# Patient Record
Sex: Male | Born: 1950
Health system: Southern US, Community
[De-identification: ages and names within clinical notes are randomized; demographics above are authoritative.]

## PROBLEM LIST (undated history)

## (undated) DIAGNOSIS — I714 Abdominal aortic aneurysm, without rupture, unspecified: Secondary | ICD-10-CM

## (undated) DIAGNOSIS — J449 Chronic obstructive pulmonary disease, unspecified: Secondary | ICD-10-CM

## (undated) DIAGNOSIS — I10 Essential (primary) hypertension: Secondary | ICD-10-CM

## (undated) DIAGNOSIS — K901 Tropical sprue: Secondary | ICD-10-CM

## (undated) DIAGNOSIS — K219 Gastro-esophageal reflux disease without esophagitis: Secondary | ICD-10-CM

## (undated) DIAGNOSIS — R0902 Hypoxemia: Secondary | ICD-10-CM

## (undated) DIAGNOSIS — K635 Polyp of colon: Secondary | ICD-10-CM

## (undated) DIAGNOSIS — K5090 Crohn's disease, unspecified, without complications: Secondary | ICD-10-CM

## (undated) DIAGNOSIS — J439 Emphysema, unspecified: Secondary | ICD-10-CM

## (undated) DIAGNOSIS — T7840XA Allergy, unspecified, initial encounter: Secondary | ICD-10-CM

## (undated) DIAGNOSIS — H3320 Serous retinal detachment, unspecified eye: Secondary | ICD-10-CM

## (undated) HISTORY — PX: TONSILLECTOMY: SUR1361

## (undated) HISTORY — DX: Polyp of colon: K63.5

## (undated) HISTORY — DX: Chronic obstructive pulmonary disease, unspecified: J44.9

## (undated) HISTORY — PX: INGUINAL HERNIA REPAIR: SUR1180

## (undated) HISTORY — DX: Crohn's disease, unspecified, without complications: K50.90

## (undated) HISTORY — DX: Gastro-esophageal reflux disease without esophagitis: K21.9

## (undated) HISTORY — DX: Essential (primary) hypertension: I10

## (undated) HISTORY — PX: COLONOSCOPY: SHX174

## (undated) HISTORY — DX: Emphysema, unspecified: J43.9

## (undated) HISTORY — DX: Tropical sprue: K90.1

## (undated) HISTORY — PX: EYE SURGERY: SHX253

## (undated) HISTORY — PX: SMALL INTESTINE SURGERY: SHX150

## (undated) HISTORY — DX: Serous retinal detachment, unspecified eye: H33.20

## (undated) HISTORY — DX: Allergy, unspecified, initial encounter: T78.40XA

## (undated) HISTORY — PX: COLON SURGERY: SHX602

## (undated) HISTORY — PX: APPENDECTOMY: SHX54

## (undated) HISTORY — DX: Hypoxemia: R09.02

---

## 1994-05-27 HISTORY — PX: BOWEL RESECTION: SHX1257

## 2006-07-27 HISTORY — PX: RETINAL DETACHMENT SURGERY: SHX105

## 2006-08-11 ENCOUNTER — Ambulatory Visit (HOSPITAL_COMMUNITY): Admission: RE | Admit: 2006-08-11 | Discharge: 2006-08-11 | Payer: Self-pay | Admitting: Internal Medicine

## 2006-09-26 HISTORY — PX: RETINAL DETACHMENT SURGERY: SHX105

## 2007-01-25 HISTORY — PX: RETINAL DETACHMENT SURGERY: SHX105

## 2007-07-11 ENCOUNTER — Emergency Department (HOSPITAL_COMMUNITY): Admission: EM | Admit: 2007-07-11 | Discharge: 2007-07-11 | Payer: Self-pay | Admitting: Emergency Medicine

## 2010-12-09 ENCOUNTER — Other Ambulatory Visit (HOSPITAL_COMMUNITY): Payer: Self-pay | Admitting: Internal Medicine

## 2010-12-09 ENCOUNTER — Encounter (HOSPITAL_COMMUNITY): Payer: Self-pay

## 2010-12-09 ENCOUNTER — Ambulatory Visit (HOSPITAL_COMMUNITY)
Admission: RE | Admit: 2010-12-09 | Discharge: 2010-12-09 | Disposition: A | Discharge status: Home / Self Care | Payer: 59 | Source: Ambulatory Visit | Attending: Internal Medicine | Admitting: Internal Medicine

## 2010-12-09 DIAGNOSIS — M79672 Pain in left foot: Secondary | ICD-10-CM

## 2010-12-09 DIAGNOSIS — R937 Abnormal findings on diagnostic imaging of other parts of musculoskeletal system: Secondary | ICD-10-CM | POA: Insufficient documentation

## 2010-12-09 DIAGNOSIS — T1490XA Injury, unspecified, initial encounter: Secondary | ICD-10-CM

## 2010-12-09 DIAGNOSIS — M25579 Pain in unspecified ankle and joints of unspecified foot: Secondary | ICD-10-CM | POA: Insufficient documentation

## 2011-10-27 DIAGNOSIS — H2702 Aphakia, left eye: Secondary | ICD-10-CM | POA: Insufficient documentation

## 2011-10-27 DIAGNOSIS — H33022 Retinal detachment with multiple breaks, left eye: Secondary | ICD-10-CM | POA: Insufficient documentation

## 2011-10-27 DIAGNOSIS — IMO0002 Reserved for concepts with insufficient information to code with codable children: Secondary | ICD-10-CM | POA: Insufficient documentation

## 2012-11-01 ENCOUNTER — Telehealth: Payer: Self-pay

## 2012-11-01 NOTE — Telephone Encounter (Signed)
Called pt. He said he is overdue for colonoscopy. Hx of polyps but not sure what kind or when last one was done. Scheduled for OV with Lorenza Burton, NP on 11/19/2012 at 11:00 AM.

## 2012-11-01 NOTE — Telephone Encounter (Signed)
Received referral from [facility] that said pt was referred to Dr. Jena Gauss for check up ( did not say for what) and possible colonoscopy. Medical records did not have anything on the patient.

## 2012-11-19 ENCOUNTER — Ambulatory Visit (INDEPENDENT_AMBULATORY_CARE_PROVIDER_SITE_OTHER): Payer: 59 | Admitting: Urgent Care

## 2012-11-19 ENCOUNTER — Encounter: Payer: Self-pay | Admitting: Urgent Care

## 2012-11-19 VITALS — BP 134/82 | HR 71 | Temp 98.0°F | Ht 66.0 in | Wt 142.2 lb

## 2012-11-19 DIAGNOSIS — Z860100 Personal history of colon polyps, unspecified: Secondary | ICD-10-CM | POA: Insufficient documentation

## 2012-11-19 MED ORDER — PEG 3350-KCL-NA BICARB-NACL 420 G PO SOLR: 4000.0000 mL | ORAL | Status: DC

## 2012-11-19 NOTE — Assessment & Plan Note (Addendum)
Martin Santiago is a pleasant 62 y.o. male with history of colonic polyps many years ago. He believes last colonoscopy was over 10 years ago. Interestingly, he gives history of possible Crohn's disease with surgical remission back in 1995. Colonoscopy with Dr. Jena Gauss in the near future.  I have discussed risks & benefits which include, but are not limited to, bleeding, infection, perforation & drug reaction.  The patient agrees with this plan & written consent will be obtained.    We will request your last colonoscopy report from [facility] gastroenterology 1-800-QUIT-NOW for help quitting smoking

## 2012-11-19 NOTE — Progress Notes (Signed)
Referring Provider:  Dr Catalina Pizza Primary Care Physician:  Dr Catalina Pizza Primary Gastroenterologist:  Dr. Jena Gauss  Chief Complaint  Patient presents with  . Colonoscopy    Hx colon polyps    HPI:  Martin Santiago is a 62 y.o. male here as a referral from Dr. Catalina Pizza for colonoscopy.  He gives history of multiple polyps removed several years ago on last colonoscopy, however he cannot remember timing of last colonoscopy. He tells me he is significantly overdue. He had a diagnostic laparoscopy & bowel resection back in 1995 and was told he had Crohn's disease.  He reportedly was on Pentasa for a limited period of time and received monthly B12 injections. Over the past 10-15 years, he has had no further problems with GI concerns and he has not been on any GI medications. He has rare nocturnal heartburn a couple times per month.  No certain foods.  He does admit to eating a snack late just before bed.  He keeps TUMS at his bedside and they do work well for him.    Denies nausea, vomiting, dysphagia, odynophagia or anorexia.  Denies constipation, diarrhea, rectal bleeding, melena or weight loss.   Past Medical History  Diagnosis Date  . Hypertension   . Tropical sprue   . Detached retina   . Colon polyps   . Crohn's disease     surgical remission?    Past Surgical History  Procedure Laterality Date  . Bowel resection  05/1994    [place]:  ? Crohn's  . Colonoscopy      Dr Clide Dales, [facility]:  Hx polyps. Cannot remmeber date  . Retinal detachment surgery  07/2006  . Retinal detachment surgery  09/2006  . Retinal detachment surgery  01/2007  . Inguinal hernia repair Right     Current Outpatient Prescriptions  Medication Sig Dispense Refill  . amLODipine (NORVASC) 10 MG tablet Take 10 mg by mouth daily.       Marland Kitchen losartan (COZAAR) 100 MG tablet Take 100 mg by mouth daily.       . polyethylene glycol-electrolytes (TRILYTE) 420 G solution Take 4,000 mLs by mouth as directed.  4000 mL  0   No  current facility-administered medications for this visit.    Allergies as of 11/19/2012  . (No Known Allergies)    Family History:There is no known family history in 1st degree relatives of colorectal carcinoma , liver disease, or inflammatory bowel disease.  Problem Relation Age of Onset  . Colon cancer Maternal Aunt   . Breast cancer Mother     History   Social History  . Marital Status: Married    Spouse Name: N/A    Number of Children: 0  . Years of Education: N/A   Occupational History  . retired, Counsellor, Neurosurgeon    Social History Main Topics  . Smoking status: Current Every Day Smoker -- 1.50 packs/day for 45 years    Types: Cigarettes  . Smokeless tobacco: Not on file  . Alcohol Use: Yes     Comment: Rare, 3-4 per yr  . Drug Use: No     Comment: remote marijuana  . Sexually Active: Not on file   Other Topics Concern  . Not on file   Social History Narrative   Lives w/ wife & step-son (43)     Review of Systems: Gen: Denies any fever, chills, sweats, anorexia, fatigue, weakness, malaise, weight loss, and sleep disorder CV: Denies chest pain, angina,  palpitations, syncope, orthopnea, PND, peripheral edema, and claudication. Resp: Denies dyspnea at rest, dyspnea with exercise, cough, sputum, wheezing, coughing up blood, and pleurisy. GI: Denies vomiting blood, jaundice, and fecal incontinence.   Denies dysphagia or odynophagia. GU : Denies urinary burning, blood in urine, urinary frequency, urinary hesitancy, nocturnal urination, and urinary incontinence. MS: Denies joint pain, limitation of movement, and swelling, stiffness, low back pain, extremity pain. Denies muscle weakness, cramps, atrophy.  Derm: Denies rash, itching, dry skin, hives, moles, warts, or unhealing ulcers.  Psych: Denies depression, anxiety, memory loss, suicidal ideation, hallucinations, paranoia, and confusion. Heme: Denies bruising, bleeding, and enlarged lymph nodes. Neuro:   Denies any headaches, dizziness, paresthesias. Endo:  Denies any problems with DM, thyroid, adrenal function.  Physical Exam: BP 134/82  Pulse 71  Temp(Src) 98 F (36.7 C) (Oral)  Ht 5\' 6"  (1.676 m)  Wt 142 lb 3.2 oz (64.501 kg)  BMI 22.96 kg/m2 No LMP for male patient. General:   Alert,  Well-developed, well-nourished, pleasant and cooperative in NAD Head:  Normocephalic and atraumatic. Eyes:  Sclera clear, no icterus.   Conjunctiva pink. Ears:  Normal auditory acuity. Nose:  No deformity, discharge, or lesions. Mouth:  No deformity or lesions,oropharynx pink & moist. Neck:  Supple; no masses or thyromegaly. Lungs:  Clear throughout to auscultation.   No wheezes, crackles, or rhonchi. No acute distress. Heart:  Regular rate and rhythm; no murmurs, clicks, rubs,  or gallops. Abdomen:  Normal bowel sounds.  No bruits.  Soft, non-tender and non-distended without masses, hepatosplenomegaly or hernias noted.  No guarding or rebound tenderness.   Rectal:  Deferred. Msk:  Symmetrical without gross deformities. Normal posture. Pulses:  Normal pulses noted. Extremities:  + clubbing.  No edema. Neurologic:  Alert and oriented x4;  grossly normal neurologically. Skin:  Intact without significant lesions or rashes. Lymph Nodes:  No significant cervical adenopathy. Psych:  Alert and cooperative. Normal mood and affect.

## 2012-11-19 NOTE — Patient Instructions (Addendum)
Colonoscopy with Dr. Jena Gauss We will request your last colonoscopy report from [facility] gastroenterology 1-800-QUIT-NOW for help quitting smoking  It was a pleasure meeting you, call if you have any problems or questions

## 2012-11-20 ENCOUNTER — Encounter (HOSPITAL_COMMUNITY): Payer: Self-pay | Admitting: Pharmacy Technician

## 2012-11-20 NOTE — Progress Notes (Signed)
Faxed to PCP

## 2012-11-30 NOTE — Progress Notes (Signed)
Note from [facility]-No colon or path report in archives as it was too long ago.

## 2012-12-03 ENCOUNTER — Encounter (HOSPITAL_COMMUNITY): Payer: Self-pay | Admitting: *Deleted

## 2012-12-03 ENCOUNTER — Ambulatory Visit (HOSPITAL_COMMUNITY)
Admission: RE | Admit: 2012-12-03 | Discharge: 2012-12-03 | Disposition: A | Discharge status: Home / Self Care | Payer: 59 | Source: Ambulatory Visit | Attending: Internal Medicine | Admitting: Internal Medicine

## 2012-12-03 ENCOUNTER — Encounter (HOSPITAL_COMMUNITY)
Admission: RE | Disposition: A | Discharge status: Home / Self Care | Payer: Self-pay | Source: Ambulatory Visit | Attending: Internal Medicine

## 2012-12-03 DIAGNOSIS — Z860100 Personal history of colon polyps, unspecified: Secondary | ICD-10-CM | POA: Insufficient documentation

## 2012-12-03 DIAGNOSIS — D126 Benign neoplasm of colon, unspecified: Secondary | ICD-10-CM | POA: Insufficient documentation

## 2012-12-03 DIAGNOSIS — I10 Essential (primary) hypertension: Secondary | ICD-10-CM | POA: Insufficient documentation

## 2012-12-03 DIAGNOSIS — Z9049 Acquired absence of other specified parts of digestive tract: Secondary | ICD-10-CM | POA: Insufficient documentation

## 2012-12-03 DIAGNOSIS — K5730 Diverticulosis of large intestine without perforation or abscess without bleeding: Secondary | ICD-10-CM | POA: Insufficient documentation

## 2012-12-03 DIAGNOSIS — Z1211 Encounter for screening for malignant neoplasm of colon: Secondary | ICD-10-CM

## 2012-12-03 HISTORY — PX: COLONOSCOPY: SHX5424

## 2012-12-03 SURGERY — COLONOSCOPY
Anesthesia: Moderate Sedation

## 2012-12-03 MED ORDER — ONDANSETRON HCL 4 MG/2ML IJ SOLN
INTRAMUSCULAR | Status: AC
  Filled 2012-12-03: qty 2

## 2012-12-03 MED ORDER — MIDAZOLAM HCL 5 MG/5ML IJ SOLN
INTRAMUSCULAR | Status: DC | PRN
  Administered 2012-12-03 (×2): 1 mg via INTRAVENOUS
  Administered 2012-12-03: 2 mg via INTRAVENOUS

## 2012-12-03 MED ORDER — SODIUM CHLORIDE 0.45 % IV SOLN
INTRAVENOUS | Status: DC
  Administered 2012-12-03: 07:00:00 via INTRAVENOUS

## 2012-12-03 MED ORDER — MIDAZOLAM HCL 5 MG/5ML IJ SOLN
INTRAMUSCULAR | Status: AC
  Filled 2012-12-03: qty 10

## 2012-12-03 MED ORDER — MEPERIDINE HCL 100 MG/ML IJ SOLN
INTRAMUSCULAR | Status: AC
  Filled 2012-12-03: qty 2

## 2012-12-03 MED ORDER — ONDANSETRON HCL 4 MG/2ML IJ SOLN
INTRAMUSCULAR | Status: DC | PRN
  Administered 2012-12-03: 4 mg via INTRAVENOUS

## 2012-12-03 MED ORDER — MEPERIDINE HCL 100 MG/ML IJ SOLN
INTRAMUSCULAR | Status: DC | PRN
  Administered 2012-12-03 (×2): 25 mg via INTRAVENOUS
  Administered 2012-12-03: 50 mg via INTRAVENOUS

## 2012-12-03 MED ORDER — SIMETHICONE 40 MG/0.6ML PO SUSP
ORAL | Status: DC | PRN
  Administered 2012-12-03: 07:00:00

## 2012-12-03 NOTE — Op Note (Signed)
[facility] [address] [place], [address]   COLONOSCOPY PROCEDURE REPORT  PATIENT: Martin Santiago, Martin Santiago  MR#:         [id number] BIRTHDATE: July 03, 1951 , 61  yrs. old GENDER: Male ENDOSCOPIST: R.  Roetta Sessions, MD FACP FACG REFERRED BY:  Catalina Pizza, M.D. PROCEDURE DATE:  12/03/2012 PROCEDURE:     Ileocolonoscopy with snare polypectomy  INDICATIONS: Distant history of colonic polyps-overdue for surveillance examination; reported history of surgical resection for Crohn's.  INFORMED CONSENT:  The risks, benefits, alternatives and imponderables including but not limited to bleeding, perforation as well as the possibility of a missed lesion have been reviewed.  The potential for biopsy, lesion removal, etc. have also been discussed.  Questions have been answered.  All parties agreeable. Please see the history and physical in the medical record for more information.  MEDICATIONS: Versed 4 mg IV and Demerol 100 mg IV in divided doses. Zofran 4 mg IV  DESCRIPTION OF PROCEDURE:  After a digital rectal exam was performed, the EC-3890Li ([id number])  colonoscope was advanced from the anus through the rectum and colon to the area of the cecum, ileocecal valve and appendiceal orifice.  The cecum was deeply intubated.  These structures were well-seen and photographed for the record.  From the level of the cecum and ileocecal valve, the scope was slowly and cautiously withdrawn.  The mucosal surfaces were carefully surveyed utilizing scope tip deflection to facilitate fold flattening as needed.  The scope was pulled down into the rectum where a thorough examination including retroflexion was performed.    FINDINGS:  Adequate preparation. Normal rectum (minimal internal hemorrhoids).  scattered sigmoid diverticula; (1) 5 mm polyp in the mid ascending and (1) 6 mm sessile polyp in the mid descending segment; otherwise, the remainder of the colonic mucosa to the cecum appeared  normal. Appendiceal orifice was identified. Patient had intact ileocecal valve.  The distal 10 cm of terminal ileal mucosa appeared normal.   THERAPEUTIC / DIAGNOSTIC MANEUVERS PERFORMED:  The 2 polyps seenwere hot snare removed  COMPLICATIONS: none  CECAL WITHDRAWAL TIME:  19 minutes  IMPRESSION:  Colonic diverticulosis. Colonic polyps-removed described above. No evidence of prior ileocolonic surgery and no evidence of ileocolonic Crohn's disease on today's examination.  RECOMMENDATIONS: Followup on pathology.   _______________________________ eSigned:  R. Roetta Sessions, MD FACP [place] 12/03/2012 9:03 AM   CC:    PATIENT NAME:  Martin Santiago, Martin Santiago MR#: [id number]

## 2012-12-03 NOTE — Interval H&P Note (Signed)
History and Physical Interval Note:  12/03/2012 7:46 AM  Martin Santiago  has presented today for surgery, with the diagnosis of HISTORY OF COLON POLYPS  The various methods of treatment have been discussed with the patient and family. After consideration of risks, benefits and other options for treatment, the patient has consented to  Procedure(s) with comments: COLONOSCOPY (N/A) - 7:30 as a surgical intervention .  The patient's history has been reviewed, patient examined, no change in status, stable for surgery.  I have reviewed the patient's chart and labs.  Questions were answered to the patient's satisfaction.     Eula Listen  Colonoscopy per plan.. The risks, benefits, limitations, alternatives and imponderables have been reviewed with the patient. Questions have been answered. All parties are agreeable.

## 2012-12-03 NOTE — H&P (View-Only) (Signed)
Referring Provider:  Dr Zach Hall Primary Care Physician:  Dr Zach Hall Primary Gastroenterologist:  Dr. Rourk  Chief Complaint  Patient presents with  . Colonoscopy    Hx colon polyps    HPI:  Martin Santiago is a 62 y.o. male here as a referral from Dr. Zach Hall for colonoscopy.  He gives history of multiple polyps removed several years ago on last colonoscopy, however he cannot remember timing of last colonoscopy. He tells me he is significantly overdue. He had a diagnostic laparoscopy & bowel resection back in 1995 and was told he had Crohn's disease.  He reportedly was on Pentasa for a limited period of time and received monthly B12 injections. Over the past 10-15 years, he has had no further problems with GI concerns and he has not been on any GI medications. He has rare nocturnal heartburn a couple times per month.  No certain foods.  He does admit to eating a snack late just before bed.  He keeps TUMS at his bedside and they do work well for him.    Denies nausea, vomiting, dysphagia, odynophagia or anorexia.  Denies constipation, diarrhea, rectal bleeding, melena or weight loss.   Past Medical History  Diagnosis Date  . Hypertension   . Tropical sprue   . Detached retina   . Colon polyps   . Crohn's disease     surgical remission?    Past Surgical History  Procedure Laterality Date  . Bowel resection  05/1994    [place]:  ? Crohn's  . Colonoscopy      Dr Reuben, [facility]:  Hx polyps. Cannot remmeber date  . Retinal detachment surgery  07/2006  . Retinal detachment surgery  09/2006  . Retinal detachment surgery  01/2007  . Inguinal hernia repair Right     Current Outpatient Prescriptions  Medication Sig Dispense Refill  . amLODipine (NORVASC) 10 MG tablet Take 10 mg by mouth daily.       . losartan (COZAAR) 100 MG tablet Take 100 mg by mouth daily.       . polyethylene glycol-electrolytes (TRILYTE) 420 G solution Take 4,000 mLs by mouth as directed.  4000 mL  0   No  current facility-administered medications for this visit.    Allergies as of 11/19/2012  . (No Known Allergies)    Family History:There is no known family history in 1st degree relatives of colorectal carcinoma , liver disease, or inflammatory bowel disease.  Problem Relation Age of Onset  . Colon cancer Maternal Aunt   . Breast cancer Mother     History   Social History  . Marital Status: Married    Spouse Name: N/A    Number of Children: 0  . Years of Education: N/A   Occupational History  . retired, printer, church organist    Social History Main Topics  . Smoking status: Current Every Day Smoker -- 1.50 packs/day for 45 years    Types: Cigarettes  . Smokeless tobacco: Not on file  . Alcohol Use: Yes     Comment: Rare, 3-4 per yr  . Drug Use: No     Comment: remote marijuana  . Sexually Active: Not on file   Other Topics Concern  . Not on file   Social History Narrative   Lives w/ wife & step-son (43)     Review of Systems: Gen: Denies any fever, chills, sweats, anorexia, fatigue, weakness, malaise, weight loss, and sleep disorder CV: Denies chest pain, angina,   palpitations, syncope, orthopnea, PND, peripheral edema, and claudication. Resp: Denies dyspnea at rest, dyspnea with exercise, cough, sputum, wheezing, coughing up blood, and pleurisy. GI: Denies vomiting blood, jaundice, and fecal incontinence.   Denies dysphagia or odynophagia. GU : Denies urinary burning, blood in urine, urinary frequency, urinary hesitancy, nocturnal urination, and urinary incontinence. MS: Denies joint pain, limitation of movement, and swelling, stiffness, low back pain, extremity pain. Denies muscle weakness, cramps, atrophy.  Derm: Denies rash, itching, dry skin, hives, moles, warts, or unhealing ulcers.  Psych: Denies depression, anxiety, memory loss, suicidal ideation, hallucinations, paranoia, and confusion. Heme: Denies bruising, bleeding, and enlarged lymph nodes. Neuro:   Denies any headaches, dizziness, paresthesias. Endo:  Denies any problems with DM, thyroid, adrenal function.  Physical Exam: BP 134/82  Pulse 71  Temp(Src) 98 F (36.7 C) (Oral)  Ht 5' 6" (1.676 m)  Wt 142 lb 3.2 oz (64.501 kg)  BMI 22.96 kg/m2 No LMP for male patient. General:   Alert,  Well-developed, well-nourished, pleasant and cooperative in NAD Head:  Normocephalic and atraumatic. Eyes:  Sclera clear, no icterus.   Conjunctiva pink. Ears:  Normal auditory acuity. Nose:  No deformity, discharge, or lesions. Mouth:  No deformity or lesions,oropharynx pink & moist. Neck:  Supple; no masses or thyromegaly. Lungs:  Clear throughout to auscultation.   No wheezes, crackles, or rhonchi. No acute distress. Heart:  Regular rate and rhythm; no murmurs, clicks, rubs,  or gallops. Abdomen:  Normal bowel sounds.  No bruits.  Soft, non-tender and non-distended without masses, hepatosplenomegaly or hernias noted.  No guarding or rebound tenderness.   Rectal:  Deferred. Msk:  Symmetrical without gross deformities. Normal posture. Pulses:  Normal pulses noted. Extremities:  + clubbing.  No edema. Neurologic:  Alert and oriented x4;  grossly normal neurologically. Skin:  Intact without significant lesions or rashes. Lymph Nodes:  No significant cervical adenopathy. Psych:  Alert and cooperative. Normal mood and affect.  

## 2012-12-05 ENCOUNTER — Encounter: Payer: Self-pay | Admitting: *Deleted

## 2012-12-05 ENCOUNTER — Encounter (HOSPITAL_COMMUNITY): Payer: Self-pay | Admitting: Internal Medicine

## 2012-12-05 ENCOUNTER — Encounter: Payer: Self-pay | Admitting: Internal Medicine

## 2014-09-29 ENCOUNTER — Ambulatory Visit (HOSPITAL_COMMUNITY)
Admission: RE | Admit: 2014-09-29 | Discharge: 2014-09-29 | Disposition: A | Discharge status: Home / Self Care | Payer: No Typology Code available for payment source | Source: Ambulatory Visit | Attending: Internal Medicine | Admitting: Internal Medicine

## 2014-09-29 ENCOUNTER — Other Ambulatory Visit (HOSPITAL_COMMUNITY): Payer: Self-pay | Admitting: Internal Medicine

## 2014-09-29 DIAGNOSIS — R609 Edema, unspecified: Secondary | ICD-10-CM

## 2014-09-29 DIAGNOSIS — M25532 Pain in left wrist: Secondary | ICD-10-CM | POA: Insufficient documentation

## 2014-09-29 DIAGNOSIS — M7989 Other specified soft tissue disorders: Secondary | ICD-10-CM | POA: Insufficient documentation

## 2014-09-29 DIAGNOSIS — R52 Pain, unspecified: Secondary | ICD-10-CM

## 2014-09-29 DIAGNOSIS — M13842 Other specified arthritis, left hand: Secondary | ICD-10-CM | POA: Insufficient documentation

## 2015-01-01 DIAGNOSIS — H40002 Preglaucoma, unspecified, left eye: Secondary | ICD-10-CM | POA: Diagnosis not present

## 2015-01-01 DIAGNOSIS — H2511 Age-related nuclear cataract, right eye: Secondary | ICD-10-CM | POA: Diagnosis not present

## 2015-01-01 DIAGNOSIS — H33022 Retinal detachment with multiple breaks, left eye: Secondary | ICD-10-CM | POA: Diagnosis not present

## 2015-01-01 DIAGNOSIS — H2702 Aphakia, left eye: Secondary | ICD-10-CM | POA: Diagnosis not present

## 2015-07-20 DIAGNOSIS — I10 Essential (primary) hypertension: Secondary | ICD-10-CM | POA: Diagnosis not present

## 2015-07-22 DIAGNOSIS — Z23 Encounter for immunization: Secondary | ICD-10-CM | POA: Diagnosis not present

## 2015-07-22 DIAGNOSIS — Z720 Tobacco use: Secondary | ICD-10-CM | POA: Diagnosis not present

## 2015-07-22 DIAGNOSIS — I10 Essential (primary) hypertension: Secondary | ICD-10-CM | POA: Diagnosis not present

## 2015-09-03 DIAGNOSIS — D225 Melanocytic nevi of trunk: Secondary | ICD-10-CM | POA: Diagnosis not present

## 2015-09-03 DIAGNOSIS — L821 Other seborrheic keratosis: Secondary | ICD-10-CM | POA: Diagnosis not present

## 2015-09-03 DIAGNOSIS — I781 Nevus, non-neoplastic: Secondary | ICD-10-CM | POA: Diagnosis not present

## 2015-09-03 DIAGNOSIS — B079 Viral wart, unspecified: Secondary | ICD-10-CM | POA: Diagnosis not present

## 2015-12-07 ENCOUNTER — Other Ambulatory Visit (HOSPITAL_COMMUNITY): Payer: Self-pay | Admitting: Internal Medicine

## 2015-12-07 ENCOUNTER — Ambulatory Visit (HOSPITAL_COMMUNITY)
Admission: RE | Admit: 2015-12-07 | Discharge: 2015-12-07 | Disposition: A | Discharge status: Home / Self Care | Payer: Medicare Other | Source: Ambulatory Visit | Attending: Internal Medicine | Admitting: Internal Medicine

## 2015-12-07 DIAGNOSIS — M419 Scoliosis, unspecified: Secondary | ICD-10-CM | POA: Insufficient documentation

## 2015-12-07 DIAGNOSIS — R05 Cough: Secondary | ICD-10-CM

## 2015-12-07 DIAGNOSIS — R0602 Shortness of breath: Secondary | ICD-10-CM | POA: Diagnosis present

## 2015-12-07 DIAGNOSIS — R059 Cough, unspecified: Secondary | ICD-10-CM

## 2015-12-07 DIAGNOSIS — M545 Low back pain, unspecified: Secondary | ICD-10-CM

## 2015-12-07 DIAGNOSIS — M5136 Other intervertebral disc degeneration, lumbar region: Secondary | ICD-10-CM | POA: Insufficient documentation

## 2015-12-07 DIAGNOSIS — R0902 Hypoxemia: Secondary | ICD-10-CM | POA: Diagnosis not present

## 2015-12-07 DIAGNOSIS — M79605 Pain in left leg: Secondary | ICD-10-CM

## 2015-12-07 DIAGNOSIS — Z720 Tobacco use: Secondary | ICD-10-CM | POA: Diagnosis not present

## 2015-12-07 DIAGNOSIS — J449 Chronic obstructive pulmonary disease, unspecified: Secondary | ICD-10-CM | POA: Insufficient documentation

## 2015-12-07 DIAGNOSIS — R509 Fever, unspecified: Secondary | ICD-10-CM | POA: Diagnosis not present

## 2015-12-07 DIAGNOSIS — J441 Chronic obstructive pulmonary disease with (acute) exacerbation: Secondary | ICD-10-CM | POA: Diagnosis not present

## 2015-12-07 DIAGNOSIS — I959 Hypotension, unspecified: Secondary | ICD-10-CM | POA: Diagnosis not present

## 2015-12-15 ENCOUNTER — Other Ambulatory Visit (HOSPITAL_COMMUNITY): Payer: Self-pay | Admitting: Internal Medicine

## 2015-12-15 DIAGNOSIS — R9389 Abnormal findings on diagnostic imaging of other specified body structures: Secondary | ICD-10-CM

## 2015-12-28 DIAGNOSIS — Z720 Tobacco use: Secondary | ICD-10-CM | POA: Diagnosis not present

## 2015-12-28 DIAGNOSIS — J441 Chronic obstructive pulmonary disease with (acute) exacerbation: Secondary | ICD-10-CM | POA: Diagnosis not present

## 2015-12-28 DIAGNOSIS — Z716 Tobacco abuse counseling: Secondary | ICD-10-CM | POA: Diagnosis not present

## 2015-12-28 DIAGNOSIS — R0902 Hypoxemia: Secondary | ICD-10-CM | POA: Diagnosis not present

## 2016-01-07 DIAGNOSIS — H4052X3 Glaucoma secondary to other eye disorders, left eye, severe stage: Secondary | ICD-10-CM | POA: Diagnosis not present

## 2016-01-07 DIAGNOSIS — H33022 Retinal detachment with multiple breaks, left eye: Secondary | ICD-10-CM | POA: Diagnosis not present

## 2016-01-07 DIAGNOSIS — H2702 Aphakia, left eye: Secondary | ICD-10-CM | POA: Diagnosis not present

## 2016-01-07 DIAGNOSIS — H2511 Age-related nuclear cataract, right eye: Secondary | ICD-10-CM | POA: Diagnosis not present

## 2016-01-13 ENCOUNTER — Ambulatory Visit (HOSPITAL_COMMUNITY)
Admission: RE | Admit: 2016-01-13 | Discharge: 2016-01-13 | Disposition: A | Discharge status: Home / Self Care | Payer: Medicare Other | Source: Ambulatory Visit | Attending: Internal Medicine | Admitting: Internal Medicine

## 2016-01-13 DIAGNOSIS — J439 Emphysema, unspecified: Secondary | ICD-10-CM | POA: Diagnosis not present

## 2016-01-13 DIAGNOSIS — R938 Abnormal findings on diagnostic imaging of other specified body structures: Secondary | ICD-10-CM | POA: Insufficient documentation

## 2016-01-13 DIAGNOSIS — R918 Other nonspecific abnormal finding of lung field: Secondary | ICD-10-CM | POA: Insufficient documentation

## 2016-01-13 DIAGNOSIS — D3501 Benign neoplasm of right adrenal gland: Secondary | ICD-10-CM | POA: Diagnosis not present

## 2016-01-13 DIAGNOSIS — I2510 Atherosclerotic heart disease of native coronary artery without angina pectoris: Secondary | ICD-10-CM | POA: Insufficient documentation

## 2016-01-13 DIAGNOSIS — J432 Centrilobular emphysema: Secondary | ICD-10-CM | POA: Insufficient documentation

## 2016-01-13 DIAGNOSIS — R9389 Abnormal findings on diagnostic imaging of other specified body structures: Secondary | ICD-10-CM

## 2016-02-25 ENCOUNTER — Ambulatory Visit (INDEPENDENT_AMBULATORY_CARE_PROVIDER_SITE_OTHER): Payer: Medicare Other | Admitting: Cardiology

## 2016-02-25 ENCOUNTER — Encounter: Payer: Self-pay | Admitting: Cardiology

## 2016-02-25 VITALS — BP 150/100 | HR 41 | Ht 68.0 in | Wt 133.0 lb

## 2016-02-25 DIAGNOSIS — Z136 Encounter for screening for cardiovascular disorders: Secondary | ICD-10-CM

## 2016-02-25 DIAGNOSIS — Z720 Tobacco use: Secondary | ICD-10-CM

## 2016-02-25 DIAGNOSIS — I10 Essential (primary) hypertension: Secondary | ICD-10-CM

## 2016-02-25 DIAGNOSIS — I2510 Atherosclerotic heart disease of native coronary artery without angina pectoris: Secondary | ICD-10-CM | POA: Diagnosis not present

## 2016-02-25 NOTE — Patient Instructions (Signed)
Your physician wants you to follow-up in: 1 year You will receive a reminder letter in the mail two months in advance. If you don't receive a letter, please call our office to schedule the follow-up appointment.    Your physician recommends that you continue on your current medications as directed. Please refer to the Current Medication list given to you today.   If you need a refill on your cardiac medications before your next appointment, please call your pharmacy.   Your physician has requested that you have a lexiscan myoview. For further information please visit [email or web address]. Please follow instruction sheet, as given.     Thank you for choosing [facility] !

## 2016-02-25 NOTE — Progress Notes (Signed)
Cardiology Office Note  Date: 02/25/2016   ID: Martin Santiago, DOB 1951-06-14, MRN [id number]  PCP: Wende Neighbors, MD  Consulting Cardiologist: Rozann Lesches, MD   Chief Complaint  Patient presents with  . Coronary atherosclerosis by chest CT    History of Present Illness: Martin Santiago is a 65 y.o. male referred for cardiology consultation by Dr. Nevada Crane. He had a recent chest CT to follow up on COPD exacerbation and suspected pneumonia, and the study reported incidentally noted multivessel distribution coronary atherosclerosis. He states that the respiratory symptoms have improved, and sounds like he was fairly sick for a few weeks with worsening shortness of breath, cough, and hypotension. He was treated as an outpatient, has been off his antihypertensive medications.  He states that he is not particularly active, does not exercise. 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 He reports chronic dyspnea on exertion, generally NYHA class 2-3 depending on level of activity. He does not have any exertional chest discomfort.  He has a long-standing history of tobacco abuse, we discussed this today. He states that he quit for 3 days with hypnosis several years ago, but started smoking again and and has not wanted to quit since then.  ECG today shows sinus rhythm with PVC and PAC, leftward axis.  Past Medical History  Diagnosis Date  . Essential hypertension   . Tropical sprue   . Detached retina   . Colon polyps   . Crohn's disease ([place])   . COPD (chronic obstructive pulmonary disease) [facility])     Past Surgical History  Procedure Laterality Date  . Bowel resection  05/1994    [place]:  ? Crohn's  . Colonoscopy      Dr Derrill Center, [facility]:  Hx polyps. Cannot remmeber date  . Retinal detachment surgery  07/2006  . Retinal detachment surgery  09/2006  . Retinal detachment surgery  01/2007  . Inguinal hernia repair Right     . Colonoscopy N/A 12/03/2012    Procedure: COLONOSCOPY;  Surgeon: Daneil Dolin, MD;  Location: [facility];  Service: Endoscopy;  Laterality: N/A;  7:30    Current Outpatient Prescriptions  Medication Sig Dispense Refill  . aspirin EC 81 MG tablet Take 81 mg by mouth daily.    Marland Kitchen amLODipine (NORVASC) 10 MG tablet Take 10 mg by mouth daily. Reported on 02/25/2016    . losartan (COZAAR) 100 MG tablet Take 100 mg by mouth daily. Reported on 02/25/2016     No current facility-administered medications for this visit.   Allergies:  Codeine   Social History: The patient  reports that he has been smoking Cigarettes.  He has a 67.5 pack-year smoking history. He does not have any smokeless tobacco history on file. He reports that he drinks alcohol. He reports that he does not use illicit drugs.   Family History: The patient's family history includes Breast cancer in his mother; Colon cancer in his maternal aunt.   ROS:  Please see the history of present illness. Otherwise, complete review of systems is positive for chronic dyspnea exertion and seasonal allergies.  All other systems are reviewed and negative.   Physical Exam: VS:  BP 150/100 mmHg  Pulse 41  Ht 5\' 8"  (1.727 m)  Wt 133 lb (60.328 kg)  BMI 20.23 kg/m2  SpO2 91%, BMI Body mass index is 20.23 kg/(m^2).  Wt Readings from Last 3 Encounters:  02/25/16  133 lb (60.328 kg)  12/03/12 142 lb (64.411 kg)  11/19/12 142 lb 3.2 oz (64.501 kg)    General: Thin male, appears comfortable at rest. HEENT: Conjunctiva and lids normal, oropharynx clear. Neck: Supple, no elevated JVP or carotid bruits, no thyromegaly. Lungs: Decreased breath sounds throughout without wheezing, nonlabored breathing at rest. Cardiac: Regular rate and rhythm, no S3 or significant systolic murmur, no pericardial rub. Abdomen: Soft, nontender, bowel sounds present, no guarding or rebound. Extremities: No pitting edema, distal pulses 2+. Skin: Warm and  dry. Musculoskeletal: No kyphosis. Neuropsychiatric: Alert and oriented x3, affect grossly appropriate.  ECG: No old tracing available for comparison.  Other Studies Reviewed Today:  Chest CT 01/13/2016: IMPRESSION: 1. Moderate to severe centrilobular emphysema. No evidence of interstitial lung disease. 2. Basilar subpleural nodules are likely subpleural lymph nodes. Non-contrast chest CT at 3-6 months is recommended. If the nodules are stable at time of repeat CT, then future CT at 18-24 months (from today's scan) is considered optional for low-risk patients, but is recommended for high-risk patients. This recommendation follows the consensus statement: Guidelines for Management of Incidental Pulmonary Nodules Detected on CT Images:From the Fleischner Society 2017; published online before print (10.1148/radiol.SG:5268862). 3. Right adrenal adenoma. 4. Three-vessel coronary artery calcification.  Assessment and Plan:  1. Coronary atherosclerosis based on recent chest CT imaging. Patient has a long-standing history of tobacco abuse, also hypertension. We discussed the implications and I have recommended a Lexiscan Cardiolite to assess ischemic burden and LVEF. I recommended that he continue taking aspirin daily (has not been doing this regularly), and also resume his antihypertensives one at a time since blood pressure has trended back up. He needs to keep follow-up with Dr. Nevada Crane.  2. Long-standing tobacco abuse, patient is very frank and discussing no motivation to quit. I have explained this is a risk factor not only for progressive lung disease but also heart disease.  3. Essential hypertension. Recently off Cozaar and Norvasc due to hypotension with pneumonia. Blood pressure is elevated today. I recommended that he start back on at least one of these medications and then follow-up with Dr. Nevada Crane.  Current medicines were reviewed with the patient today.   Orders Placed This  Encounter  Procedures  . NM Myocar Multi W/Spect W/Wall Motion / EF  . EKG 12-Lead    Disposition: FU with me in 1 year.   Signed, Satira Sark, MD, [facility] 02/25/2016 10:59 AM    [facility] [address], [place], [place] [address] Phone: [phone]; Fax: [phone]

## 2016-03-07 ENCOUNTER — Inpatient Hospital Stay (HOSPITAL_COMMUNITY): Admission: RE | Admit: 2016-03-07 | Payer: Medicare Other | Source: Ambulatory Visit

## 2016-03-07 ENCOUNTER — Encounter (HOSPITAL_COMMUNITY)
Admission: RE | Admit: 2016-03-07 | Discharge: 2016-03-07 | Disposition: A | Discharge status: Home / Self Care | Payer: Medicare Other | Source: Ambulatory Visit | Attending: Cardiology | Admitting: Cardiology

## 2016-03-07 ENCOUNTER — Encounter (HOSPITAL_COMMUNITY): Payer: Self-pay

## 2016-03-07 DIAGNOSIS — R931 Abnormal findings on diagnostic imaging of heart and coronary circulation: Secondary | ICD-10-CM | POA: Insufficient documentation

## 2016-03-07 DIAGNOSIS — I2510 Atherosclerotic heart disease of native coronary artery without angina pectoris: Secondary | ICD-10-CM | POA: Diagnosis not present

## 2016-03-07 LAB — NM MYOCAR MULTI W/SPECT W/WALL MOTION / EF
CHL CUP NUCLEAR SRS: 1
CHL CUP RESTING HR STRESS: 65 {beats}/min
LV dias vol: 79 mL (ref 62–150)
LV sys vol: 35 mL
NUC STRESS TID: 0.99
Peak HR: 102 {beats}/min
RATE: 0.3
SDS: 2
SSS: 3

## 2016-03-07 MED ORDER — SODIUM CHLORIDE 0.9% FLUSH
INTRAVENOUS | Status: AC
  Administered 2016-03-07: 10 mL via INTRAVENOUS
  Filled 2016-03-07: qty 10

## 2016-03-07 MED ORDER — REGADENOSON 0.4 MG/5ML IV SOLN
INTRAVENOUS | Status: AC
  Administered 2016-03-07: 0.4 mg via INTRAVENOUS
  Filled 2016-03-07: qty 5

## 2016-03-07 MED ORDER — TECHNETIUM TC 99M TETROFOSMIN IV KIT
30.0000 | PACK | Freq: Once | INTRAVENOUS | Status: AC | PRN
  Administered 2016-03-07: 30 via INTRAVENOUS

## 2016-03-07 MED ORDER — TECHNETIUM TC 99M TETROFOSMIN IV KIT
10.0000 | PACK | Freq: Once | INTRAVENOUS | Status: AC | PRN
  Administered 2016-03-07: 10 via INTRAVENOUS

## 2016-05-26 DIAGNOSIS — E782 Mixed hyperlipidemia: Secondary | ICD-10-CM | POA: Diagnosis not present

## 2016-05-31 DIAGNOSIS — I10 Essential (primary) hypertension: Secondary | ICD-10-CM | POA: Diagnosis not present

## 2016-05-31 DIAGNOSIS — J449 Chronic obstructive pulmonary disease, unspecified: Secondary | ICD-10-CM | POA: Diagnosis not present

## 2016-05-31 DIAGNOSIS — R0981 Nasal congestion: Secondary | ICD-10-CM | POA: Diagnosis not present

## 2016-05-31 DIAGNOSIS — R05 Cough: Secondary | ICD-10-CM | POA: Diagnosis not present

## 2016-05-31 DIAGNOSIS — Z720 Tobacco use: Secondary | ICD-10-CM | POA: Diagnosis not present

## 2016-05-31 DIAGNOSIS — I25119 Atherosclerotic heart disease of native coronary artery with unspecified angina pectoris: Secondary | ICD-10-CM | POA: Diagnosis not present

## 2017-01-12 DIAGNOSIS — H2702 Aphakia, left eye: Secondary | ICD-10-CM | POA: Diagnosis not present

## 2017-01-12 DIAGNOSIS — H33022 Retinal detachment with multiple breaks, left eye: Secondary | ICD-10-CM | POA: Diagnosis not present

## 2017-01-12 DIAGNOSIS — H4052X3 Glaucoma secondary to other eye disorders, left eye, severe stage: Secondary | ICD-10-CM | POA: Diagnosis not present

## 2017-01-12 DIAGNOSIS — H25811 Combined forms of age-related cataract, right eye: Secondary | ICD-10-CM | POA: Diagnosis not present

## 2017-02-15 DIAGNOSIS — Z720 Tobacco use: Secondary | ICD-10-CM | POA: Diagnosis not present

## 2017-02-15 DIAGNOSIS — I25119 Atherosclerotic heart disease of native coronary artery with unspecified angina pectoris: Secondary | ICD-10-CM | POA: Diagnosis not present

## 2017-02-15 DIAGNOSIS — J449 Chronic obstructive pulmonary disease, unspecified: Secondary | ICD-10-CM | POA: Diagnosis not present

## 2017-02-15 DIAGNOSIS — I10 Essential (primary) hypertension: Secondary | ICD-10-CM | POA: Diagnosis not present

## 2017-02-15 DIAGNOSIS — Z6820 Body mass index (BMI) 20.0-20.9, adult: Secondary | ICD-10-CM | POA: Diagnosis not present

## 2017-02-15 DIAGNOSIS — Z0000 Encounter for general adult medical examination without abnormal findings: Secondary | ICD-10-CM | POA: Diagnosis not present

## 2017-04-21 ENCOUNTER — Other Ambulatory Visit: Payer: Self-pay

## 2017-06-13 ENCOUNTER — Inpatient Hospital Stay (HOSPITAL_COMMUNITY)
Admission: EM | Admit: 2017-06-13 | Discharge: 2017-06-15 | DRG: 190 | Disposition: A | Discharge status: Home / Self Care | Payer: Medicare Other | Attending: Internal Medicine | Admitting: Internal Medicine

## 2017-06-13 ENCOUNTER — Emergency Department (HOSPITAL_COMMUNITY): Payer: Medicare Other

## 2017-06-13 ENCOUNTER — Encounter (HOSPITAL_COMMUNITY): Payer: Self-pay | Admitting: Emergency Medicine

## 2017-06-13 DIAGNOSIS — J441 Chronic obstructive pulmonary disease with (acute) exacerbation: Secondary | ICD-10-CM | POA: Diagnosis not present

## 2017-06-13 DIAGNOSIS — J9601 Acute respiratory failure with hypoxia: Secondary | ICD-10-CM | POA: Diagnosis not present

## 2017-06-13 DIAGNOSIS — Z9049 Acquired absence of other specified parts of digestive tract: Secondary | ICD-10-CM | POA: Diagnosis not present

## 2017-06-13 DIAGNOSIS — J449 Chronic obstructive pulmonary disease, unspecified: Secondary | ICD-10-CM | POA: Diagnosis present

## 2017-06-13 DIAGNOSIS — F17210 Nicotine dependence, cigarettes, uncomplicated: Secondary | ICD-10-CM | POA: Diagnosis present

## 2017-06-13 DIAGNOSIS — Z23 Encounter for immunization: Secondary | ICD-10-CM | POA: Diagnosis not present

## 2017-06-13 DIAGNOSIS — I10 Essential (primary) hypertension: Secondary | ICD-10-CM | POA: Diagnosis present

## 2017-06-13 DIAGNOSIS — Z7982 Long term (current) use of aspirin: Secondary | ICD-10-CM

## 2017-06-13 DIAGNOSIS — R0602 Shortness of breath: Secondary | ICD-10-CM | POA: Diagnosis not present

## 2017-06-13 DIAGNOSIS — K5090 Crohn's disease, unspecified, without complications: Secondary | ICD-10-CM | POA: Diagnosis present

## 2017-06-13 DIAGNOSIS — Z720 Tobacco use: Secondary | ICD-10-CM | POA: Diagnosis present

## 2017-06-13 DIAGNOSIS — Z79899 Other long term (current) drug therapy: Secondary | ICD-10-CM

## 2017-06-13 DIAGNOSIS — J42 Unspecified chronic bronchitis: Secondary | ICD-10-CM | POA: Diagnosis not present

## 2017-06-13 LAB — CBC WITH DIFFERENTIAL/PLATELET
Basophils Absolute: 0 10*3/uL (ref 0.0–0.1)
Basophils Relative: 0 %
EOS ABS: 0.2 10*3/uL (ref 0.0–0.7)
Eosinophils Relative: 2 %
HCT: 48 % (ref 39.0–52.0)
HEMOGLOBIN: 16.2 g/dL (ref 13.0–17.0)
LYMPHS ABS: 1.1 10*3/uL (ref 0.7–4.0)
Lymphocytes Relative: 11 %
MCH: 31.5 pg (ref 26.0–34.0)
MCHC: 33.8 g/dL (ref 30.0–36.0)
MCV: 93.2 fL (ref 78.0–100.0)
Monocytes Absolute: 1 10*3/uL (ref 0.1–1.0)
Monocytes Relative: 10 %
Neutro Abs: 7.7 10*3/uL (ref 1.7–7.7)
Neutrophils Relative %: 77 %
Platelets: 205 10*3/uL (ref 150–400)
RBC: 5.15 MIL/uL (ref 4.22–5.81)
RDW: 14.2 % (ref 11.5–15.5)
WBC: 10 10*3/uL (ref 4.0–10.5)

## 2017-06-13 LAB — COMPREHENSIVE METABOLIC PANEL
ALT: 10 U/L — AB (ref 17–63)
AST: 16 U/L (ref 15–41)
Albumin: 4 g/dL (ref 3.5–5.0)
Alkaline Phosphatase: 67 U/L (ref 38–126)
Anion gap: 10 (ref 5–15)
BUN: 13 mg/dL (ref 6–20)
CALCIUM: 10.1 mg/dL (ref 8.9–10.3)
CO2: 26 mmol/L (ref 22–32)
CREATININE: 0.85 mg/dL (ref 0.61–1.24)
Chloride: 103 mmol/L (ref 101–111)
GFR calc non Af Amer: >60 mL/min (ref >60)
GLUCOSE: 105 mg/dL — AB (ref 65–99)
Potassium: 4.1 mmol/L (ref 3.5–5.1)
SODIUM: 139 mmol/L (ref 135–145)
Total Bilirubin: 0.6 mg/dL (ref 0.3–1.2)
Total Protein: 7.2 g/dL (ref 6.5–8.1)

## 2017-06-13 LAB — TROPONIN I

## 2017-06-13 LAB — BRAIN NATRIURETIC PEPTIDE: B Natriuretic Peptide: 57 pg/mL (ref 0.0–100.0)

## 2017-06-13 MED ORDER — ASPIRIN 325 MG PO TABS: 650.0000 mg | ORAL_TABLET | Freq: Every day | ORAL | Status: DC | PRN

## 2017-06-13 MED ORDER — IPRATROPIUM-ALBUTEROL 0.5-2.5 (3) MG/3ML IN SOLN: 3.0000 mL | RESPIRATORY_TRACT | Status: DC | PRN

## 2017-06-13 MED ORDER — NICOTINE 21 MG/24HR TD PT24
21.0000 mg | MEDICATED_PATCH | Freq: Once | TRANSDERMAL | Status: AC
  Administered 2017-06-13: 21 mg via TRANSDERMAL
  Filled 2017-06-13: qty 1

## 2017-06-13 MED ORDER — CALCIUM CARBONATE ANTACID 500 MG PO CHEW
1.0000 | CHEWABLE_TABLET | Freq: Every day | ORAL | Status: DC
  Administered 2017-06-13 – 2017-06-14 (×2): 200 mg via ORAL
  Filled 2017-06-13 (×2): qty 1

## 2017-06-13 MED ORDER — IPRATROPIUM-ALBUTEROL 0.5-2.5 (3) MG/3ML IN SOLN
3.0000 mL | Freq: Three times a day (TID) | RESPIRATORY_TRACT | Status: DC
  Administered 2017-06-14 – 2017-06-15 (×5): 3 mL via RESPIRATORY_TRACT
  Filled 2017-06-13 (×4): qty 3

## 2017-06-13 MED ORDER — METHYLPREDNISOLONE SODIUM SUCC 125 MG IJ SOLR
125.0000 mg | Freq: Once | INTRAMUSCULAR | Status: AC
  Administered 2017-06-13: 125 mg via INTRAVENOUS
  Filled 2017-06-13: qty 2

## 2017-06-13 MED ORDER — ENOXAPARIN SODIUM 40 MG/0.4ML ~~LOC~~ SOLN: 40.0000 mg | SUBCUTANEOUS | Status: DC

## 2017-06-13 MED ORDER — IPRATROPIUM-ALBUTEROL 0.5-2.5 (3) MG/3ML IN SOLN
3.0000 mL | Freq: Four times a day (QID) | RESPIRATORY_TRACT | Status: DC
  Administered 2017-06-13: 3 mL via RESPIRATORY_TRACT
  Filled 2017-06-13: qty 3

## 2017-06-13 MED ORDER — ONDANSETRON HCL 4 MG PO TABS: 4.0000 mg | ORAL_TABLET | Freq: Four times a day (QID) | ORAL | Status: DC | PRN

## 2017-06-13 MED ORDER — GUAIFENESIN ER 600 MG PO TB12
600.0000 mg | ORAL_TABLET | Freq: Two times a day (BID) | ORAL | Status: DC
  Administered 2017-06-13 – 2017-06-14 (×2): 600 mg via ORAL
  Filled 2017-06-13 (×2): qty 1

## 2017-06-13 MED ORDER — LOSARTAN POTASSIUM 50 MG PO TABS
100.0000 mg | ORAL_TABLET | Freq: Every day | ORAL | Status: DC
  Administered 2017-06-14 – 2017-06-15 (×2): 100 mg via ORAL
  Filled 2017-06-13 (×3): qty 2

## 2017-06-13 MED ORDER — ALBUTEROL (5 MG/ML) CONTINUOUS INHALATION SOLN
10.0000 mg/h | INHALATION_SOLUTION | RESPIRATORY_TRACT | Status: DC
  Administered 2017-06-13: 10 mg/h via RESPIRATORY_TRACT
  Filled 2017-06-13: qty 20

## 2017-06-13 MED ORDER — METHYLPREDNISOLONE SODIUM SUCC 125 MG IJ SOLR
60.0000 mg | Freq: Four times a day (QID) | INTRAMUSCULAR | Status: DC
  Administered 2017-06-13 – 2017-06-15 (×8): 60 mg via INTRAVENOUS
  Filled 2017-06-13 (×8): qty 2

## 2017-06-13 MED ORDER — AMLODIPINE BESYLATE 5 MG PO TABS
10.0000 mg | ORAL_TABLET | Freq: Every day | ORAL | Status: DC
  Administered 2017-06-14 – 2017-06-15 (×2): 10 mg via ORAL
  Filled 2017-06-13 (×2): qty 2

## 2017-06-13 MED ORDER — ENOXAPARIN SODIUM 40 MG/0.4ML ~~LOC~~ SOLN
40.0000 mg | SUBCUTANEOUS | Status: DC
  Administered 2017-06-13 – 2017-06-14 (×2): 40 mg via SUBCUTANEOUS
  Filled 2017-06-13 (×2): qty 0.4

## 2017-06-13 MED ORDER — ALBUTEROL SULFATE (2.5 MG/3ML) 0.083% IN NEBU
5.0000 mg | INHALATION_SOLUTION | Freq: Once | RESPIRATORY_TRACT | Status: AC
  Administered 2017-06-13: 5 mg via RESPIRATORY_TRACT
  Filled 2017-06-13: qty 6

## 2017-06-13 MED ORDER — ONDANSETRON HCL 4 MG/2ML IJ SOLN: 4.0000 mg | Freq: Four times a day (QID) | INTRAMUSCULAR | Status: DC | PRN

## 2017-06-13 MED ORDER — IPRATROPIUM-ALBUTEROL 0.5-2.5 (3) MG/3ML IN SOLN: 3.0000 mL | Freq: Three times a day (TID) | RESPIRATORY_TRACT | Status: DC

## 2017-06-13 NOTE — ED Triage Notes (Signed)
COPD pt, worsen SOB last night, cough - nonproductive, congested,  Pt was 85% on RA in triage, placed on 2L via , now 93%

## 2017-06-13 NOTE — ED Notes (Signed)
Waiting for pt. To finish breathing treatment then reassess

## 2017-06-13 NOTE — ED Notes (Signed)
'  s Pt took [place] off to eat and sats dropped to 85-88%. Have put pt.'s O2 back on and sats are now rising to

## 2017-06-13 NOTE — ED Provider Notes (Signed)
[facility] DEPT Provider Note   CSN: [id number] Arrival date & time: 06/13/17  1021     History   Chief Complaint Chief Complaint  Patient presents with  . Shortness of Breath    HPI Martin Santiago is a 66 y.o. male.  HPI Patient presents with increasing shortness of breath over the last few days. Has had a nonproductive cough. Denies any fever or chills. States he's having difficulty lying flat. He denies having any chest pain or back pain. No new lower extremity swelling. Has history of COPD but has not used any of his inhalers at home. Past Medical History:  Diagnosis Date  . Colon polyps   . COPD (chronic obstructive pulmonary disease) ([place])   . Crohn's disease ([place])   . Detached retina   . Essential hypertension   . Tropical sprue     Patient Active Problem List   Diagnosis Date Noted  . COPD (chronic obstructive pulmonary disease) ([place]) 06/13/2017  . Hx of colonic polyps 11/19/2012    Past Surgical History:  Procedure Laterality Date  . BOWEL RESECTION  05/1994   [facility]:  ? Crohn's  . COLONOSCOPY     Dr Derrill [facility], [facility]:  Hx polyps. Cannot remmeber date  . COLONOSCOPY N/A 12/03/2012   Procedure: COLONOSCOPY;  Surgeon: Daneil Dolin, MD;  Location: [facility];  Service: Endoscopy;  Laterality: N/A;  7:30  . INGUINAL HERNIA REPAIR Right   . RETINAL DETACHMENT SURGERY  07/2006  . RETINAL DETACHMENT SURGERY  09/2006  . RETINAL DETACHMENT SURGERY  01/2007       Home Medications    Prior to Admission medications   Medication Sig Start Date End Date Taking? Authorizing Provider  amLODipine (NORVASC) 10 MG tablet Take 10 mg by mouth daily. Reported on 02/25/2016 11/16/12  Yes [provider]  aspirin buffered (BUFFERIN) 325 MG TABS tablet Take 650 mg by mouth daily as needed (pain).   Yes [provider]  calcium carbonate (TUMS - DOSED IN MG ELEMENTAL CALCIUM) 500 MG chewable tablet Chew 1 tablet by mouth at bedtime.   Yes [provider]  losartan (COZAAR) 100 MG tablet Take 100 mg by mouth daily. Reported on 02/25/2016 11/16/12   [provider]    Family History Family History  Problem Relation Age of Onset  . Colon cancer Maternal Aunt   . Breast cancer Mother     Social History Social History  Substance Use Topics  . Smoking status: Current Every Day Smoker    Packs/day: 1.50    Years: 45.00    Types: Cigarettes  . Smokeless tobacco: Never Used  . Alcohol use 0.0 oz/week     Comment: Rare, 3-4 per yr     Allergies   Codeine   Review of Systems Review of Systems  Constitutional: Negative for chills and fever.  Respiratory: Positive for cough, shortness of breath and wheezing.   Cardiovascular: Negative for chest pain, palpitations and leg swelling.  Gastrointestinal: Negative for abdominal pain, constipation, diarrhea, nausea and vomiting.  Genitourinary: Negative for dysuria, flank pain and hematuria.  Musculoskeletal: Negative for back pain, myalgias, neck pain and neck stiffness.  Skin: Negative for rash and wound.  Neurological: Negative for dizziness, weakness, light-headedness, numbness and headaches.  All other systems reviewed and are negative.    Physical Exam Updated Vital Signs BP (!) 122/92 (BP Location: Right Arm)   Pulse 91   Temp 98.4 F (36.9 C) (Oral)   Resp Marland Kitchen)  29   Ht 5\' 6"  (1.676 m)   Wt 61.2 kg (135 lb)   SpO2 97%   BMI 21.79 kg/m   Physical Exam  Constitutional: He is oriented to person, place, and time. He appears well-developed and well-nourished.  HENT:  Head: Normocephalic and atraumatic.  Mouth/Throat: Oropharynx is clear and moist. No oropharyngeal exudate.  Eyes: Pupils are equal, round, and reactive to light. EOM are normal.  Neck: Normal range of motion. Neck supple.  Cardiovascular: Normal rate and regular rhythm.  Exam reveals no gallop and no friction rub.   No murmur heard. Pulmonary/Chest: Effort normal. He has wheezes. He has  rales.  Leaning forward with increased work of breathing. Scattered expiratory wheezing and crackles in bilateral bases.  Abdominal: Soft. Bowel sounds are normal. There is no tenderness. There is no rebound and no guarding.  Musculoskeletal: Normal range of motion. He exhibits no edema or tenderness.  No lower extremity swelling, asymmetry or tenderness. No midline thoracic or lumbar tenderness. No CVA tenderness bilaterally. Distal pulses are 2+  Neurological: He is alert and oriented to person, place, and time.  Moving all extremities without focal deficit. Sensation intact.  Skin: Skin is warm and dry. Capillary refill takes less than 2 seconds. No rash noted. No erythema.  Psychiatric: He has a normal mood and affect. His behavior is normal.  Nursing note and vitals reviewed.    ED Treatments / Results  Labs (all labs ordered are listed, but only abnormal results are displayed) Labs Reviewed  COMPREHENSIVE METABOLIC PANEL - Abnormal; Notable for the following:       Result Value   Glucose, Bld 105 (*)    ALT 10 (*)    All other components within normal limits  CBC WITH DIFFERENTIAL/PLATELET  BRAIN NATRIURETIC PEPTIDE  TROPONIN I    EKG  EKG Interpretation None       Radiology Dg Chest 2 View  Result Date: 06/13/2017 CLINICAL DATA:  Shortness of breath EXAM: CHEST  2 VIEW COMPARISON:  12/07/2015 FINDINGS: The lungs are hyperinflated likely secondary to COPD. There is no focal parenchymal opacity. There is no pleural effusion or pneumothorax. The heart and mediastinal contours are unremarkable. The osseous structures are unremarkable. IMPRESSION: No active cardiopulmonary disease. Electronically Signed   By: Kathreen Devoid   On: 06/13/2017 12:37    Procedures Procedures (including critical care time)  Medications Ordered in ED Medications  albuterol (PROVENTIL,VENTOLIN) solution continuous neb (not administered)  nicotine (NICODERM CQ - dosed in mg/24 hours) patch 21 mg  (not administered)  methylPREDNISolone sodium succinate (SOLU-MEDROL) 125 mg/2 mL injection 125 mg (125 mg Intravenous Given 06/13/17 1159)  albuterol (PROVENTIL) (2.5 MG/3ML) 0.083% nebulizer solution 5 mg (5 mg Nebulization Given 06/13/17 1149)     Initial Impression / Assessment and Plan / ED Course  I have reviewed the triage vital signs and the nursing notes.  Pertinent labs & imaging results that were available during my care of the patient were reviewed by me and considered in my medical decision making (see chart for details).     Patient still requiring supplemental oxygen despite solumedrol and nebulized treatments. Discussed with hospitalist will see patient in emergency department and admit.  Final Clinical Impressions(s) / ED Diagnoses   Final diagnoses:  COPD exacerbation [facility])    New Prescriptions New Prescriptions   No medications on file     Julianne Rice, MD 06/13/17 1408

## 2017-06-13 NOTE — ED Notes (Signed)
Pt has decided to stay. Will call and give report to [phone]

## 2017-06-13 NOTE — H&P (Signed)
History and Physical    Martin Santiago WJX:[id number] DOB: 1951/07/20 DOA: 06/13/2017  Referring MD/NP/PA: Dr. Lita Mains  PCP: Celene Squibb, MD   Patient coming from: home   Chief Complaint: dyspnea   HPI: Martin Santiago is a 66 y.o. male with known COPD and ongoing tobacco use, presented to [facility] ED with main concern of several days duration of progressively worsening dyspnea that started with exertion and has progressed to dyspnea at rest. This was associated with wheezing and fatigue, poor oral intake. Patient reports cough but he explains that is chronic and mostly non productive, worse in am. Patient denies any chest pain, no fevers, chills, no abd or urinary concerns.   ED Course: Pt was hypoxic and has required oxygen via [place]. Unable to taper off oxygen and requiring admission for further evaluation. Patient also noted to be wheezing and was started on Solumedrol 125 mg IV x 1 dose. TRH asked to admit for further evaluation.   Review of Systems:  Constitutional: Negative for fever, chills, diaphoresis, activity change HENT: Negative for ear pain, nosebleeds, congestion, facial swelling, rhinorrhea, neck pain, neck stiffness and ear discharge.   Eyes: Negative for pain, discharge, redness, itching and visual disturbance.  Respiratory: Per HPI  Cardiovascular: Negative for chest pain, palpitations and leg swelling.  Gastrointestinal: Negative for abdominal distention.  Genitourinary: Negative for dysuria, urgency, frequency, hematuria, flank pain, decreased urine volume, difficulty urinating and dyspareunia.  Musculoskeletal: Negative for back pain, joint swelling, arthralgias and gait problem.  Neurological: Negative for dizziness, tremors, seizures, syncope, facial asymmetry, speech difficulty, weakness, light-headedness, numbness and headaches.  Hematological: Negative for adenopathy. Does not bruise/bleed easily.  Psychiatric/Behavioral: Negative for hallucinations, behavioral  problems, confusion, dysphoric mood, decreased concentration and agitation.   Past Medical History:  Diagnosis Date  . Colon polyps   . COPD (chronic obstructive pulmonary disease) ([place])   . Crohn's disease ([place])   . Detached retina   . Essential hypertension   . Tropical sprue     Past Surgical History:  Procedure Laterality Date  . BOWEL RESECTION  05/1994   [facility]:  ? Crohn's  . COLONOSCOPY     Dr Derrill [facility], [facility]:  Hx polyps. Cannot remmeber date  . COLONOSCOPY N/A 12/03/2012   Procedure: COLONOSCOPY;  Surgeon: Daneil Dolin, MD;  Location[facility];  Service: Endoscopy;  Laterality: N/A;  7:30  . INGUINAL HERNIA REPAIR Right   . RETINAL DETACHMENT SURGERY  07/2006  . RETINAL DETACHMENT SURGERY  09/2006  . RETINAL DETACHMENT SURGERY  01/2007   Social Hx:  reports that he has been smoking Cigarettes.  He has a 67.50 pack-year smoking history. He has never used smokeless tobacco. He reports that he drinks alcohol. He reports that he does not use drugs.  Allergies  Allergen Reactions  . Codeine Nausea And Vomiting and Other (See Comments)    Other reaction(s): Flushing (ALLERGY/intolerance) fainting    Family History  Problem Relation Age of Onset  . Colon cancer Maternal Aunt   . Breast cancer Mother     Medication Sig  amLODipine (NORVASC) 10 MG tablet Take 10 mg by mouth daily. Reported on 02/25/2016  aspirin buffered (BUFFERIN) 325 MG TABS tablet Take 650 mg by mouth daily as needed (pain).  losartan (COZAAR) 100 MG tablet Take 100 mg by mouth daily. Reported on 02/25/2016    Physical Exam: Vitals:   06/13/17 1330 06/13/17 1400 06/13/17 1430 06/13/17 1434  BP: 128/90 124/69 130/80  Pulse: 95 91 92   Resp: 19 (!) 25 (!) 21   Temp:      TempSrc:      SpO2: 92% (!) 88% (!) 88% (!) 88%  Weight:      Height:        Constitutional: NAD, calm, comfortable Vitals:   06/13/17 1330 06/13/17 1400 06/13/17 1430 06/13/17 1434  BP: 128/90 124/69 130/80   Pulse:  95 91 92   Resp: 19 (!) 25 (!) 21   Temp:      TempSrc:      SpO2: 92% (!) 88% (!) 88% (!) 88%  Weight:      Height:       Eyes: PERRL, lids and conjunctivae normal ENMT: Mucous membranes are moist. Posterior pharynx clear of any exudate or lesions.Normal dentition.  Neck: normal, supple, no masses, no thyromegaly Respiratory: course breath sounds bilaterally with exp wheezing  Cardiovascular: Regular rate and rhythm, no murmurs / rubs / gallops. No extremity edema. 2+ pedal pulses. No carotid bruits.  Abdomen: no tenderness, no masses palpated. No hepatosplenomegaly. Bowel sounds positive.  Musculoskeletal: no clubbing / cyanosis. No joint deformity upper and lower extremities. Good ROM, no contractures. Normal muscle tone.  Skin: no rashes, lesions, ulcers. No induration Neurologic: CN 2-12 grossly intact. Sensation intact, DTR normal. Strength 5/5 in all 4.  Psychiatric: Normal judgment and insight. Alert and oriented x 3. Normal mood.   Labs on Admission: I have personally reviewed following labs and imaging studies  CBC:  Recent Labs Lab 06/13/17 1159  WBC 10.0  NEUTROABS 7.7  HGB 16.2  HCT 48.0  MCV 93.2  PLT 440   Basic Metabolic Panel:  Recent Labs Lab 06/13/17 1159  NA 139  K 4.1  CL 103  CO2 26  GLUCOSE 105*  BUN 13  CREATININE 0.85  CALCIUM 10.1   Liver Function Tests:  Recent Labs Lab 06/13/17 1159  AST 16  ALT 10*  ALKPHOS 67  BILITOT 0.6  PROT 7.2  ALBUMIN 4.0   Cardiac Enzymes:  Recent Labs Lab 06/13/17 1159  TROPONINI <0.03   Radiological Exams on Admission: Dg Chest 2 View  Result Date: 06/13/2017 CLINICAL DATA:  Shortness of breath EXAM: CHEST  2 VIEW COMPARISON:  12/07/2015 FINDINGS: The lungs are hyperinflated likely secondary to COPD. There is no focal parenchymal opacity. There is no pleural effusion or pneumothorax. The heart and mediastinal contours are unremarkable. The osseous structures are unremarkable. IMPRESSION: No  active cardiopulmonary disease. Electronically Signed   By: Kathreen Devoid   On: 06/13/2017 12:37   EKG: pending   Assessment/Plan Active Problems:   COPD (chronic obstructive pulmonary disease) ([place]) - admit for further management - place on solumedrol IVF 60 mg Q6 hours and taper down as clinically indicated - place on bronchodilators scheduled and as needed - provide oxygen via [place]    HTN - continue home medical regimen    DVT prophylaxis: Lovenox SQ Code Status: Full  Family Communication: Pt updated at bedside Disposition Plan: admit to tele unit  Consults called: None Admission status: Inpatient   Faye Ramsay MD [facility] Pager [phone]  If 7PM-7AM, please contact night-coverage [email or web address] Password [facility]  06/13/2017, 3:40 PM

## 2017-06-13 NOTE — ED Notes (Signed)
EKG done in triage

## 2017-06-14 DIAGNOSIS — Z720 Tobacco use: Secondary | ICD-10-CM

## 2017-06-14 DIAGNOSIS — J9601 Acute respiratory failure with hypoxia: Secondary | ICD-10-CM | POA: Diagnosis present

## 2017-06-14 DIAGNOSIS — I10 Essential (primary) hypertension: Secondary | ICD-10-CM

## 2017-06-14 DIAGNOSIS — J441 Chronic obstructive pulmonary disease with (acute) exacerbation: Principal | ICD-10-CM

## 2017-06-14 LAB — BASIC METABOLIC PANEL
ANION GAP: 8 (ref 5–15)
BUN: 28 mg/dL — AB (ref 6–20)
CALCIUM: 10.3 mg/dL (ref 8.9–10.3)
CO2: 29 mmol/L (ref 22–32)
CREATININE: 0.99 mg/dL (ref 0.61–1.24)
Chloride: 101 mmol/L (ref 101–111)
GFR calc Af Amer: >60 mL/min (ref >60)
GLUCOSE: 158 mg/dL — AB (ref 65–99)
Potassium: 4 mmol/L (ref 3.5–5.1)
Sodium: 138 mmol/L (ref 135–145)

## 2017-06-14 LAB — CBC
HCT: 45.4 % (ref 39.0–52.0)
Hemoglobin: 15.2 g/dL (ref 13.0–17.0)
MCH: 31.1 pg (ref 26.0–34.0)
MCHC: 33.5 g/dL (ref 30.0–36.0)
MCV: 93 fL (ref 78.0–100.0)
Platelets: 222 10*3/uL (ref 150–400)
RBC: 4.88 MIL/uL (ref 4.22–5.81)
RDW: 14.4 % (ref 11.5–15.5)
WBC: 17.3 10*3/uL — ABNORMAL HIGH (ref 4.0–10.5)

## 2017-06-14 MED ORDER — GUAIFENESIN ER 600 MG PO TB12
1200.0000 mg | ORAL_TABLET | Freq: Two times a day (BID) | ORAL | Status: DC
  Administered 2017-06-14 – 2017-06-15 (×2): 1200 mg via ORAL
  Filled 2017-06-14 (×2): qty 2

## 2017-06-14 MED ORDER — PNEUMOCOCCAL VAC POLYVALENT 25 MCG/0.5ML IJ INJ
0.5000 mL | INJECTION | INTRAMUSCULAR | Status: AC
  Administered 2017-06-15: 0.5 mL via INTRAMUSCULAR
  Filled 2017-06-14: qty 0.5

## 2017-06-14 MED ORDER — NICOTINE 21 MG/24HR TD PT24
21.0000 mg | MEDICATED_PATCH | Freq: Every day | TRANSDERMAL | Status: DC
  Administered 2017-06-14 – 2017-06-15 (×2): 21 mg via TRANSDERMAL
  Filled 2017-06-14 (×2): qty 1

## 2017-06-14 MED ORDER — AZITHROMYCIN 250 MG PO TABS
500.0000 mg | ORAL_TABLET | Freq: Every day | ORAL | Status: AC
  Administered 2017-06-14: 500 mg via ORAL
  Filled 2017-06-14: qty 2

## 2017-06-14 MED ORDER — INFLUENZA VAC SPLIT HIGH-DOSE 0.5 ML IM SUSY
0.5000 mL | PREFILLED_SYRINGE | INTRAMUSCULAR | Status: AC
  Administered 2017-06-15: 0.5 mL via INTRAMUSCULAR
  Filled 2017-06-14: qty 0.5

## 2017-06-14 MED ORDER — ALUM & MAG HYDROXIDE-SIMETH 200-200-20 MG/5ML PO SUSP
30.0000 mL | Freq: Four times a day (QID) | ORAL | Status: DC | PRN
  Administered 2017-06-14 (×2): 30 mL via ORAL
  Filled 2017-06-14 (×2): qty 30

## 2017-06-14 MED ORDER — CHLORHEXIDINE GLUCONATE 0.12 % MT SOLN
15.0000 mL | Freq: Two times a day (BID) | OROMUCOSAL | Status: DC
  Administered 2017-06-14 – 2017-06-15 (×3): 15 mL via OROMUCOSAL
  Filled 2017-06-14 (×3): qty 15

## 2017-06-14 MED ORDER — ORAL CARE MOUTH RINSE
15.0000 mL | Freq: Two times a day (BID) | OROMUCOSAL | Status: DC
Start: 2017-06-14 — End: 2017-06-15

## 2017-06-14 MED ORDER — AZITHROMYCIN 250 MG PO TABS
250.0000 mg | ORAL_TABLET | Freq: Every day | ORAL | Status: DC
  Administered 2017-06-15: 250 mg via ORAL
  Filled 2017-06-14: qty 1

## 2017-06-14 MED ORDER — BUDESONIDE 0.25 MG/2ML IN SUSP
0.2500 mg | Freq: Two times a day (BID) | RESPIRATORY_TRACT | Status: DC
  Administered 2017-06-14 – 2017-06-15 (×2): 0.25 mg via RESPIRATORY_TRACT
  Filled 2017-06-14 (×2): qty 2

## 2017-06-14 NOTE — Progress Notes (Signed)
SATURATION QUALIFICATIONS: (This note is used to comply with regulatory documentation for home oxygen)  Patient Saturations on Room Air at Rest = 90%  Patient Saturations on Room Air while Ambulating = 84%  Patient Saturations on 4 Liters of oxygen while Ambulating = 89%

## 2017-06-14 NOTE — Progress Notes (Signed)
PROGRESS NOTE    Martin Santiago  VEH:[id number] DOB: 1951/03/28 DOA: 06/13/2017 PCP: Celene Squibb, MD    Brief Narrative:  66 year old male with known COPD and tobacco use, presented with worsening shortness of breath due to COPD exacerbation. Currently on intravenous steroids, antibiotics and bronchodilators. Anticipate discharge home the next 1-2 days.   Assessment & Plan:   Active Problems:   COPD (chronic obstructive pulmonary disease) (H[facility])   Acute respiratory failure with hypoxia ([facility])   Tobacco abuse   HTN (hypertension)   1. COPD exacerbation. Feeling better since admission and continue on IV steroids, antibiotics, bronchodilators. 2. Acute respiratory failure with hypoxia. COPD exacerbation. Reports that respiratory status is not quite back to baseline yet. Continue to wean off oxygen as tolerated. 3. Hypertension. Continue on amlodipine and losartan 4. Tobacco use. Nicotine patch   DVT prophylaxis: lovenox Code Status: full Family Communication: discussed with patient Disposition Plan: discharge home once improved   Consultants:     Procedures:     Antimicrobials:   Azithromycin 9/19>    Subjective: Overall feeling better. Shortness of breath is not back to baseline yet.  Objective: Vitals:   06/14/17 0645 06/14/17 0935 06/14/17 1426 06/14/17 1440  BP: 118/71  128/73   Pulse: 87  90   Resp:      Temp: 98 F (36.7 C)  98.1 F (36.7 C)   TempSrc: Oral  Oral   SpO2: 91% 91% 91% (!) 86%  Weight: 60.6 kg (133 lb 8 oz)     Height:        Intake/Output Summary (Last 24 hours) at 06/14/17 1453 Last data filed at 06/13/17 1900  Gross per 24 hour  Intake              240 ml  Output                0 ml  Net              240 ml   Filed Weights   06/13/17 1038 06/13/17 1729 06/14/17 0645  Weight: 61.2 kg (135 lb) 61.2 kg (135 lb) 60.6 kg (133 lb 8 oz)    Examination:  General exam: Appears calm and comfortable  Respiratory system: diminished  breath sounds with mild wheeze bilaterally Cardiovascular system: S1 & S2 heard, RRR. No JVD, murmurs, rubs, gallops or clicks. No pedal edema. Gastrointestinal system: Abdomen is nondistended, soft and nontender. No organomegaly or masses felt. Normal bowel sounds heard. Central nervous system: Alert and oriented. No focal neurological deficits. Extremities: Symmetric 5 x 5 power. Skin: No rashes, lesions or ulcers Psychiatry: Judgement and insight appear normal. Mood & affect appropriate.     Data Reviewed: I have personally reviewed following labs and imaging studies  CBC:  Recent Labs Lab 06/13/17 1159 06/14/17 0705  WBC 10.0 17.3*  NEUTROABS 7.7  --   HGB 16.2 15.2  HCT 48.0 45.4  MCV 93.2 93.0  PLT 205 836   Basic Metabolic Panel:  Recent Labs Lab 06/13/17 1159 06/14/17 0705  NA 139 138  K 4.1 4.0  CL 103 101  CO2 26 29  GLUCOSE 105* 158*  BUN 13 28*  CREATININE 0.85 0.99  CALCIUM 10.1 10.3   GFR: Estimated Creatinine Clearance: 62.1 mL/min (by C-G formula based on SCr of 0.99 mg/dL). Liver Function Tests:  Recent Labs Lab 06/13/17 1159  AST 16  ALT 10*  ALKPHOS 67  BILITOT 0.6  PROT 7.2  ALBUMIN 4.0  No results for input(s): LIPASE, AMYLASE in the last 168 hours. No results for input(s): AMMONIA in the last 168 hours. Coagulation Profile: No results for input(s): INR, PROTIME in the last 168 hours. Cardiac Enzymes:  Recent Labs Lab 06/13/17 1159  TROPONINI <0.03   BNP (last 3 results) No results for input(s): PROBNP in the last 8760 hours. HbA1C: No results for input(s): HGBA1C in the last 72 hours. CBG: No results for input(s): GLUCAP in the last 168 hours. Lipid Profile: No results for input(s): CHOL, HDL, LDLCALC, TRIG, CHOLHDL, LDLDIRECT in the last 72 hours. Thyroid Function Tests: No results for input(s): TSH, T4TOTAL, FREET4, T3FREE, THYROIDAB in the last 72 hours. Anemia Panel: No results for input(s): VITAMINB12, FOLATE,  FERRITIN, TIBC, IRON, RETICCTPCT in the last 72 hours. Sepsis Labs: No results for input(s): PROCALCITON, LATICACIDVEN in the last 168 hours.  No results found for this or any previous visit (from the past 240 hour(s)).       Radiology Studies: Dg Chest 2 View  Result Date: 06/13/2017 CLINICAL DATA:  Shortness of breath EXAM: CHEST  2 VIEW COMPARISON:  12/07/2015 FINDINGS: The lungs are hyperinflated likely secondary to COPD. There is no focal parenchymal opacity. There is no pleural effusion or pneumothorax. The heart and mediastinal contours are unremarkable. The osseous structures are unremarkable. IMPRESSION: No active cardiopulmonary disease. Electronically Signed   By: Kathreen Devoid   On: 06/13/2017 12:37        Scheduled Meds: . amLODipine  10 mg Oral Daily  . [START ON 06/15/2017] azithromycin  250 mg Oral Daily  . budesonide (PULMICORT) nebulizer solution  0.25 mg Nebulization BID  . calcium carbonate  1 tablet Oral QHS  . chlorhexidine  15 mL Mouth Rinse BID  . enoxaparin (LOVENOX) injection  40 mg Subcutaneous Q24H  . guaiFENesin  1,200 mg Oral BID  . [START ON 06/15/2017] Influenza vac split quadrivalent PF  0.5 mL Intramuscular Tomorrow-1000  . ipratropium-albuterol  3 mL Nebulization TID  . losartan  100 mg Oral Daily  . mouth rinse  15 mL Mouth Rinse q12n4p  . methylPREDNISolone (SOLU-MEDROL) injection  60 mg Intravenous Q6H  . nicotine  21 mg Transdermal Daily  . [START ON 06/15/2017] pneumococcal 23 valent vaccine  0.5 mL Intramuscular Tomorrow-1000   Continuous Infusions:   LOS: 1 day    Time spent: 91mins    Naseem Varden, MD [facility] Pager [phone]  If 7PM-7AM, please contact night-coverage [email or web address] Password [email or web address] 06/14/2017, 2:53 PM

## 2017-06-14 NOTE — Care Management Note (Signed)
Case Management Note  Patient Details  Name: Martin Santiago MRN: [id number] Date of Birth: [birth year]  Subjective/Objective:     Adm with COPD. From home, ind with ADL's. No HH or DME PTA. He is acutely on 2L. He smokes, wants to leave now and go outside to smoke.              Action/Plan: Anticipate DC home with self care. Will need oxygen home O2 assessment. CM following.   Expected Discharge Date:       06/14/2017           Expected Discharge Plan:  Home/Self Care  In-House Referral:     Discharge planning Services  CM Consult  Post Acute Care Choice:    Choice offered to:  Patient  DME Arranged:    DME Agency:     HH Arranged:    [facility] Agency:     Status of Service:  In process, will continue to follow  If discussed at Long Length of Stay Meetings, dates discussed:    Additional Comments:  Ismar Yabut, Chauncey [place], RN 06/14/2017, 1:37 PM

## 2017-06-15 MED ORDER — TIOTROPIUM BROMIDE MONOHYDRATE 18 MCG IN CAPS: 18.0000 ug | ORAL_CAPSULE | Freq: Every day | RESPIRATORY_TRACT | 2 refills | Status: DC

## 2017-06-15 MED ORDER — PREDNISONE 10 MG PO TABS: ORAL_TABLET | ORAL | 0 refills | Status: DC

## 2017-06-15 MED ORDER — AZITHROMYCIN 250 MG PO TABS: 250.0000 mg | ORAL_TABLET | Freq: Every day | ORAL | 0 refills | Status: DC

## 2017-06-15 MED ORDER — GUAIFENESIN ER 600 MG PO TB12: 600.0000 mg | ORAL_TABLET | Freq: Two times a day (BID) | ORAL | 0 refills | Status: DC

## 2017-06-15 MED ORDER — ALBUTEROL SULFATE (2.5 MG/3ML) 0.083% IN NEBU: 2.5000 mg | INHALATION_SOLUTION | Freq: Four times a day (QID) | RESPIRATORY_TRACT | 12 refills | Status: DC | PRN

## 2017-06-15 MED ORDER — BUDESONIDE-FORMOTEROL FUMARATE 160-4.5 MCG/ACT IN AERO: 2.0000 | INHALATION_SPRAY | Freq: Two times a day (BID) | RESPIRATORY_TRACT | 12 refills | Status: DC

## 2017-06-15 MED ORDER — NICOTINE 21 MG/24HR TD PT24: 21.0000 mg | MEDICATED_PATCH | Freq: Every day | TRANSDERMAL | 0 refills | Status: DC

## 2017-06-15 MED ORDER — BLOOD GLUCOSE MONITOR KIT: PACK | 0 refills | Status: DC

## 2017-06-15 NOTE — Care Management (Addendum)
    Durable Medical Equipment        Start     Ordered   06/15/17 1516  For home use only DME oxygen  Once    Question Answer Comment  Mode or (Route) Nasal cannula   Liters per Minute 3   Frequency Continuous (stationary and portable oxygen unit needed)   Oxygen conserving device Yes   Oxygen delivery system Gas      06/15/17 1516   06/15/17 1516  For home use only DME Nebulizer machine  Once    Question:  Patient needs a nebulizer to treat with the following condition  Answer:  COPD (chronic obstructive pulmonary disease) ([place])   06/15/17 1516    Patient qualifies for home oxygen. IV steroids, antibiotics, and neb treatments have been tried and failed. Offered choice of DME agency. Patient has no preference. Vaughan Basta of [facility] notified of orders and will obtain from chart. Port tank will be delivered to room prior to discharge along with a nebulizer machine. Patient is eligible for [facility] services, will order [facility] discharge EMMI calls.

## 2017-06-15 NOTE — Progress Notes (Signed)
SATURATION QUALIFICATIONS: (This note is used to comply with regulatory documentation for home oxygen)  Patient Saturations on Room Air at Rest = 90%  Patient Saturations on Room Air while Ambulating = 84%  Patient Saturations on 3 Liters of oxygen while Ambulating = 89%  Please briefly explain why patient needs home oxygen: 

## 2017-06-15 NOTE — Discharge Summary (Signed)
Physician Discharge Summary  Martin Santiago KMM:[id number] DOB: 1951-03-01 DOA: 06/13/2017  PCP: Celene Squibb, MD  Admit date: 06/13/2017 Discharge date: 06/15/2017  Admitted From: Home Disposition:  Home  Recommendations for Outpatient Follow-up:  1. Follow up with PCP in 1-2 weeks 2. Please obtain BMP/CBC in one week  Home Health: Equipment/Devices:Oxygen 3 L, nebulizer machine  Discharge Condition:Stable CODE STATUS:Full code Diet recommendation: Heart Healthy   Brief/Interim Summary: 66 year old male who appears to have fairly advanced COPD and ongoing tobacco use, was admitted with worsening shortness of breath due to COPD exacerbation. Patient was treated with intravenous steroids, antibiotics and bronchodilators. He reports improvement of his respiratory status. Feels that his breathing is close to approaching baseline. The patient becomes hypoxic on room air and will need continued supplemental oxygen. He is breathing comfortably at this time. He is requesting discharge home. He'll be continued on a prednisone taper, antibiotics will be set up with a nebulizer machine at home. I strongly advised him to quit smoking, that the patient appears quite determined to continue smoking. We'll send him home with a nicotine patch and advised to follow-up with his primary care physician for further management.  Discharge Diagnoses:  Active Problems:   COPD (chronic obstructive pulmonary disease) (HCC)   Acute respiratory failure with hypoxia (HCC)   Tobacco abuse   HTN (hypertension)    Discharge Instructions  Discharge Instructions    Diet - low sodium heart healthy    Complete by:  As directed    Increase activity slowly    Complete by:  As directed      Allergies as of 06/15/2017      Reactions   Codeine Nausea And Vomiting, Other (See Comments)   Other reaction(s): Flushing (ALLERGY/intolerance) fainting      Medication List    TAKE these medications   albuterol (2.5  MG/3ML) 0.083% nebulizer solution Commonly known as:  PROVENTIL Take 3 mLs (2.5 mg total) by nebulization every 6 (six) hours as needed for wheezing or shortness of breath.   amLODipine 10 MG tablet Commonly known as:  NORVASC Take 10 mg by mouth daily. Reported on 02/25/2016   azithromycin 250 MG tablet Commonly known as:  ZITHROMAX Take 1 tablet (250 mg total) by mouth daily.   blood glucose meter kit and supplies Kit Dispense based on patient and insurance preference. Use up to four times daily as directed. (FOR ICD-9 250.00, 250.01).   budesonide-formoterol 160-4.5 MCG/ACT inhaler Commonly known as:  SYMBICORT Inhale 2 puffs into the lungs 2 (two) times daily.   BUFFERIN 325 MG Tabs tablet Generic drug:  aspirin buffered Take 650 mg by mouth daily as needed (pain).   calcium carbonate 500 MG chewable tablet Commonly known as:  TUMS - dosed in mg elemental calcium Chew 1 tablet by mouth at bedtime.   guaiFENesin 600 MG 12 hr tablet Commonly known as:  MUCINEX Take 1 tablet (600 mg total) by mouth 2 (two) times daily.   losartan 100 MG tablet Commonly known as:  COZAAR Take 100 mg by mouth daily. Reported on 02/25/2016   nicotine 21 mg/24hr patch Commonly known as:  NICODERM CQ - dosed in mg/24 hours Place 1 patch (21 mg total) onto the skin daily.   predniSONE 10 MG tablet Commonly known as:  DELTASONE Take 88m po daily for 2 days then 360mdaily for 2 days then 2047maily for 2 days then 47m76mily for 2 days then stop   tiotropium 18  MCG inhalation capsule Commonly known as:  SPIRIVA HANDIHALER Place 1 capsule (18 mcg total) into inhaler and inhale daily.            Durable Medical Equipment        Start     Ordered   06/15/17 1516  For home use only DME oxygen  Once    Question Answer Comment  Mode or (Route) Nasal cannula   Liters per Minute 3   Frequency Continuous (stationary and portable oxygen unit needed)   Oxygen conserving device Yes   Oxygen  delivery system Gas      06/15/17 1516   06/15/17 1516  For home use only DME Nebulizer machine  Once    Question:  Patient needs a nebulizer to treat with the following condition  Answer:  COPD (chronic obstructive pulmonary disease) ([place])   06/15/17 1516       Discharge Care Instructions        Start     Ordered   06/16/17 0000  nicotine (NICODERM CQ - DOSED IN MG/24 HOURS) 21 mg/24hr patch  Daily     06/15/17 1700   06/15/17 0000  azithromycin (ZITHROMAX) 250 MG tablet  Daily     06/15/17 1700   06/15/17 0000  guaiFENesin (MUCINEX) 600 MG 12 hr tablet  2 times daily     06/15/17 1700   06/15/17 0000  predniSONE (DELTASONE) 10 MG tablet     06/15/17 1700   06/15/17 0000  albuterol (PROVENTIL) (2.5 MG/3ML) 0.083% nebulizer solution  Every 6 hours PRN     06/15/17 1700   06/15/17 0000  tiotropium (SPIRIVA HANDIHALER) 18 MCG inhalation capsule  Daily     06/15/17 1700   06/15/17 0000  budesonide-formoterol (SYMBICORT) 160-4.5 MCG/ACT inhaler  2 times daily     06/15/17 1700   06/15/17 0000  Increase activity slowly     06/15/17 1700   06/15/17 0000  Diet - low sodium heart healthy     06/15/17 1700   06/15/17 0000  blood glucose meter kit and supplies KIT    Question Answer Comment  Number of strips 30   Number of lancets 30      06/15/17 1702      Allergies  Allergen Reactions  . Codeine Nausea And Vomiting and Other (See Comments)    Other reaction(s): Flushing (ALLERGY/intolerance) fainting    Consultations:     Procedures/Studies: Dg Chest 2 View  Result Date: 06/13/2017 CLINICAL DATA:  Shortness of breath EXAM: CHEST  2 VIEW COMPARISON:  12/07/2015 FINDINGS: The lungs are hyperinflated likely secondary to COPD. There is no focal parenchymal opacity. There is no pleural effusion or pneumothorax. The heart and mediastinal contours are unremarkable. The osseous structures are unremarkable. IMPRESSION: No active cardiopulmonary disease. Electronically Signed    By: Kathreen Devoid   On: 06/13/2017 12:37       Subjective: Patient is feeling better. Feels that his respiratory status is approaching baseline.  Discharge Exam: Vitals:   06/15/17 1300 06/15/17 1454  BP: 113/71   Pulse: 79   Resp: 20   Temp: 97.9 F (36.6 C)   SpO2: 96% 96%   Vitals:   06/15/17 0815 06/15/17 0821 06/15/17 1300 06/15/17 1454  BP:   113/71   Pulse:   79   Resp:   20   Temp:   97.9 F (36.6 C)   TempSrc:   Oral   SpO2: (!) 80% (!) 80% 96% 96%  Weight:      Height:        General: Pt is alert, awake, not in acute distress Cardiovascular: RRR, S1/S2 +, no rubs, no gallops Respiratory: Mild wheeze bilaterally with fair air movement Abdominal: Soft, NT, ND, bowel sounds + Extremities: no edema, no cyanosis    The results of significant diagnostics from this hospitalization (including imaging, microbiology, ancillary and laboratory) are listed below for reference.     Microbiology: No results found for this or any previous visit (from the past 240 hour(s)).   Labs: BNP (last 3 results)  Recent Labs  06/13/17 1159  BNP 08.6   Basic Metabolic Panel:  Recent Labs Lab 06/13/17 1159 06/14/17 0705  NA 139 138  K 4.1 4.0  CL 103 101  CO2 26 29  GLUCOSE 105* 158*  BUN 13 28*  CREATININE 0.85 0.99  CALCIUM 10.1 10.3   Liver Function Tests:  Recent Labs Lab 06/13/17 1159  AST 16  ALT 10*  ALKPHOS 67  BILITOT 0.6  PROT 7.2  ALBUMIN 4.0   No results for input(s): LIPASE, AMYLASE in the last 168 hours. No results for input(s): AMMONIA in the last 168 hours. CBC:  Recent Labs Lab 06/13/17 1159 06/14/17 0705  WBC 10.0 17.3*  NEUTROABS 7.7  --   HGB 16.2 15.2  HCT 48.0 45.4  MCV 93.2 93.0  PLT 205 222   Cardiac Enzymes:  Recent Labs Lab 06/13/17 1159  TROPONINI <0.03   BNP: Invalid input(s): POCBNP CBG: No results for input(s): GLUCAP in the last 168 hours. D-Dimer No results for input(s): DDIMER in the last 72  hours. Hgb A1c No results for input(s): HGBA1C in the last 72 hours. Lipid Profile No results for input(s): CHOL, HDL, LDLCALC, TRIG, CHOLHDL, LDLDIRECT in the last 72 hours. Thyroid function studies No results for input(s): TSH, T4TOTAL, T3FREE, THYROIDAB in the last 72 hours.  Invalid input(s): FREET3 Anemia work up No results for input(s): VITAMINB12, FOLATE, FERRITIN, TIBC, IRON, RETICCTPCT in the last 72 hours. Urinalysis No results found for: COLORURINE, APPEARANCEUR, LABSPEC, [place], GLUCOSEU, HGBUR, BILIRUBINUR, KETONESUR, PROTEINUR, UROBILINOGEN, NITRITE, LEUKOCYTESUR Sepsis Labs Invalid input(s): PROCALCITONIN,  WBC,  LACTICIDVEN Microbiology No results found for this or any previous visit (from the past 240 hour(s)).   Time coordinating discharge: Over 30 minutes  SIGNED:   Kathie Dike, MD  [facility] 06/15/2017, 5:03 PM Pager   If 7PM-7AM, please contact night-coverage [email or web address] Password [email or web address]

## 2017-06-15 NOTE — Progress Notes (Signed)
Discharge instruction read to patient by Verline Lema  RN  Discharged to home with family

## 2017-06-21 ENCOUNTER — Other Ambulatory Visit: Payer: Self-pay

## 2017-06-21 NOTE — Patient Outreach (Signed)
[place] [facility]) Care Management  06/21/2017  TYRI ELMORE 1951-02-18 [id number]       EMMI-COPD RED ON EMMI ALERT Day # 1 Date: 06/20/17 Red Alert Reason: "Scheduled a follow up appt? No   Filled new prescriptions? No"     Outreach attempt # 1 to patient. Spouse answered the phone. No ROI on file for spouse. Advised that RN CM would call back another time.          Plan: RN CM will make outreach attempt to patient within one business day.    Enzo Montgomery, RN,BSN,CCM [place] [facility] Telephonic Care Management Coordinator Direct Phone: [phone] Toll Free: [phone] Fax: [phone]

## 2017-06-22 ENCOUNTER — Other Ambulatory Visit: Payer: Self-pay

## 2017-06-22 NOTE — Patient Outreach (Signed)
[place] [facility]) Care Management  06/22/2017  MERTON WADLOW [birth year] [id number]     EMMI-COPD RED ON EMMI ALERT Day # 1 Date: 06/20/17 Red Alert Reason: "Scheduled a follow up appt? No   Filled new prescriptions? No"   Outreach attempt #2 to patient. Spoke with spouse who states patient is not at home at present. RN CM left contact info with spouse for patient to return call. Spouse states that patient needs to speak with someone regarding one his prescriptions.      Plan: RN CM will await return call form patient.   Enzo Montgomery, RN,BSN,CCM [place] [facility] Telephonic Care Management Coordinator Direct Phone: [phone] Toll Free: [phone] Fax: [phone]

## 2017-06-23 ENCOUNTER — Other Ambulatory Visit: Payer: Self-pay

## 2017-06-23 NOTE — Patient Outreach (Signed)
[place] [facility]) Care Management  06/23/2017  KORAY SOTER December 13, 1950 [id number]    Care Coordination    RN CM received voicemail message from [place] at Dr. Juel Burrow office. Staff did contact and f/u with patient. They were able to get patient to agree to MD office f/u visit for 06/27/17. In regards to patient's Spiriva staff has contacted [facility], hospital and office should hear back with 24-48hrs of rather or not patient approved for med. No further RN CM interventions warranted at this time.      Enzo Montgomery, RN,BSN,CCM [place] [facility] Telephonic Care Management Coordinator Direct Phone: [phone] Toll Free: [phone] Fax: [phone]

## 2017-06-23 NOTE — Patient Outreach (Signed)
[place] [facility]) Care Management  06/23/2017  Martin Santiago 1950-12-23 [id number]   EMMI-COPD RED ON EMMI ALERT Day # 1 Date: 06/20/17 Red Alert Reason: "Scheduled a follow up appt? No Filled new prescriptions? No"   Voicemail message received from patient. Return call placed to patient. Spoke with patient. Reviewed and addressed red alerts. Patient states he has not made PCP f/u appt as he was not advised to do so and he does not feel like it would be beneficial. RN CM educated patient on the importance of PCP f/u within 14 days of discharge from the hospital. Patient states that he was unable to get Spiriva from pharmacy as it required a prior authorization from MD. He voices that he did pick up Symbicort but has not used it as he does not "feel like he needs it." He voices that his breathing has been managed and under control. Reviewed med instructions and how med is ordered to be taken 2x/daily. Patient states he is fearful of taking inhalers as he is putting "air in his lungs" and is afraid it will cause him to stop breathing. RN CM educated patient on purpose of inhalers. He does voice that he is using albuterol neb txs 3-4x/day. He complains of dry/sore mouth. Encouraged patient to perform mouth/oral care after each treatment. He voices that he has completed antibiotics and is almost done with steroids. Patient agreeable to RN CM contacting PCP office to advise them of recent hospitalization and current med issues. No further RN CM needs or concerns at this time. Advised patient that they would continue to get automated EMMI- COPD post discharge calls to assess how they are doing following recent hospitalization and will receive a call from a nurse if any of their responses were abnormal. Patient voiced understanding and was appreciative of f/u call.  RN CM made outreach call to PCP office. Spoke with Caryl Pina and advised of the above. MD office staff will call and follow up with  patient and get patient scheduled for f/u appt.     Plan: RN CM will notify [facility] administrative assistant of case status.    Enzo Montgomery, RN,BSN,CCM [place] Management Telephonic Care Management Coordinator Direct Phone: [phone] Toll Free: [phone] Fax: [phone]

## 2017-06-27 DIAGNOSIS — I25119 Atherosclerotic heart disease of native coronary artery with unspecified angina pectoris: Secondary | ICD-10-CM | POA: Diagnosis not present

## 2017-06-27 DIAGNOSIS — J449 Chronic obstructive pulmonary disease, unspecified: Secondary | ICD-10-CM | POA: Diagnosis not present

## 2017-06-27 DIAGNOSIS — I10 Essential (primary) hypertension: Secondary | ICD-10-CM | POA: Diagnosis not present

## 2017-06-27 DIAGNOSIS — Z720 Tobacco use: Secondary | ICD-10-CM | POA: Diagnosis not present

## 2017-06-27 DIAGNOSIS — Z6820 Body mass index (BMI) 20.0-20.9, adult: Secondary | ICD-10-CM | POA: Diagnosis not present

## 2017-06-27 DIAGNOSIS — Z0000 Encounter for general adult medical examination without abnormal findings: Secondary | ICD-10-CM | POA: Diagnosis not present

## 2017-08-06 IMAGING — DX DG CHEST 2V
2 series · 2 of 2 positions shown · non-contrast
Comparison: None.

CLINICAL DATA: Cough fever shortness of breath for 4 days

EXAM:
CHEST  2 VIEW

[chest pa]
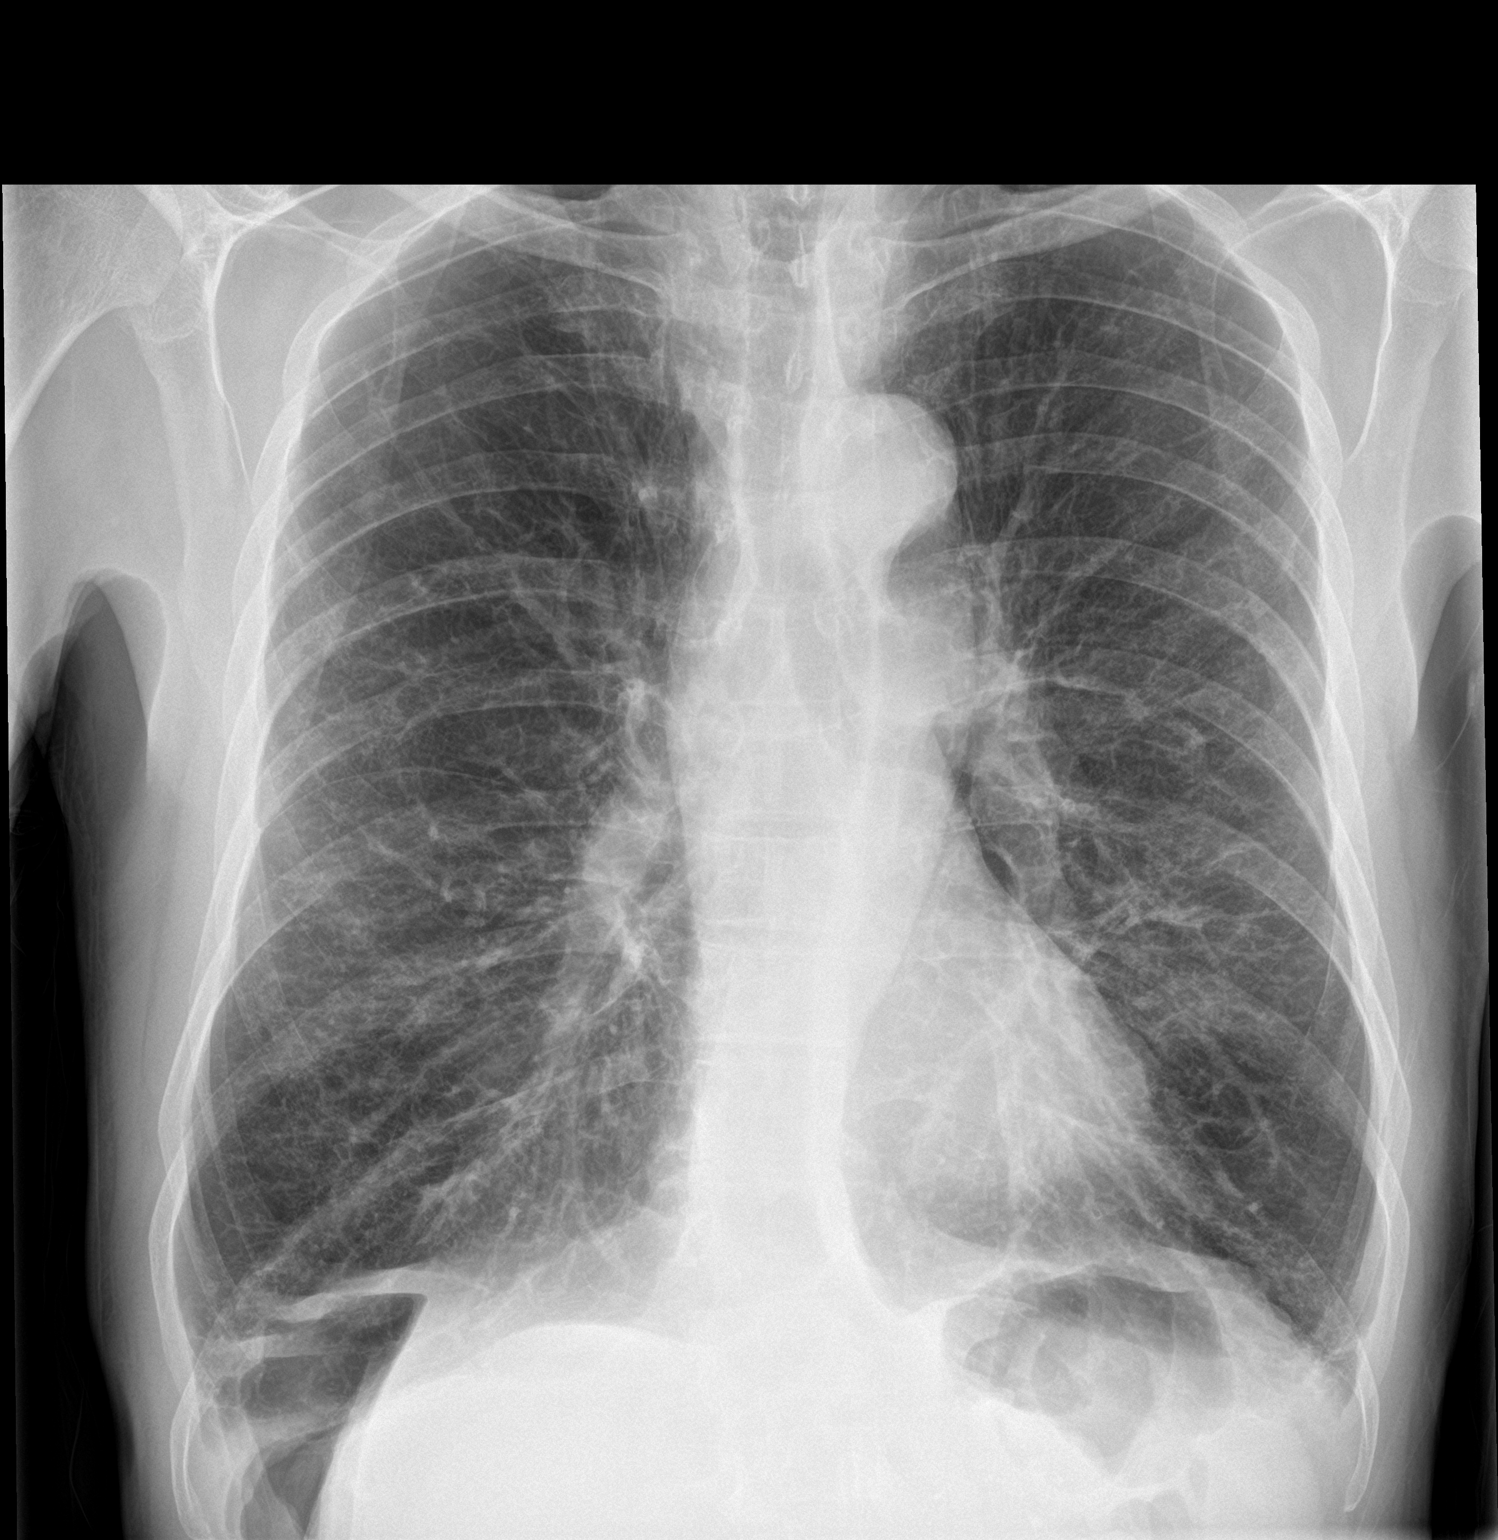

[chest lat]
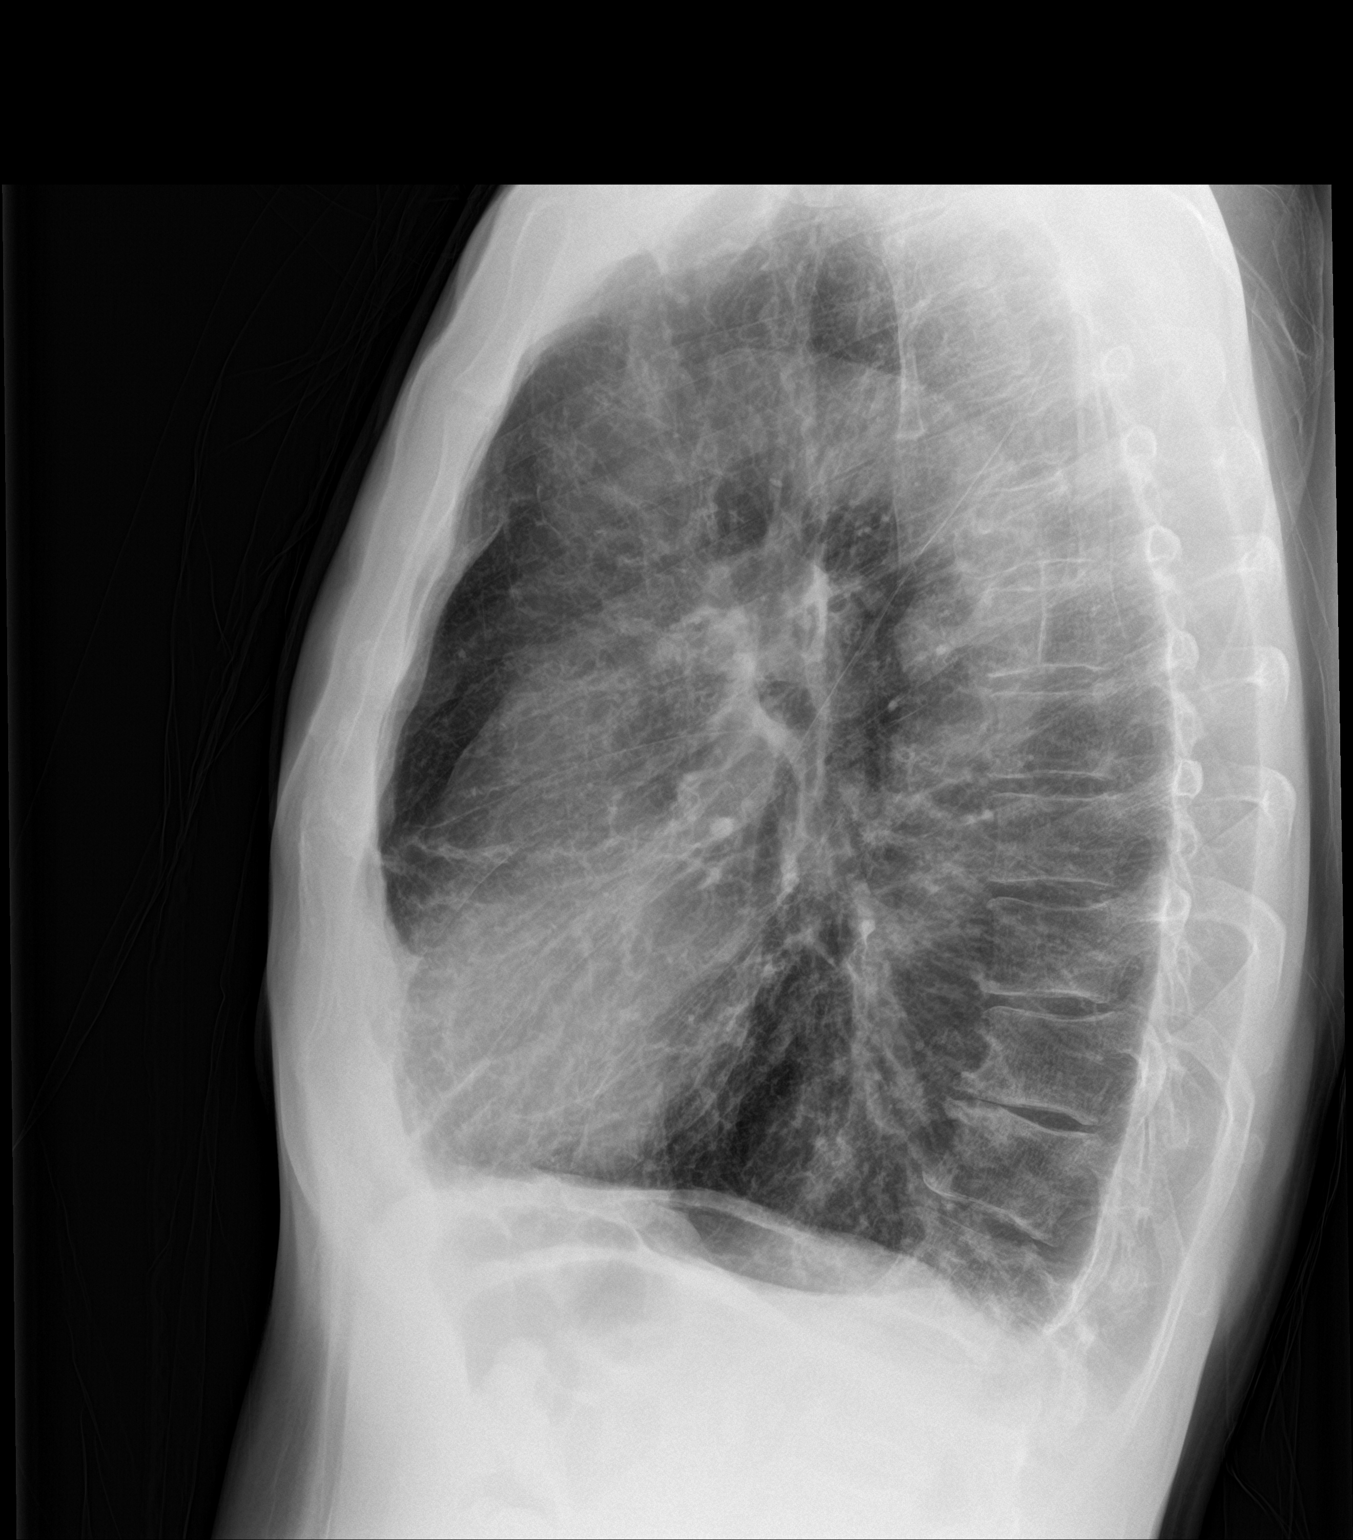

[2 of 2 positions shown; findings below may reference images not displayed]

FINDINGS: Hyperinflation consistent with COPD. The heart size and vascular
pattern are normal. There is mild diffuse interstitial fibrosis.
Subtle reticular nodular pattern possible laterally in the right
middle lobe and left midlung zone. There appears to be gas beneath
the diaphragm on the right likely representing interposition of the
colon. No pleural effusion.
IMPRESSION: COPD with interstitial fibrosis. Questionable reticular nodular
pattern both midlung zones may be chronic as well. Without the
benefit of comparison, it is difficult to exclude the possibility of
atypical pneumonia. This would be evaluated in greater detail with
CT thorax.

## 2017-08-08 DIAGNOSIS — I10 Essential (primary) hypertension: Secondary | ICD-10-CM | POA: Diagnosis not present

## 2017-08-10 DIAGNOSIS — I25119 Atherosclerotic heart disease of native coronary artery with unspecified angina pectoris: Secondary | ICD-10-CM | POA: Diagnosis not present

## 2017-08-10 DIAGNOSIS — Z720 Tobacco use: Secondary | ICD-10-CM | POA: Diagnosis not present

## 2017-08-10 DIAGNOSIS — I10 Essential (primary) hypertension: Secondary | ICD-10-CM | POA: Diagnosis not present

## 2017-08-10 DIAGNOSIS — J449 Chronic obstructive pulmonary disease, unspecified: Secondary | ICD-10-CM | POA: Diagnosis not present

## 2017-08-10 DIAGNOSIS — Z6820 Body mass index (BMI) 20.0-20.9, adult: Secondary | ICD-10-CM | POA: Diagnosis not present

## 2017-10-24 ENCOUNTER — Encounter: Payer: Self-pay | Admitting: Internal Medicine

## 2018-06-07 DIAGNOSIS — H2702 Aphakia, left eye: Secondary | ICD-10-CM | POA: Diagnosis not present

## 2018-06-07 DIAGNOSIS — H25811 Combined forms of age-related cataract, right eye: Secondary | ICD-10-CM | POA: Diagnosis not present

## 2018-06-07 DIAGNOSIS — H33022 Retinal detachment with multiple breaks, left eye: Secondary | ICD-10-CM | POA: Diagnosis not present

## 2018-07-05 DIAGNOSIS — I2510 Atherosclerotic heart disease of native coronary artery without angina pectoris: Secondary | ICD-10-CM | POA: Diagnosis not present

## 2018-07-05 DIAGNOSIS — Z0000 Encounter for general adult medical examination without abnormal findings: Secondary | ICD-10-CM | POA: Diagnosis not present

## 2018-07-05 DIAGNOSIS — Z720 Tobacco use: Secondary | ICD-10-CM | POA: Diagnosis not present

## 2018-07-05 DIAGNOSIS — Z6820 Body mass index (BMI) 20.0-20.9, adult: Secondary | ICD-10-CM | POA: Diagnosis not present

## 2018-07-05 DIAGNOSIS — H33002 Unspecified retinal detachment with retinal break, left eye: Secondary | ICD-10-CM | POA: Diagnosis not present

## 2018-07-05 DIAGNOSIS — I10 Essential (primary) hypertension: Secondary | ICD-10-CM | POA: Diagnosis not present

## 2018-07-05 DIAGNOSIS — L918 Other hypertrophic disorders of the skin: Secondary | ICD-10-CM | POA: Diagnosis not present

## 2018-07-05 DIAGNOSIS — J449 Chronic obstructive pulmonary disease, unspecified: Secondary | ICD-10-CM | POA: Diagnosis not present

## 2018-07-10 DIAGNOSIS — J449 Chronic obstructive pulmonary disease, unspecified: Secondary | ICD-10-CM | POA: Diagnosis not present

## 2018-07-10 DIAGNOSIS — I10 Essential (primary) hypertension: Secondary | ICD-10-CM | POA: Diagnosis not present

## 2018-07-10 DIAGNOSIS — Z6820 Body mass index (BMI) 20.0-20.9, adult: Secondary | ICD-10-CM | POA: Diagnosis not present

## 2018-07-10 DIAGNOSIS — I25119 Atherosclerotic heart disease of native coronary artery with unspecified angina pectoris: Secondary | ICD-10-CM | POA: Diagnosis not present

## 2018-07-10 DIAGNOSIS — Z720 Tobacco use: Secondary | ICD-10-CM | POA: Diagnosis not present

## 2018-07-10 DIAGNOSIS — Z0000 Encounter for general adult medical examination without abnormal findings: Secondary | ICD-10-CM | POA: Diagnosis not present

## 2018-07-10 DIAGNOSIS — Z125 Encounter for screening for malignant neoplasm of prostate: Secondary | ICD-10-CM | POA: Diagnosis not present

## 2018-08-09 DIAGNOSIS — Z23 Encounter for immunization: Secondary | ICD-10-CM | POA: Diagnosis not present

## 2018-12-26 DIAGNOSIS — J449 Chronic obstructive pulmonary disease, unspecified: Secondary | ICD-10-CM | POA: Diagnosis not present

## 2018-12-26 DIAGNOSIS — I10 Essential (primary) hypertension: Secondary | ICD-10-CM | POA: Diagnosis not present

## 2018-12-26 DIAGNOSIS — I25119 Atherosclerotic heart disease of native coronary artery with unspecified angina pectoris: Secondary | ICD-10-CM | POA: Diagnosis not present

## 2019-01-21 DIAGNOSIS — I10 Essential (primary) hypertension: Secondary | ICD-10-CM | POA: Diagnosis not present

## 2019-01-21 DIAGNOSIS — I2510 Atherosclerotic heart disease of native coronary artery without angina pectoris: Secondary | ICD-10-CM | POA: Diagnosis not present

## 2019-01-24 DIAGNOSIS — K219 Gastro-esophageal reflux disease without esophagitis: Secondary | ICD-10-CM | POA: Diagnosis not present

## 2019-01-24 DIAGNOSIS — I25119 Atherosclerotic heart disease of native coronary artery with unspecified angina pectoris: Secondary | ICD-10-CM | POA: Diagnosis not present

## 2019-01-24 DIAGNOSIS — F17218 Nicotine dependence, cigarettes, with other nicotine-induced disorders: Secondary | ICD-10-CM | POA: Diagnosis not present

## 2019-01-24 DIAGNOSIS — H33002 Unspecified retinal detachment with retinal break, left eye: Secondary | ICD-10-CM | POA: Diagnosis not present

## 2019-01-24 DIAGNOSIS — J449 Chronic obstructive pulmonary disease, unspecified: Secondary | ICD-10-CM | POA: Diagnosis not present

## 2019-01-24 DIAGNOSIS — M25512 Pain in left shoulder: Secondary | ICD-10-CM | POA: Diagnosis not present

## 2019-01-24 DIAGNOSIS — I10 Essential (primary) hypertension: Secondary | ICD-10-CM | POA: Diagnosis not present

## 2019-01-25 DIAGNOSIS — I10 Essential (primary) hypertension: Secondary | ICD-10-CM | POA: Diagnosis not present

## 2019-01-25 DIAGNOSIS — J449 Chronic obstructive pulmonary disease, unspecified: Secondary | ICD-10-CM | POA: Diagnosis not present

## 2019-01-25 DIAGNOSIS — I25119 Atherosclerotic heart disease of native coronary artery with unspecified angina pectoris: Secondary | ICD-10-CM | POA: Diagnosis not present

## 2019-02-11 IMAGING — DX DG CHEST 2V
2 series · 2 of 2 positions shown · non-contrast
Comparison: 12/07/2015

CLINICAL DATA: Shortness of breath

EXAM:
CHEST  2 VIEW

[chest pa]
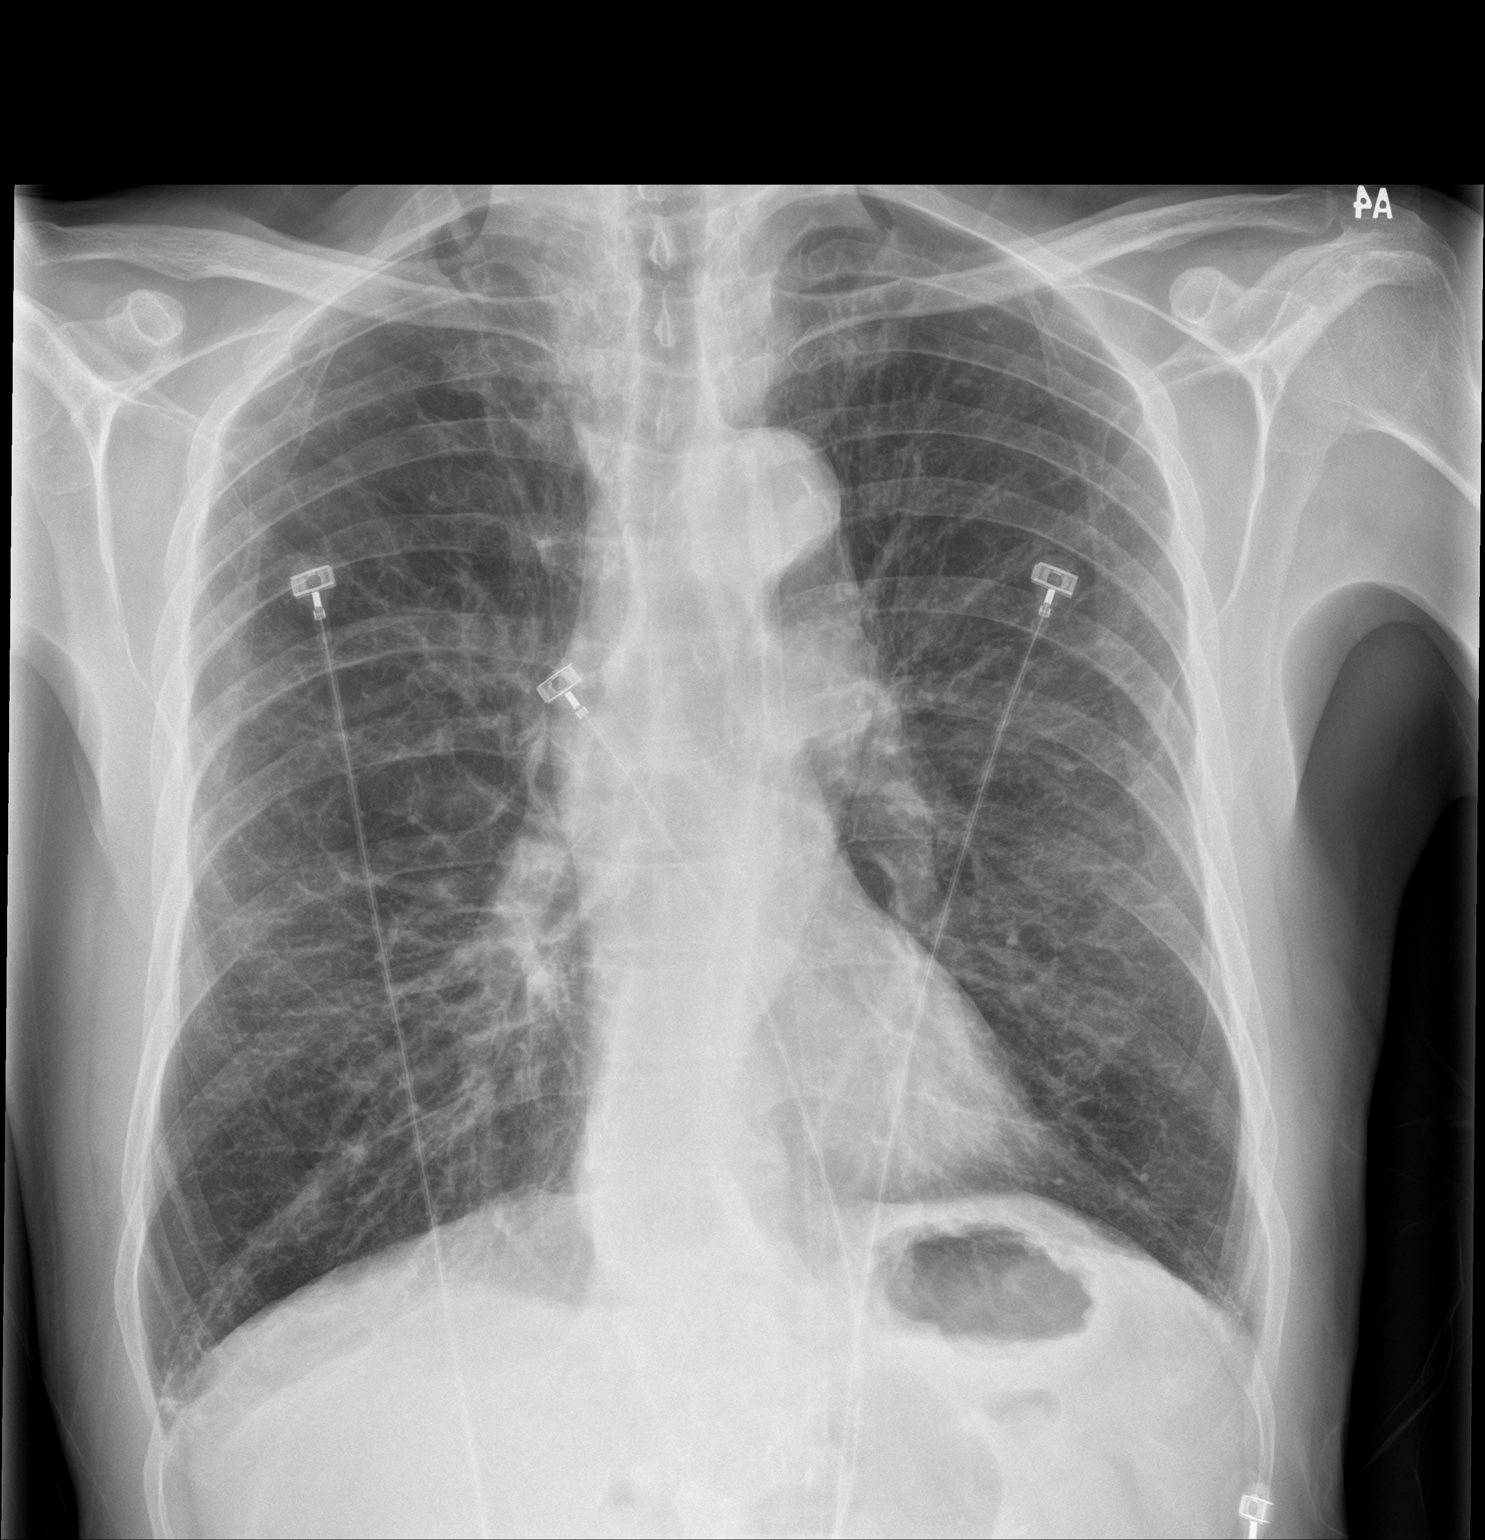

[chest lat]
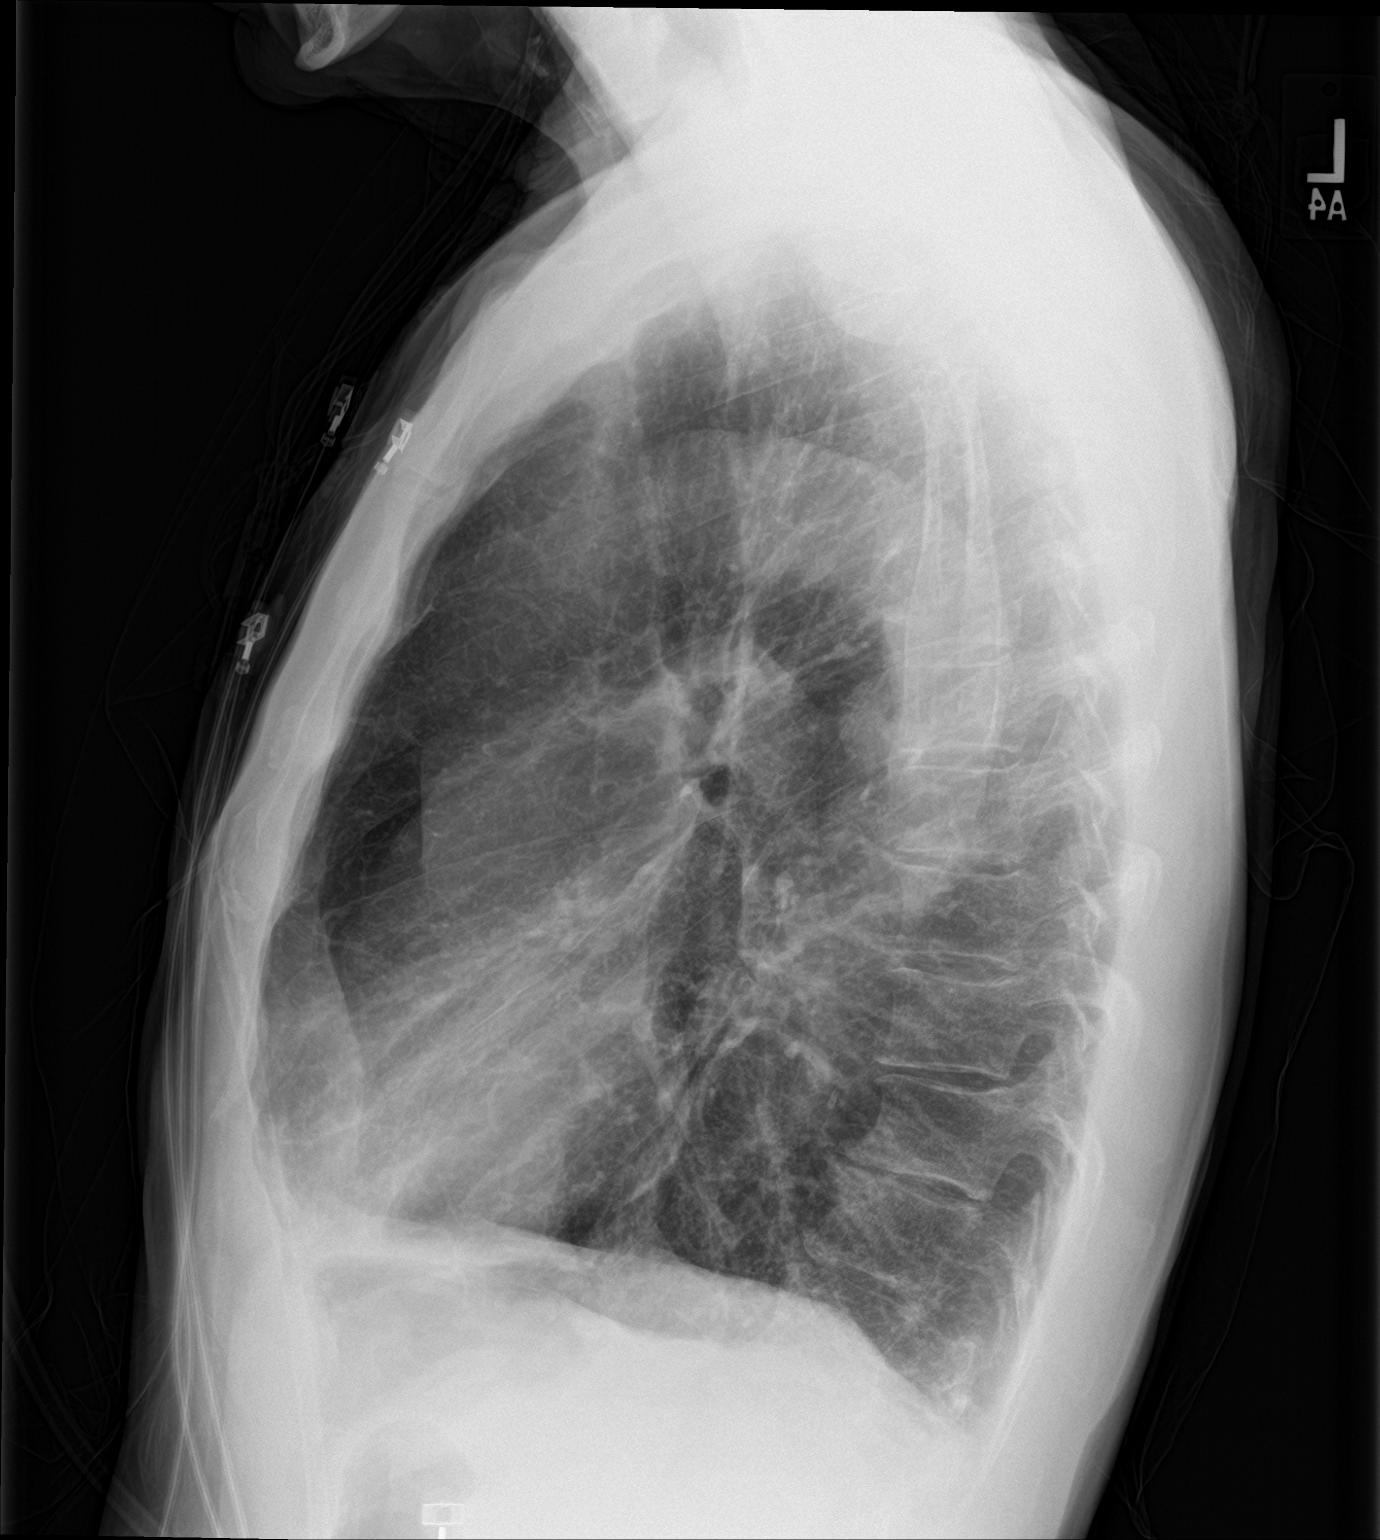

[2 of 2 positions shown; findings below may reference images not displayed]

FINDINGS: The lungs are hyperinflated likely secondary to COPD. There is no
focal parenchymal opacity. There is no pleural effusion or
pneumothorax. The heart and mediastinal contours are unremarkable.

The osseous structures are unremarkable.
IMPRESSION: No active cardiopulmonary disease.

## 2019-03-08 ENCOUNTER — Ambulatory Visit (INDEPENDENT_AMBULATORY_CARE_PROVIDER_SITE_OTHER): Payer: Medicare Other | Admitting: Orthopedic Surgery

## 2019-03-08 ENCOUNTER — Ambulatory Visit (INDEPENDENT_AMBULATORY_CARE_PROVIDER_SITE_OTHER): Payer: Medicare Other

## 2019-03-08 ENCOUNTER — Other Ambulatory Visit: Payer: Self-pay

## 2019-03-08 ENCOUNTER — Encounter: Payer: Self-pay | Admitting: Orthopedic Surgery

## 2019-03-08 VITALS — BP 148/96 | HR 66 | Temp 97.2°F | Ht 66.0 in | Wt 146.0 lb

## 2019-03-08 DIAGNOSIS — M25512 Pain in left shoulder: Secondary | ICD-10-CM

## 2019-03-08 DIAGNOSIS — G8929 Other chronic pain: Secondary | ICD-10-CM | POA: Diagnosis not present

## 2019-03-08 NOTE — Progress Notes (Signed)
NEW PROBLEM OFFICE VISIT  Chief Complaint  Patient presents with  . Shoulder Pain    left     68 year old male may have injured her shoulder taken out the garbage 6 to 8 weeks ago presents for evaluation and treatment of left shoulder pain and weakness which is nonradiating located over the proximal left arm and deltoid with pain at 90 degrees but he can lift his arm up to 180 degrees.  He did take some meloxicam which did help with his pain but it has sort of stabilized and not gotten any better.   Review of Systems  Constitutional: Negative for chills and fever.  Musculoskeletal: Negative for neck pain.  Neurological: Negative for tingling.     Past Medical History:  Diagnosis Date  . Colon polyps   . COPD (chronic obstructive pulmonary disease) ([place])   . Crohn's disease ([place])   . Detached retina   . Essential hypertension   . Tropical sprue     Past Surgical History:  Procedure Laterality Date  . BOWEL RESECTION  05/1994   [place]:  ? Crohn's  . COLONOSCOPY     Dr Derrill [facility], [facility]:  Hx polyps. Cannot remmeber date  . COLONOSCOPY N/A 12/03/2012   Procedure: COLONOSCOPY;  Surgeon: Daneil Dolin, MD;  Location: [facility];  Service: Endoscopy;  Laterality: N/A;  7:30  . INGUINAL HERNIA REPAIR Right   . RETINAL DETACHMENT SURGERY  07/2006  . RETINAL DETACHMENT SURGERY  09/2006  . RETINAL DETACHMENT SURGERY  01/2007    Family History  Problem Relation Age of Onset  . Colon cancer Maternal Aunt   . Breast cancer Mother    Social History   Tobacco Use  . Smoking status: Current Every Day Smoker    Packs/day: 1.50    Years: 45.00    Pack years: 67.50    Types: Cigarettes  . Smokeless tobacco: Never Used  Substance Use Topics  . Alcohol use: Yes    Alcohol/week: 0.0 standard drinks    Comment: Rare, 3-4 per yr  . Drug use: No    Comment: Remote marijuana    Allergies  Allergen Reactions  . Codeine Nausea And Vomiting and Other (See Comments)    Other  reaction(s): Flushing (ALLERGY/intolerance) fainting    Current Meds  Medication Sig  . amLODipine (NORVASC) 10 MG tablet Take 10 mg by mouth daily. Reported on 02/25/2016  . BEVESPI AEROSPHERE 9-4.8 MCG/ACT AERO   . meloxicam (MOBIC) 15 MG tablet   . omeprazole (PRILOSEC) 20 MG capsule     BP (!) 148/96   Pulse 66   Temp (!) 97.2 F (36.2 C)   Ht 5\' 6"  (1.676 m)   Wt 146 lb (66.2 kg)   BMI 23.57 kg/m   Physical Exam Vitals signs and nursing note reviewed.  Constitutional:      Appearance: Normal appearance.  Neurological:     Mental Status: He is alert and oriented to person, place, and time.  Psychiatric:        Mood and Affect: Mood normal.     Right Shoulder Exam  Right shoulder exam is normal.  Tenderness  The patient is experiencing no tenderness.  Range of Motion  The patient has normal right shoulder ROM.  Muscle Strength  The patient has normal right shoulder strength.  Tests  Apprehension: negative  Other  Erythema: absent Sensation: normal Pulse: present   Left Shoulder Exam  Left shoulder exam is normal.  Tenderness  The patient is experiencing no tenderness.   Range of Motion  The patient has normal left shoulder ROM.  Muscle Strength  The patient has normal left shoulder strength. Abduction: 4/5  Supraspinatus: 4/5   Tests  Apprehension: negative Impingement: positive Drop arm: negative  Other  Erythema: absent Sensation: normal Pulse: present         MEDICAL DECISION SECTION  Xrays were done at [facility] see report  My independent reading of xrays:  Chronic sclerosis and cyst in the greater tuberosity and proximal migration of the humeral head suggesting chronic rotator cuff tear  Encounter Diagnosis  Name Primary?  . Chronic left shoulder pain Yes    PLAN: (Rx., injectx, surgery, frx, mri/ct) Injection subacromial space Home physical therapy Continue meloxicam  Procedure note the subacromial  injection shoulder left   Verbal consent was obtained to inject the  Left   Shoulder  Timeout was completed to confirm the injection site is a subacromial space of the  left  shoulder  Medication used Depo-Medrol 40 mg and lidocaine 1% 3 cc  Anesthesia was provided by ethyl chloride  The injection was performed in the left  posterior subacromial space. After pinning the skin with alcohol and anesthetized the skin with ethyl chloride the subacromial space was injected using a 20-gauge needle. There were no complications  Sterile dressing was applied.    No orders of the defined types were placed in this encounter.   Arther Abbott, MD  03/08/2019 10:15 AM

## 2019-03-08 NOTE — Patient Instructions (Signed)
You have received an injection of steroids into the joint. 15% of patients will have increased pain within the 24 hours postinjection.   This is transient and will go away.   We recommend that you use ice packs on the injection site for 20 minutes every 2 hours and extra strength Tylenol 2 tablets every 8 as needed until the pain resolves.  If you continue to have pain after taking the Tylenol and using the ice please call the office for further instructions.  Continue meloxicam  Do exercises for 6 weeks

## 2019-03-13 DIAGNOSIS — I10 Essential (primary) hypertension: Secondary | ICD-10-CM | POA: Diagnosis not present

## 2019-03-13 DIAGNOSIS — I25119 Atherosclerotic heart disease of native coronary artery with unspecified angina pectoris: Secondary | ICD-10-CM | POA: Diagnosis not present

## 2019-03-13 DIAGNOSIS — J449 Chronic obstructive pulmonary disease, unspecified: Secondary | ICD-10-CM | POA: Diagnosis not present

## 2019-04-08 DIAGNOSIS — J449 Chronic obstructive pulmonary disease, unspecified: Secondary | ICD-10-CM | POA: Diagnosis not present

## 2019-04-08 DIAGNOSIS — I25119 Atherosclerotic heart disease of native coronary artery with unspecified angina pectoris: Secondary | ICD-10-CM | POA: Diagnosis not present

## 2019-04-08 DIAGNOSIS — I10 Essential (primary) hypertension: Secondary | ICD-10-CM | POA: Diagnosis not present

## 2019-05-03 ENCOUNTER — Ambulatory Visit (INDEPENDENT_AMBULATORY_CARE_PROVIDER_SITE_OTHER): Payer: Medicare Other | Admitting: Orthopedic Surgery

## 2019-05-03 ENCOUNTER — Encounter: Payer: Self-pay | Admitting: Orthopedic Surgery

## 2019-05-03 ENCOUNTER — Other Ambulatory Visit: Payer: Self-pay

## 2019-05-03 VITALS — Temp 98.1°F | Resp 18 | Ht 66.0 in | Wt 150.0 lb

## 2019-05-03 DIAGNOSIS — M25512 Pain in left shoulder: Secondary | ICD-10-CM | POA: Diagnosis not present

## 2019-05-03 DIAGNOSIS — G8929 Other chronic pain: Secondary | ICD-10-CM

## 2019-05-03 NOTE — Progress Notes (Signed)
Chief Complaint  Patient presents with  . Shoulder Pain    improving but still has some pain at night     68 year old male injured his shoulder lifting/taking out the garbage  We kept him on meloxicam and gave him an exercise program and a cortisone shot.  He says he did not really do the exercises but his shoulder got better  He now has full forward elevation  Impression acute shoulder pain resolved  Follow-up as needed  I recommended that he do the exercises

## 2019-05-08 DIAGNOSIS — J449 Chronic obstructive pulmonary disease, unspecified: Secondary | ICD-10-CM | POA: Diagnosis not present

## 2019-05-08 DIAGNOSIS — I25119 Atherosclerotic heart disease of native coronary artery with unspecified angina pectoris: Secondary | ICD-10-CM | POA: Diagnosis not present

## 2019-05-08 DIAGNOSIS — I10 Essential (primary) hypertension: Secondary | ICD-10-CM | POA: Diagnosis not present

## 2019-06-07 DIAGNOSIS — I25119 Atherosclerotic heart disease of native coronary artery with unspecified angina pectoris: Secondary | ICD-10-CM | POA: Diagnosis not present

## 2019-06-07 DIAGNOSIS — J449 Chronic obstructive pulmonary disease, unspecified: Secondary | ICD-10-CM | POA: Diagnosis not present

## 2019-06-07 DIAGNOSIS — I10 Essential (primary) hypertension: Secondary | ICD-10-CM | POA: Diagnosis not present

## 2019-06-28 DIAGNOSIS — Z23 Encounter for immunization: Secondary | ICD-10-CM | POA: Diagnosis not present

## 2019-07-05 DIAGNOSIS — I10 Essential (primary) hypertension: Secondary | ICD-10-CM | POA: Diagnosis not present

## 2019-07-05 DIAGNOSIS — J449 Chronic obstructive pulmonary disease, unspecified: Secondary | ICD-10-CM | POA: Diagnosis not present

## 2019-07-05 DIAGNOSIS — I25119 Atherosclerotic heart disease of native coronary artery with unspecified angina pectoris: Secondary | ICD-10-CM | POA: Diagnosis not present

## 2019-07-30 DIAGNOSIS — Z1329 Encounter for screening for other suspected endocrine disorder: Secondary | ICD-10-CM | POA: Diagnosis not present

## 2019-07-30 DIAGNOSIS — K219 Gastro-esophageal reflux disease without esophagitis: Secondary | ICD-10-CM | POA: Diagnosis not present

## 2019-07-30 DIAGNOSIS — I25119 Atherosclerotic heart disease of native coronary artery with unspecified angina pectoris: Secondary | ICD-10-CM | POA: Diagnosis not present

## 2019-07-30 DIAGNOSIS — J449 Chronic obstructive pulmonary disease, unspecified: Secondary | ICD-10-CM | POA: Diagnosis not present

## 2019-07-30 DIAGNOSIS — I10 Essential (primary) hypertension: Secondary | ICD-10-CM | POA: Diagnosis not present

## 2019-08-05 DIAGNOSIS — J449 Chronic obstructive pulmonary disease, unspecified: Secondary | ICD-10-CM | POA: Diagnosis not present

## 2019-08-05 DIAGNOSIS — M25512 Pain in left shoulder: Secondary | ICD-10-CM | POA: Diagnosis not present

## 2019-08-05 DIAGNOSIS — I25119 Atherosclerotic heart disease of native coronary artery with unspecified angina pectoris: Secondary | ICD-10-CM | POA: Diagnosis not present

## 2019-08-05 DIAGNOSIS — F17218 Nicotine dependence, cigarettes, with other nicotine-induced disorders: Secondary | ICD-10-CM | POA: Diagnosis not present

## 2019-08-05 DIAGNOSIS — H33002 Unspecified retinal detachment with retinal break, left eye: Secondary | ICD-10-CM | POA: Diagnosis not present

## 2019-08-05 DIAGNOSIS — I10 Essential (primary) hypertension: Secondary | ICD-10-CM | POA: Diagnosis not present

## 2019-08-05 DIAGNOSIS — K219 Gastro-esophageal reflux disease without esophagitis: Secondary | ICD-10-CM | POA: Diagnosis not present

## 2019-10-07 DIAGNOSIS — K219 Gastro-esophageal reflux disease without esophagitis: Secondary | ICD-10-CM | POA: Diagnosis not present

## 2019-10-07 DIAGNOSIS — M25512 Pain in left shoulder: Secondary | ICD-10-CM | POA: Diagnosis not present

## 2019-10-07 DIAGNOSIS — I25119 Atherosclerotic heart disease of native coronary artery with unspecified angina pectoris: Secondary | ICD-10-CM | POA: Diagnosis not present

## 2019-10-07 DIAGNOSIS — F17218 Nicotine dependence, cigarettes, with other nicotine-induced disorders: Secondary | ICD-10-CM | POA: Diagnosis not present

## 2019-10-07 DIAGNOSIS — H33002 Unspecified retinal detachment with retinal break, left eye: Secondary | ICD-10-CM | POA: Diagnosis not present

## 2019-10-07 DIAGNOSIS — J449 Chronic obstructive pulmonary disease, unspecified: Secondary | ICD-10-CM | POA: Diagnosis not present

## 2019-10-07 DIAGNOSIS — I10 Essential (primary) hypertension: Secondary | ICD-10-CM | POA: Diagnosis not present

## 2019-10-11 DIAGNOSIS — D1801 Hemangioma of skin and subcutaneous tissue: Secondary | ICD-10-CM | POA: Diagnosis not present

## 2019-10-11 DIAGNOSIS — D2271 Melanocytic nevi of right lower limb, including hip: Secondary | ICD-10-CM | POA: Diagnosis not present

## 2019-10-11 DIAGNOSIS — D2261 Melanocytic nevi of right upper limb, including shoulder: Secondary | ICD-10-CM | POA: Diagnosis not present

## 2019-10-11 DIAGNOSIS — L821 Other seborrheic keratosis: Secondary | ICD-10-CM | POA: Diagnosis not present

## 2019-10-11 DIAGNOSIS — D485 Neoplasm of uncertain behavior of skin: Secondary | ICD-10-CM | POA: Diagnosis not present

## 2019-10-11 DIAGNOSIS — L720 Epidermal cyst: Secondary | ICD-10-CM | POA: Diagnosis not present

## 2019-10-11 DIAGNOSIS — D225 Melanocytic nevi of trunk: Secondary | ICD-10-CM | POA: Diagnosis not present

## 2019-10-17 DIAGNOSIS — H2702 Aphakia, left eye: Secondary | ICD-10-CM | POA: Diagnosis not present

## 2019-10-17 DIAGNOSIS — H25811 Combined forms of age-related cataract, right eye: Secondary | ICD-10-CM | POA: Diagnosis not present

## 2019-10-17 DIAGNOSIS — H33022 Retinal detachment with multiple breaks, left eye: Secondary | ICD-10-CM | POA: Diagnosis not present

## 2019-10-23 DIAGNOSIS — H35413 Lattice degeneration of retina, bilateral: Secondary | ICD-10-CM | POA: Diagnosis not present

## 2019-10-23 DIAGNOSIS — H5211 Myopia, right eye: Secondary | ICD-10-CM | POA: Diagnosis not present

## 2019-10-23 DIAGNOSIS — H2511 Age-related nuclear cataract, right eye: Secondary | ICD-10-CM | POA: Diagnosis not present

## 2019-10-31 DIAGNOSIS — Z23 Encounter for immunization: Secondary | ICD-10-CM | POA: Diagnosis not present

## 2019-11-01 DIAGNOSIS — L821 Other seborrheic keratosis: Secondary | ICD-10-CM | POA: Diagnosis not present

## 2019-11-01 DIAGNOSIS — D485 Neoplasm of uncertain behavior of skin: Secondary | ICD-10-CM | POA: Diagnosis not present

## 2019-11-04 DIAGNOSIS — I10 Essential (primary) hypertension: Secondary | ICD-10-CM | POA: Diagnosis not present

## 2019-11-04 DIAGNOSIS — J449 Chronic obstructive pulmonary disease, unspecified: Secondary | ICD-10-CM | POA: Diagnosis not present

## 2019-11-04 DIAGNOSIS — I25119 Atherosclerotic heart disease of native coronary artery with unspecified angina pectoris: Secondary | ICD-10-CM | POA: Diagnosis not present

## 2019-11-29 DIAGNOSIS — Z23 Encounter for immunization: Secondary | ICD-10-CM | POA: Diagnosis not present

## 2019-12-20 DIAGNOSIS — H02054 Trichiasis without entropian left upper eyelid: Secondary | ICD-10-CM | POA: Diagnosis not present

## 2020-01-13 DIAGNOSIS — H25811 Combined forms of age-related cataract, right eye: Secondary | ICD-10-CM | POA: Diagnosis not present

## 2020-01-13 DIAGNOSIS — H2511 Age-related nuclear cataract, right eye: Secondary | ICD-10-CM | POA: Diagnosis not present

## 2020-02-12 DIAGNOSIS — K219 Gastro-esophageal reflux disease without esophagitis: Secondary | ICD-10-CM | POA: Diagnosis not present

## 2020-02-12 DIAGNOSIS — Z6820 Body mass index (BMI) 20.0-20.9, adult: Secondary | ICD-10-CM | POA: Diagnosis not present

## 2020-02-12 DIAGNOSIS — I10 Essential (primary) hypertension: Secondary | ICD-10-CM | POA: Diagnosis not present

## 2020-02-12 DIAGNOSIS — I2510 Atherosclerotic heart disease of native coronary artery without angina pectoris: Secondary | ICD-10-CM | POA: Diagnosis not present

## 2020-02-12 DIAGNOSIS — F17218 Nicotine dependence, cigarettes, with other nicotine-induced disorders: Secondary | ICD-10-CM | POA: Diagnosis not present

## 2020-02-12 DIAGNOSIS — M25512 Pain in left shoulder: Secondary | ICD-10-CM | POA: Diagnosis not present

## 2020-02-12 DIAGNOSIS — J449 Chronic obstructive pulmonary disease, unspecified: Secondary | ICD-10-CM | POA: Diagnosis not present

## 2020-02-12 DIAGNOSIS — L918 Other hypertrophic disorders of the skin: Secondary | ICD-10-CM | POA: Diagnosis not present

## 2020-02-12 DIAGNOSIS — Z720 Tobacco use: Secondary | ICD-10-CM | POA: Diagnosis not present

## 2020-02-12 DIAGNOSIS — H33002 Unspecified retinal detachment with retinal break, left eye: Secondary | ICD-10-CM | POA: Diagnosis not present

## 2020-02-12 DIAGNOSIS — I25119 Atherosclerotic heart disease of native coronary artery with unspecified angina pectoris: Secondary | ICD-10-CM | POA: Diagnosis not present

## 2020-02-17 DIAGNOSIS — K219 Gastro-esophageal reflux disease without esophagitis: Secondary | ICD-10-CM | POA: Diagnosis not present

## 2020-02-17 DIAGNOSIS — J449 Chronic obstructive pulmonary disease, unspecified: Secondary | ICD-10-CM | POA: Diagnosis not present

## 2020-02-17 DIAGNOSIS — H33002 Unspecified retinal detachment with retinal break, left eye: Secondary | ICD-10-CM | POA: Diagnosis not present

## 2020-02-17 DIAGNOSIS — I10 Essential (primary) hypertension: Secondary | ICD-10-CM | POA: Diagnosis not present

## 2020-02-17 DIAGNOSIS — M25512 Pain in left shoulder: Secondary | ICD-10-CM | POA: Diagnosis not present

## 2020-02-17 DIAGNOSIS — F17218 Nicotine dependence, cigarettes, with other nicotine-induced disorders: Secondary | ICD-10-CM | POA: Diagnosis not present

## 2020-02-17 DIAGNOSIS — F5221 Male erectile disorder: Secondary | ICD-10-CM | POA: Diagnosis not present

## 2020-02-17 DIAGNOSIS — Z0001 Encounter for general adult medical examination with abnormal findings: Secondary | ICD-10-CM | POA: Diagnosis not present

## 2020-02-17 DIAGNOSIS — I25119 Atherosclerotic heart disease of native coronary artery with unspecified angina pectoris: Secondary | ICD-10-CM | POA: Diagnosis not present

## 2020-07-02 DIAGNOSIS — Z23 Encounter for immunization: Secondary | ICD-10-CM | POA: Diagnosis not present

## 2020-07-17 DIAGNOSIS — Z23 Encounter for immunization: Secondary | ICD-10-CM | POA: Diagnosis not present

## 2020-08-19 DIAGNOSIS — I10 Essential (primary) hypertension: Secondary | ICD-10-CM | POA: Diagnosis not present

## 2020-08-19 DIAGNOSIS — M25512 Pain in left shoulder: Secondary | ICD-10-CM | POA: Diagnosis not present

## 2020-08-19 DIAGNOSIS — F17218 Nicotine dependence, cigarettes, with other nicotine-induced disorders: Secondary | ICD-10-CM | POA: Diagnosis not present

## 2020-08-19 DIAGNOSIS — K219 Gastro-esophageal reflux disease without esophagitis: Secondary | ICD-10-CM | POA: Diagnosis not present

## 2020-08-19 DIAGNOSIS — Z6820 Body mass index (BMI) 20.0-20.9, adult: Secondary | ICD-10-CM | POA: Diagnosis not present

## 2020-08-19 DIAGNOSIS — Z125 Encounter for screening for malignant neoplasm of prostate: Secondary | ICD-10-CM | POA: Diagnosis not present

## 2020-08-19 DIAGNOSIS — Z720 Tobacco use: Secondary | ICD-10-CM | POA: Diagnosis not present

## 2020-08-19 DIAGNOSIS — J449 Chronic obstructive pulmonary disease, unspecified: Secondary | ICD-10-CM | POA: Diagnosis not present

## 2020-08-19 DIAGNOSIS — I2510 Atherosclerotic heart disease of native coronary artery without angina pectoris: Secondary | ICD-10-CM | POA: Diagnosis not present

## 2020-08-19 DIAGNOSIS — H33002 Unspecified retinal detachment with retinal break, left eye: Secondary | ICD-10-CM | POA: Diagnosis not present

## 2020-08-19 DIAGNOSIS — I25119 Atherosclerotic heart disease of native coronary artery with unspecified angina pectoris: Secondary | ICD-10-CM | POA: Diagnosis not present

## 2020-08-24 DIAGNOSIS — I10 Essential (primary) hypertension: Secondary | ICD-10-CM | POA: Diagnosis not present

## 2020-08-24 DIAGNOSIS — M25512 Pain in left shoulder: Secondary | ICD-10-CM | POA: Diagnosis not present

## 2020-08-24 DIAGNOSIS — K219 Gastro-esophageal reflux disease without esophagitis: Secondary | ICD-10-CM | POA: Diagnosis not present

## 2020-08-24 DIAGNOSIS — H33002 Unspecified retinal detachment with retinal break, left eye: Secondary | ICD-10-CM | POA: Diagnosis not present

## 2020-08-24 DIAGNOSIS — F5221 Male erectile disorder: Secondary | ICD-10-CM | POA: Diagnosis not present

## 2020-08-24 DIAGNOSIS — F17218 Nicotine dependence, cigarettes, with other nicotine-induced disorders: Secondary | ICD-10-CM | POA: Diagnosis not present

## 2020-08-24 DIAGNOSIS — I25119 Atherosclerotic heart disease of native coronary artery with unspecified angina pectoris: Secondary | ICD-10-CM | POA: Diagnosis not present

## 2020-08-24 DIAGNOSIS — J449 Chronic obstructive pulmonary disease, unspecified: Secondary | ICD-10-CM | POA: Diagnosis not present

## 2020-10-29 DIAGNOSIS — H33022 Retinal detachment with multiple breaks, left eye: Secondary | ICD-10-CM | POA: Diagnosis not present

## 2020-10-29 DIAGNOSIS — Z961 Presence of intraocular lens: Secondary | ICD-10-CM | POA: Insufficient documentation

## 2020-10-29 DIAGNOSIS — H35411 Lattice degeneration of retina, right eye: Secondary | ICD-10-CM | POA: Insufficient documentation

## 2020-10-29 DIAGNOSIS — H2702 Aphakia, left eye: Secondary | ICD-10-CM | POA: Diagnosis not present

## 2020-11-10 ENCOUNTER — Ambulatory Visit (INDEPENDENT_AMBULATORY_CARE_PROVIDER_SITE_OTHER): Payer: Medicare Other

## 2020-11-10 ENCOUNTER — Ambulatory Visit: Payer: Medicare Other

## 2020-11-10 ENCOUNTER — Ambulatory Visit
Admission: EM | Admit: 2020-11-10 | Discharge: 2020-11-10 | Disposition: A | Discharge status: Home / Self Care | Payer: Medicare Other | Attending: Family Medicine | Admitting: Family Medicine

## 2020-11-10 ENCOUNTER — Other Ambulatory Visit: Payer: Self-pay

## 2020-11-10 ENCOUNTER — Encounter: Payer: Self-pay | Admitting: Emergency Medicine

## 2020-11-10 DIAGNOSIS — M79601 Pain in right arm: Secondary | ICD-10-CM

## 2020-11-10 DIAGNOSIS — M62838 Other muscle spasm: Secondary | ICD-10-CM | POA: Diagnosis not present

## 2020-11-10 DIAGNOSIS — M791 Myalgia, unspecified site: Secondary | ICD-10-CM

## 2020-11-10 MED ORDER — TIZANIDINE HCL 4 MG PO TABS: 4.0000 mg | ORAL_TABLET | Freq: Four times a day (QID) | ORAL | 0 refills | Status: DC | PRN

## 2020-11-10 NOTE — Discharge Instructions (Addendum)
Xray today is negative for any fractures or dislocations  I have sent in tizanidine for you to take one tablet every 6 hours as needed for muscle spasm  Follow up with orthopedics for further evaluation and treatment

## 2020-11-10 NOTE — ED Triage Notes (Signed)
States he has twisted his right arm 3 times over the past several weeks. states he feels like the muscle in his right arm is tense.  Today he noticed bruising to his upper arm

## 2020-11-10 NOTE — ED Provider Notes (Signed)
[place]   [id number] 11/10/20 Arrival Time: 1135  YC:XKGYJ PAIN  SUBJECTIVE: History from: patient. Martin Santiago is a 70 y.o. male complains of right upper arm pain that began about 3 weeks ago. Reports incidents of twisting and rotating shoulder movements that are producing pain and bruising to the right upper arm. Describes the pain as intermittent and sharp in character. Has tried OTC medications without relief. Symptoms are made worse with activity. Denies similar symptoms in the past.  Denies fever, chills, erythema, weakness, numbness and tingling, saddle paresthesias, loss of bowel or bladder function.      ROS: As per HPI.  All other pertinent ROS negative.     Past Medical History:  Diagnosis Date  . Colon polyps   . COPD (chronic obstructive pulmonary disease) ([place])   . Crohn's disease ([place])   . Detached retina   . Essential hypertension   . Tropical sprue    Past Surgical History:  Procedure Laterality Date  . BOWEL RESECTION  05/1994   [place]:  ? Crohn's  . COLONOSCOPY     Dr Derrill [facility], [facility]:  Hx polyps. Cannot remmeber date  . COLONOSCOPY N/A 12/03/2012   Procedure: COLONOSCOPY;  Surgeon: Daneil Dolin, MD;  Location: [facility];  Service: Endoscopy;  Laterality: N/A;  7:30  . INGUINAL HERNIA REPAIR Right   . RETINAL DETACHMENT SURGERY  07/2006  . RETINAL DETACHMENT SURGERY  09/2006  . RETINAL DETACHMENT SURGERY  01/2007   Allergies  Allergen Reactions  . Codeine Nausea And Vomiting and Other (See Comments)    Other reaction(s): Flushing (ALLERGY/intolerance) fainting   No current facility-administered medications on file prior to encounter.   Current Outpatient Medications on File Prior to Encounter  Medication Sig Dispense Refill  . albuterol (PROVENTIL) (2.5 MG/3ML) 0.083% nebulizer solution Take 3 mLs (2.5 mg total) by nebulization every 6 (six) hours as needed for wheezing or shortness of breath. (Patient not taking: Reported on  03/08/2019) 75 mL 12  . amLODipine (NORVASC) 10 MG tablet Take 10 mg by mouth daily. Reported on 02/25/2016    . aspirin buffered (BUFFERIN) 325 MG TABS tablet Take 650 mg by mouth daily as needed (pain).    . BEVESPI AEROSPHERE 9-4.8 MCG/ACT AERO     . budesonide-formoterol (SYMBICORT) 160-4.5 MCG/ACT inhaler Inhale 2 puffs into the lungs 2 (two) times daily. (Patient not taking: Reported on 03/08/2019) 1 Inhaler 12  . losartan (COZAAR) 100 MG tablet Take 100 mg by mouth daily. Reported on 02/25/2016    . meloxicam (MOBIC) 15 MG tablet     . omeprazole (PRILOSEC) 20 MG capsule     . tiotropium (SPIRIVA HANDIHALER) 18 MCG inhalation capsule Place 1 capsule (18 mcg total) into inhaler and inhale daily. (Patient not taking: Reported on 03/08/2019) 30 capsule 2  . VENTOLIN HFA 108 (90 Base) MCG/ACT inhaler      Social History   Socioeconomic History  . Marital status: Married    Spouse name: Not on file  . Number of children: 0  . Years of education: Not on file  . Highest education level: Not on file  Occupational History  . Occupation: retired, Personal assistant, Brewing technologist: SELF EMPLOYED  Tobacco Use  . Smoking status: Current Every Day Smoker    Packs/day: 1.50    Years: 45.00    Pack years: 67.50    Types: Cigarettes  . Smokeless tobacco: Never Used  Substance and Sexual Activity  .  Alcohol use: Yes    Alcohol/week: 0.0 standard drinks    Comment: Rare, 3-4 per yr  . Drug use: No    Comment: Remote marijuana  . Sexual activity: Not on file  Other Topics Concern  . Not on file  Social History Narrative   Lives w/ wife & step-son (43)    Social Determinants of Health   Financial Resource Strain: Not on file  Food Insecurity: Not on file  Transportation Needs: Not on file  Physical Activity: Not on file  Stress: Not on file  Social Connections: Not on file  Intimate Partner Violence: Not on file   Family History  Problem Relation Age of Onset  . Colon cancer  Maternal Aunt   . Breast cancer Mother     OBJECTIVE:  Vitals:   11/10/20 1143  BP: (!) 143/88  Pulse: 83  Resp: 18  Temp: 98 F (36.7 C)  TempSrc: Oral  SpO2: 94%    General appearance: ALERT; in no acute distress.  Head: NCAT Lungs: Normal respiratory effort CV: pulses 2+ bilaterally. Cap refill < 2 seconds Musculoskeletal:  Inspection: Skin warm, dry, clear and intact No erythema, effusion Palpation: R upper arm generally tender to palpation ROM: Limited ROM active and passive to R shoulder Skin: warm and dry Neurologic: Ambulates without difficulty; Sensation intact about the upper/ lower extremities Psychological: alert and cooperative; normal mood and affect  DIAGNOSTIC STUDIES:  DG Humerus Right  Result Date: 11/10/2020 CLINICAL DATA:  Right arm pain. EXAM: RIGHT HUMERUS - 2+ VIEW COMPARISON:  Shoulder radiographs 03/08/2019 FINDINGS: The shoulder and elbow joints are grossly maintained. No fracture or bone lesion involving the humerus. IMPRESSION: No acute bony findings. Electronically Signed   By: Marijo Sanes M.D.   On: 11/10/2020 12:11     ASSESSMENT & PLAN:  1. Muscle spasm   2. Right arm pain   3. Myalgia     Meds ordered this encounter  Medications  . tiZANidine (ZANAFLEX) 4 MG tablet    Sig: Take 1 tablet (4 mg total) by mouth every 6 (six) hours as needed for muscle spasms.    Dispense:  30 tablet    Refill:  0    Order Specific Question:   Supervising Provider    Answer:   Chase Picket [[id number]]   Xray negative today for fractures/dislocations Follow up with Dr Aline Brochure as scheduled Continue conservative management of rest, ice, and gentle stretches Take ibuprofen as needed for pain relief (may cause abdominal discomfort, ulcers, and GI bleeds avoid taking with other NSAIDs) Take tizanidine at nighttime for symptomatic relief. Avoid driving or operating heavy machinery while using medication. Follow up with PCP if symptoms  persist Return or go to the ER if you have any new or worsening symptoms (fever, chills, chest pain, abdominal pain, changes in bowel or bladder habits, pain radiating into lower legs)   Reviewed expectations re: course of current medical issues. Questions answered. Outlined signs and symptoms indicating need for more acute intervention. Patient verbalized understanding. After Visit Summary given.       Faustino Congress, NP 11/10/20 1315

## 2020-11-16 ENCOUNTER — Ambulatory Visit (INDEPENDENT_AMBULATORY_CARE_PROVIDER_SITE_OTHER): Payer: Medicare Other | Admitting: Orthopedic Surgery

## 2020-11-16 ENCOUNTER — Other Ambulatory Visit: Payer: Self-pay

## 2020-11-16 ENCOUNTER — Encounter: Payer: Self-pay | Admitting: Orthopedic Surgery

## 2020-11-16 VITALS — BP 140/84 | HR 75 | Ht 66.0 in | Wt 147.0 lb

## 2020-11-16 DIAGNOSIS — M66321 Spontaneous rupture of flexor tendons, right upper arm: Secondary | ICD-10-CM

## 2020-11-16 NOTE — Progress Notes (Signed)
Chief Complaint  Patient presents with  . Shoulder Pain    Patient reports that he was pulling the covers about a month ago and felt something pull. He reports carrying a couple of light boxes a few days ago and he has had pain since then,    70 year old male was holding some boxes and felt a weird pain in his right upper extremity and then developed bruising and swelling in the biceps and forearm.  He did go to urgent care x-rays were negative including the right humerus  Presents today for evaluation and management  Review of systems patient reports shortness of breath no hearing loss neck pain negative he does have some shortness of breath he does have some numbness from his ear to his chin he does have some easy bruising which he showed in his hand  Past Medical History:  Diagnosis Date  . Colon polyps   . COPD (chronic obstructive pulmonary disease) ([place])   . Crohn's disease ([place])   . Detached retina   . Essential hypertension   . Tropical sprue    Past Surgical History:  Procedure Laterality Date  . BOWEL RESECTION  05/1994   [place]:  ? Crohn's  . COLONOSCOPY     Dr Derrill [facility], [facility]:  Hx polyps. Cannot remmeber date  . COLONOSCOPY N/A 12/03/2012   Procedure: COLONOSCOPY;  Surgeon: Daneil Dolin, MD;  Location[facility];  Service: Endoscopy;  Laterality: N/A;  7:30  . INGUINAL HERNIA REPAIR Right   . RETINAL DETACHMENT SURGERY  07/2006  . RETINAL DETACHMENT SURGERY  09/2006  . RETINAL DETACHMENT SURGERY  01/2007   BP 140/84   Pulse 75   Ht 5\' 6"  (1.676 m)   Wt 147 lb (66.7 kg)   BMI 23.73 kg/m    Physical Exam Constitutional:      General: He is not in acute distress.    Appearance: He is well-developed.     Comments: Well developed, well nourished Normal grooming and hygiene     Cardiovascular:     Comments: No peripheral edema Musculoskeletal:     Comments: Right arm has bruising from the biceps upper edge down to the forearm the hook test for distal  biceps tendon tear is negative for tearing  He has normal range of motion of his shoulder and strength in his rotator cuff.  He has a Popeye type side with tenderness at the proximal aspect of the biceps  Skin:    General: Skin is warm and dry.  Neurological:     Mental Status: He is alert and oriented to person, place, and time.     Sensory: No sensory deficit.     Coordination: Coordination normal.     Gait: Gait normal.     Deep Tendon Reflexes: Reflexes are normal and symmetric.  Psychiatric:        Mood and Affect: Mood normal.        Behavior: Behavior normal.        Thought Content: Thought content normal.        Judgment: Judgment normal.     Comments: Affect normal     Outside film: My interpretation: [facility] lateral right humerus normal  Report was normal  Encounter Diagnosis  Name Primary?  Marland Kitchen Nontraumatic rupture of right proximal biceps tendon Yes    Patient can resume normal activities slowly as tolerated no follow-up as needed patient's tendon rupture will resolve on its own may repeat have some mild weakness  on supination

## 2020-11-16 NOTE — Patient Instructions (Signed)
Proximal Biceps Tendon Tear  The proximal biceps tendon is a strong cord of tissue that connects the biceps muscle-which is the muscle on the front of the upper arm-to the shoulder blade. A proximal biceps tendon tear (rupture) can include a partial or complete tear of the tendon near where it connects to the bone of the shoulder. This injury can interfere with your ability to lift your arm in front of your body, stabilize your shoulder, bend your elbow, and turn your hand palm-up (supination). What are the causes? This condition happens when too much force is placed on the tendon. This excess force may be caused by:  The elbow being suddenly straightened from a bent position because of an external force. This could happen, for example, while catching a heavy weight or being pulled when waterskiing.  Wear and tear from physical activity.  Breaking a fall with your hand. What increases the risk? The following factors may make you more likely to develop this condition:  Playing contact sports.  Doing activities or sports that involve throwing or overhead movements, such as racket sports, gymnastics, or baseball.  Doing activities or sports that involve putting sudden force on the arm, such as weight lifting or waterskiing.  Having a weakened tendon because of: ? Long-lasting (chronic) biceps tendinitis. ? Certain medical conditions, such as diabetes or rheumatoid arthritis. ? Repeated corticosteroid use. ? Repetitive overhead movements. What are the signs or symptoms? Symptoms of this condition may include:  Sudden sharp pain in the front of the shoulder. Pain may get worse during certain movements, such as: ? Lifting or carrying objects. ? Straightening the elbow. ? Throwing or using overhead movements.  Inflammation or a feeling of unusual warmth on the front of the shoulder.  Painful tightening (spasm) of the biceps muscle.  A bulge on the inside of the upper arm when the elbow  is bent.  Bruising in the shoulder or upper arm. This may develop 24-48 hours after the tendon is injured.  Limited range of motion of the shoulder and elbow.  Weakness in the elbow and forearm when: ? Bending the elbow. ? Rotating the wrist. How is this diagnosed? This condition may be diagnosed based on:  Your symptoms and medical history.  A physical exam. Your health care provider may test the strength and range of motion of your shoulder and elbow.  Imaging tests, such as: ? X-rays. ? MRI. ? Ultrasound. How is this treated? Treatment depends on the severity of the condition. Treatment may include:  Medicines to help relieve pain and inflammation.  Resting and icing the injured area.  Avoiding certain activities that put stress on your shoulder.  Physical therapy.  Surgery to repair the tear. This may be needed if nonsurgical treatments do not improve your condition.  One or more injections of medicines (corticosteroids) into your upper arm to help reduce inflammation. This treatment is rare. Follow these instructions at home: Managing pain, stiffness, and swelling  If directed, put ice on the injured area: ? Put ice in a plastic bag. ? Place a towel between your skin and the bag. ? Leave the ice on for 20 minutes, 2-3 times a day.  If directed, apply heat to the affected area before you exercise or as often as told by your health care provider. Use the heat source that your health care provider recommends, such as a moist heat pack or a heating pad. ? Place a towel between your skin and the heat source. ?  Leave the heat on for 20-30 minutes. ? Remove the heat if your skin turns bright red. This is especially important if you are unable to feel pain, heat, or cold. You may have a greater risk of getting burned.  Move your fingers often to avoid stiffness and to lessen swelling.  Raise (elevate) the injured area while you are sitting or lying down.       Activity  Return to your normal activities as told by your health care provider. Ask your health care provider what activities are safe for you.  Avoid activities that cause pain or make your condition worse.  Do not lift anything that is heavier than 10 lb (4.5 kg), or the limit that you are told, until your health care provider says that it is safe.  Do exercises as told by your physical therapist or health care provider. General instructions  Take over-the-counter and prescription medicines only as told by your health care provider.  Do not use any products that contain nicotine or tobacco, such as cigarettes and e-cigarettes. If you need help quitting, ask your health care provider.  Keep all follow-up visits as told by your health care provider. This is important. How is this prevented? Take these steps to help prevent reinjury:  Warm up and stretch before being active.  Cool down and stretch after being active.  Give your body time to rest between periods of activity.  Make sure you use equipment that fits you.  Be safe and responsible while being active. This will help you avoid falls.  Maintain physical fitness, including strength and flexibility. Contact a health care provider if:  You have symptoms that get worse or do not get better after 2 weeks of treatment.  You develop new symptoms. Get help right away if:  You have severe pain.  You develop pain or numbness in your hand.  Your hand feels unusually cold.  Your fingernails turn a dark color, such as blue or gray. Summary  A proximal biceps tendon tear can include a partial or complete tear of the tendon near where it connects to the bone of the shoulder.  This condition happens when too much force is placed on the tendon.  Treatment may include resting and icing the injured area.  Avoid activities that cause pain or make your condition worse. This information is not intended to replace advice given  to you by your health care provider. Make sure you discuss any questions you have with your health care provider. Document Revised: 09/03/2018 Document Reviewed: 02/05/2018 Elsevier Patient Education  [place].

## 2021-01-22 DIAGNOSIS — Z23 Encounter for immunization: Secondary | ICD-10-CM | POA: Diagnosis not present

## 2021-02-01 DIAGNOSIS — H43811 Vitreous degeneration, right eye: Secondary | ICD-10-CM | POA: Diagnosis not present

## 2021-02-01 DIAGNOSIS — Z961 Presence of intraocular lens: Secondary | ICD-10-CM | POA: Diagnosis not present

## 2021-02-11 DIAGNOSIS — I10 Essential (primary) hypertension: Secondary | ICD-10-CM | POA: Diagnosis not present

## 2021-02-12 DIAGNOSIS — H3321 Serous retinal detachment, right eye: Secondary | ICD-10-CM | POA: Diagnosis not present

## 2021-02-12 DIAGNOSIS — Z791 Long term (current) use of non-steroidal anti-inflammatories (NSAID): Secondary | ICD-10-CM | POA: Diagnosis not present

## 2021-02-12 DIAGNOSIS — Z79899 Other long term (current) drug therapy: Secondary | ICD-10-CM | POA: Diagnosis not present

## 2021-02-12 DIAGNOSIS — H33021 Retinal detachment with multiple breaks, right eye: Secondary | ICD-10-CM | POA: Diagnosis not present

## 2021-02-12 DIAGNOSIS — I10 Essential (primary) hypertension: Secondary | ICD-10-CM | POA: Diagnosis not present

## 2021-02-12 DIAGNOSIS — F17210 Nicotine dependence, cigarettes, uncomplicated: Secondary | ICD-10-CM | POA: Diagnosis not present

## 2021-02-13 DIAGNOSIS — F17210 Nicotine dependence, cigarettes, uncomplicated: Secondary | ICD-10-CM | POA: Diagnosis not present

## 2021-02-13 DIAGNOSIS — I10 Essential (primary) hypertension: Secondary | ICD-10-CM | POA: Diagnosis not present

## 2021-02-13 DIAGNOSIS — H3321 Serous retinal detachment, right eye: Secondary | ICD-10-CM | POA: Diagnosis not present

## 2021-02-13 DIAGNOSIS — Z79899 Other long term (current) drug therapy: Secondary | ICD-10-CM | POA: Diagnosis not present

## 2021-02-13 DIAGNOSIS — Z791 Long term (current) use of non-steroidal anti-inflammatories (NSAID): Secondary | ICD-10-CM | POA: Diagnosis not present

## 2021-02-16 DIAGNOSIS — Z716 Tobacco abuse counseling: Secondary | ICD-10-CM | POA: Diagnosis not present

## 2021-02-16 DIAGNOSIS — J449 Chronic obstructive pulmonary disease, unspecified: Secondary | ICD-10-CM | POA: Diagnosis not present

## 2021-02-16 DIAGNOSIS — I2510 Atherosclerotic heart disease of native coronary artery without angina pectoris: Secondary | ICD-10-CM | POA: Diagnosis not present

## 2021-02-16 DIAGNOSIS — K219 Gastro-esophageal reflux disease without esophagitis: Secondary | ICD-10-CM | POA: Diagnosis not present

## 2021-02-16 DIAGNOSIS — F17200 Nicotine dependence, unspecified, uncomplicated: Secondary | ICD-10-CM | POA: Diagnosis not present

## 2021-02-16 DIAGNOSIS — M792 Neuralgia and neuritis, unspecified: Secondary | ICD-10-CM | POA: Diagnosis not present

## 2021-02-16 DIAGNOSIS — H5442A5 Blindness left eye category 5, normal vision right eye: Secondary | ICD-10-CM | POA: Diagnosis not present

## 2021-02-16 DIAGNOSIS — L720 Epidermal cyst: Secondary | ICD-10-CM | POA: Diagnosis not present

## 2021-02-16 DIAGNOSIS — I10 Essential (primary) hypertension: Secondary | ICD-10-CM | POA: Diagnosis not present

## 2021-02-16 DIAGNOSIS — H3321 Serous retinal detachment, right eye: Secondary | ICD-10-CM | POA: Diagnosis not present

## 2021-02-16 DIAGNOSIS — D509 Iron deficiency anemia, unspecified: Secondary | ICD-10-CM | POA: Diagnosis not present

## 2021-02-19 DIAGNOSIS — H3321 Serous retinal detachment, right eye: Secondary | ICD-10-CM | POA: Diagnosis not present

## 2021-03-03 ENCOUNTER — Encounter: Payer: Self-pay | Admitting: *Deleted

## 2021-04-06 ENCOUNTER — Ambulatory Visit: Payer: Medicare Other | Admitting: Internal Medicine

## 2021-06-10 DIAGNOSIS — Z961 Presence of intraocular lens: Secondary | ICD-10-CM | POA: Diagnosis not present

## 2021-06-10 DIAGNOSIS — H2702 Aphakia, left eye: Secondary | ICD-10-CM | POA: Diagnosis not present

## 2021-06-10 DIAGNOSIS — H3321 Serous retinal detachment, right eye: Secondary | ICD-10-CM | POA: Diagnosis not present

## 2021-06-25 ENCOUNTER — Other Ambulatory Visit: Payer: Self-pay

## 2021-06-25 ENCOUNTER — Encounter: Payer: Self-pay | Admitting: Pulmonary Disease

## 2021-06-25 ENCOUNTER — Ambulatory Visit (INDEPENDENT_AMBULATORY_CARE_PROVIDER_SITE_OTHER): Payer: Medicare Other | Admitting: Pulmonary Disease

## 2021-06-25 VITALS — BP 118/70 | HR 75 | Ht 66.0 in | Wt 130.4 lb

## 2021-06-25 DIAGNOSIS — Z23 Encounter for immunization: Secondary | ICD-10-CM

## 2021-06-25 DIAGNOSIS — J432 Centrilobular emphysema: Secondary | ICD-10-CM | POA: Diagnosis not present

## 2021-06-25 MED ORDER — BREZTRI AEROSPHERE 160-9-4.8 MCG/ACT IN AERO: 2.0000 | INHALATION_SPRAY | Freq: Two times a day (BID) | RESPIRATORY_TRACT | 0 refills | Status: DC

## 2021-06-25 MED ORDER — BREZTRI AEROSPHERE 160-9-4.8 MCG/ACT IN AERO: 2.0000 | INHALATION_SPRAY | Freq: Two times a day (BID) | RESPIRATORY_TRACT | 6 refills | Status: DC

## 2021-06-25 NOTE — Patient Instructions (Signed)
Start Breztri Inhaler 2 puffs twice daily - Rinse mouth after each use  Stop Bevespi Inhaler once starting the [employer]  Please check with your eye doctor before starting Martin Santiago if there is any concern for glaucoma as the inhaled steroid component of Breztri can worsen glaucoma  We will order you a nebulizer machine and Albuterol nebulizer solution to use twice daily followed by flutter valve therapy  We will schedule you for an overnight oximetry test to check you oxygen levels at night

## 2021-06-25 NOTE — Progress Notes (Signed)
Synopsis: Referred in September 2022 for COPD  Subjective:   PATIENT ID: Martin Santiago GENDER: male DOB: 09-May-1951, MRN: [id number]   HPI  Chief Complaint  Patient presents with   Consult    Self referral for COPD. States he has not been officially diagnosed with COPD. Increased wheezing and coughing over the past few months    Martin Santiago is a 70 year old male, daily smoker with hypertension and emphysema who is referred to pulmonary clinic for evaluation of COPD.   He has had increasing shortness of breath, cough and wheezing over recent months.  He has been on Bevespi inhaler over the last few years with good effect but he has been having more frequent breakthrough shortness of breath and wheezing recently.  He has an as needed albuterol inhaler which he uses but does not find much relief with this inhaler.  He does not have a nebulizer machine at home.  His cough is productive with sputum production on a daily basis.  He continues to smoke daily and has smoked over the past 45 years.  He has a 60+ pack year smoking history.  High-resolution CT scan from 2017 shows extensive centrilobular emphysematous changes.  He has been seeing an ophthalmologist for retinal detachment but does not recall being treated for glaucoma.  Under his problem list there is listed secondary open-angle glaucoma of the left eye.  Past Medical History:  Diagnosis Date   Colon polyps    COPD (chronic obstructive pulmonary disease) (HCC)    Crohn's disease ([place])    Detached retina    Essential hypertension    Tropical sprue      Family History  Problem Relation Age of Onset   Colon cancer Maternal Aunt    Breast cancer Mother      Social History   Socioeconomic History   Marital status: Married    Spouse name: Not on file   Number of children: 0   Years of education: Not on file   Highest education level: Not on file  Occupational History   Occupation: retired, Personal assistant, church organist     Employer: SELF EMPLOYED  Tobacco Use   Smoking status: Every Day    Packs/day: 1.50    Years: 45.00    Pack years: 67.50    Types: Cigarettes   Smokeless tobacco: Never  Substance and Sexual Activity   Alcohol use: Yes    Alcohol/week: 0.0 standard drinks    Comment: Rare, 3-4 per yr   Drug use: No    Comment: Remote marijuana   Sexual activity: Not on file  Other Topics Concern   Not on file  Social History Narrative   Lives w/ wife & step-son (24)    Social Determinants of Health   Financial Resource Strain: Not on file  Food Insecurity: Not on file  Transportation Needs: Not on file  Physical Activity: Not on file  Stress: Not on file  Social Connections: Not on file  Intimate Partner Violence: Not on file     Allergies  Allergen Reactions   Codeine Nausea And Vomiting and Other (See Comments)    Other reaction(s): Flushing (ALLERGY/intolerance) fainting     Outpatient Medications Prior to Visit  Medication Sig Dispense Refill   amLODipine (NORVASC) 10 MG tablet Take 10 mg by mouth daily. Reported on 02/25/2016     losartan (COZAAR) 100 MG tablet Take 100 mg by mouth daily. Reported on 02/25/2016     meloxicam (MOBIC)  15 MG tablet      omeprazole (PRILOSEC) 20 MG capsule      timolol (TIMOPTIC) 0.5 % ophthalmic solution Place 1 drop into the right eye 2 times daily.     VENTOLIN HFA 108 (90 Base) MCG/ACT inhaler      BEVESPI AEROSPHERE 9-4.8 MCG/ACT AERO      aspirin buffered (BUFFERIN) 325 MG TABS tablet Take 650 mg by mouth daily as needed (pain).     tiZANidine (ZANAFLEX) 4 MG tablet Take 1 tablet (4 mg total) by mouth every 6 (six) hours as needed for muscle spasms. 30 tablet 0   No facility-administered medications prior to visit.    Review of Systems  Constitutional:  Negative for chills, fever, malaise/fatigue and weight loss.  HENT:  Negative for congestion, sinus pain and sore throat.   Eyes: Negative.   Respiratory:  Positive for cough, sputum  production, shortness of breath and wheezing. Negative for hemoptysis.   Cardiovascular:  Negative for chest pain, palpitations, orthopnea, claudication and leg swelling.  Gastrointestinal:  Negative for abdominal pain, heartburn, nausea and vomiting.  Genitourinary: Negative.   Musculoskeletal:  Negative for joint pain and myalgias.  Skin:  Negative for rash.  Neurological:  Negative for weakness.  Endo/Heme/Allergies: Negative.   Psychiatric/Behavioral: Negative.       Objective:   Vitals:   06/25/21 1040  BP: 118/70  Pulse: 75  SpO2: 97%  Weight: 130 lb 6.4 oz (59.1 kg)  Height: 5\' 6"  (1.676 m)     Physical Exam Constitutional:      General: He is not in acute distress. HENT:     Head: Normocephalic and atraumatic.  Eyes:     Extraocular Movements: Extraocular movements intact.     Conjunctiva/sclera: Conjunctivae normal.     Pupils: Pupils are equal, round, and reactive to light.  Cardiovascular:     Rate and Rhythm: Normal rate and regular rhythm.     Pulses: Normal pulses.     Heart sounds: Normal heart sounds. No murmur heard. Pulmonary:     Effort: Pulmonary effort is normal.     Breath sounds: Decreased air movement present. No wheezing, rhonchi or rales.  Abdominal:     General: Bowel sounds are normal.     Palpations: Abdomen is soft.  Musculoskeletal:     Right lower leg: No edema.     Left lower leg: No edema.  Lymphadenopathy:     Cervical: No cervical adenopathy.  Skin:    General: Skin is warm and dry.  Neurological:     General: No focal deficit present.     Mental Status: He is alert.  Psychiatric:        Mood and Affect: Mood normal.        Behavior: Behavior normal.        Thought Content: Thought content normal.        Judgment: Judgment normal.   CBC    Component Value Date/Time   WBC 17.3 (H) 06/14/2017 0705   RBC 4.88 06/14/2017 0705   HGB 15.2 06/14/2017 0705   HCT 45.4 06/14/2017 0705   PLT 222 06/14/2017 0705   MCV 93.0  06/14/2017 0705   MCH 31.1 06/14/2017 0705   MCHC 33.5 06/14/2017 0705   RDW 14.4 06/14/2017 0705   LYMPHSABS 1.1 06/13/2017 1159   MONOABS 1.0 06/13/2017 1159   EOSABS 0.2 06/13/2017 1159   BASOSABS 0.0 06/13/2017 1159   Chest imaging: HRCT Chest 2017 1. Moderate to severe  centrilobular emphysema. No evidence of interstitial lung disease. 2. Basilar subpleural nodules are likely subpleural lymph nodes.  PFT: No flowsheet data found.  Assessment & Plan:   Centrilobular emphysema ([place]) - Plan: Pulse oximetry, overnight, Budeson-Glycopyrrol-Formoterol (BREZTRI AEROSPHERE) 160-9-4.8 MCG/ACT AERO, Ambulatory Referral for DME  Need for immunization against influenza - Plan: Flu Vaccine QUAD High Dose(Fluad)  Discussion: Martin Santiago is a 70 year old male, daily smoker with hypertension and emphysema who is referred to pulmonary clinic for evaluation of COPD.   He has significant emphysematous changes of his lungs based on high-resolution CT scan from 2017.  I reviewed these findings in detail with the patient and his daughter as I showed them the images.  We also discussed that his ongoing smoking continues to add to further lung damage and emphysematous changes.  He is not interested in quitting smoking at this time.  We have provided him with a sample of Breztri inhaler that he can try if approved by his ophthalmologist.  There is concern for glaucoma based on his problem list and I informed him and his daughter that inhaler corticosteroids can lead to worsening of glaucoma.  If he is unable to switch to [employer] inhaler he can continue on his Bevespi inhaler.  We will order him a nebulizer machine and albuterol nebulizer solution to be used twice daily with flutter valve therapy for bronchial hygiene.  He is up-to-date with his pneumococcal vaccine.  We will provide him with a influenza vaccine today.  He did not drop below 88% SPO2 on simple walk in clinic today.  He was only able to  complete 1 lap.  We will check nocturnal oximetry.  Follow-up in 3 months.  Freda Jackson, MD [facility] Office: [phone]   Current Outpatient Medications:    amLODipine (NORVASC) 10 MG tablet, Take 10 mg by mouth daily. Reported on 02/25/2016, Disp: , Rfl:    Budeson-Glycopyrrol-Formoterol (BREZTRI AEROSPHERE) 160-9-4.8 MCG/ACT AERO, Inhale 2 puffs into the lungs in the morning and at bedtime., Disp: 10.7 g, Rfl: 6   Budeson-Glycopyrrol-Formoterol (BREZTRI AEROSPHERE) 160-9-4.8 MCG/ACT AERO, Inhale 2 puffs into the lungs in the morning and at bedtime., Disp: 5.9 g, Rfl: 0   losartan (COZAAR) 100 MG tablet, Take 100 mg by mouth daily. Reported on 02/25/2016, Disp: , Rfl:    meloxicam (MOBIC) 15 MG tablet, , Disp: , Rfl:    omeprazole (PRILOSEC) 20 MG capsule, , Disp: , Rfl:    timolol (TIMOPTIC) 0.5 % ophthalmic solution, Place 1 drop into the right eye 2 times daily., Disp: , Rfl:    VENTOLIN HFA 108 (90 Base) MCG/ACT inhaler, , Disp: , Rfl:

## 2021-06-29 NOTE — Addendum Note (Signed)
Addended by: Freda Jackson on: 06/29/2021 05:04 PM   Modules accepted: Orders

## 2021-07-06 ENCOUNTER — Encounter: Payer: Self-pay | Admitting: Pulmonary Disease

## 2021-07-06 DIAGNOSIS — R0683 Snoring: Secondary | ICD-10-CM | POA: Diagnosis not present

## 2021-07-06 DIAGNOSIS — G4730 Sleep apnea, unspecified: Secondary | ICD-10-CM | POA: Diagnosis not present

## 2021-07-09 ENCOUNTER — Telehealth: Payer: Self-pay | Admitting: Pulmonary Disease

## 2021-07-09 NOTE — Telephone Encounter (Signed)
Tried calling Melissa and did not get an answer- LMTCB.

## 2021-07-12 ENCOUNTER — Telehealth: Payer: Self-pay | Admitting: Pulmonary Disease

## 2021-07-12 MED ORDER — ALBUTEROL SULFATE (2.5 MG/3ML) 0.083% IN NEBU: 2.5000 mg | INHALATION_SOLUTION | Freq: Two times a day (BID) | RESPIRATORY_TRACT | 11 refills | Status: DC

## 2021-07-12 NOTE — Telephone Encounter (Signed)
I have called the pt and he stated that someone called him on 10/06 and told him that his meds were being shipped out and he still has not received these and he didn't know where they were coming from.    I called the pharmacy and they stated that they are needing rx faxed to them for the albuterol neb meds.  This has been done.    They are going to reach out to the pt to make him aware of med that is being mailed and how he will receive this.  Nothing further is needed.

## 2021-07-12 NOTE — Telephone Encounter (Signed)
Called and spoke with pt and he stated that he has still not received his nebulizer meds.  He stated that they did call him on 10/06 and said that they were shipping this out.  He still has not received this.    I called the DME pharmacy and they stated that they were waiting on the order to be sent over.  I have done this.  She stated that she will call the pt to make him aware of order and how it will be shipped.  Nothing further is needed.

## 2021-07-13 ENCOUNTER — Ambulatory Visit: Payer: Medicare Other | Attending: Internal Medicine

## 2021-07-13 DIAGNOSIS — Z23 Encounter for immunization: Secondary | ICD-10-CM

## 2021-07-13 NOTE — Progress Notes (Signed)
   Covid-19 Vaccination Clinic  Name:  Martin Santiago    MRN: [id number] DOB: [birth year]  07/13/2021  Mr. Kynard was observed post Covid-19 immunization for 15 minutes without incident. He was provided with Vaccine Information Sheet and instruction to access the V-Safe system.   Mr. Rhine was instructed to call 911 with any severe reactions post vaccine: Difficulty breathing  Swelling of face and throat  A fast heartbeat  A bad rash all over body  Dizziness and weakness

## 2021-07-21 ENCOUNTER — Telehealth: Payer: Self-pay | Admitting: Pulmonary Disease

## 2021-07-21 NOTE — Telephone Encounter (Signed)
Please let patient know that his overnight oximitry showed he spent 7hrs and 17 minute with an SpO2 of less than 88% so he does qualify for supplemental oxygen at night. I would recommend humidified oxygen at 2-3L when sleeping. Please send orders to DME company if he is amenable to starting supplemental oxygen at night when sleeping.  Thanks, Wille Glaser

## 2021-07-22 NOTE — Telephone Encounter (Signed)
I called the patient and he does not want the oxygen at night time for now and he feels that the Verl Blalock  is helping for now and he wants to think about it and let the provider know at the next follow up.    I have called Melissa and let her know the patient does not want to have the Oxygen at this time. Nothing further needed.

## 2021-08-04 DIAGNOSIS — Z716 Tobacco abuse counseling: Secondary | ICD-10-CM | POA: Diagnosis not present

## 2021-08-04 DIAGNOSIS — F17210 Nicotine dependence, cigarettes, uncomplicated: Secondary | ICD-10-CM | POA: Diagnosis not present

## 2021-08-04 DIAGNOSIS — H44751 Retained (nonmagnetic) (old) foreign body in vitreous body, right eye: Secondary | ICD-10-CM | POA: Diagnosis not present

## 2021-08-04 DIAGNOSIS — K219 Gastro-esophageal reflux disease without esophagitis: Secondary | ICD-10-CM | POA: Diagnosis not present

## 2021-08-04 DIAGNOSIS — R062 Wheezing: Secondary | ICD-10-CM | POA: Diagnosis not present

## 2021-08-04 DIAGNOSIS — H3321 Serous retinal detachment, right eye: Secondary | ICD-10-CM | POA: Diagnosis not present

## 2021-08-04 DIAGNOSIS — I10 Essential (primary) hypertension: Secondary | ICD-10-CM | POA: Diagnosis not present

## 2021-08-05 DIAGNOSIS — Z9889 Other specified postprocedural states: Secondary | ICD-10-CM | POA: Diagnosis not present

## 2021-08-05 DIAGNOSIS — Z48810 Encounter for surgical aftercare following surgery on the sense organs: Secondary | ICD-10-CM | POA: Diagnosis not present

## 2021-08-05 DIAGNOSIS — H2702 Aphakia, left eye: Secondary | ICD-10-CM | POA: Diagnosis not present

## 2021-08-05 DIAGNOSIS — Z8669 Personal history of other diseases of the nervous system and sense organs: Secondary | ICD-10-CM | POA: Diagnosis not present

## 2021-08-05 DIAGNOSIS — H3321 Serous retinal detachment, right eye: Secondary | ICD-10-CM | POA: Diagnosis not present

## 2021-08-10 ENCOUNTER — Other Ambulatory Visit (HOSPITAL_BASED_OUTPATIENT_CLINIC_OR_DEPARTMENT_OTHER): Payer: Self-pay

## 2021-08-10 DIAGNOSIS — Z23 Encounter for immunization: Secondary | ICD-10-CM | POA: Diagnosis not present

## 2021-08-10 MED ORDER — MODERNA COVID-19 BIVAL BOOSTER 50 MCG/0.5ML IM SUSP
INTRAMUSCULAR | 0 refills | Status: DC
  Filled 2021-08-10: qty 0.5, 1d supply, fill #0

## 2021-08-18 ENCOUNTER — Other Ambulatory Visit: Payer: Self-pay

## 2021-08-18 ENCOUNTER — Ambulatory Visit (INDEPENDENT_AMBULATORY_CARE_PROVIDER_SITE_OTHER): Payer: Medicare Other | Admitting: Medical

## 2021-08-18 ENCOUNTER — Encounter: Payer: Self-pay | Admitting: Medical

## 2021-08-18 VITALS — BP 140/70 | HR 83 | Temp 97.8°F | Resp 18 | Ht 66.0 in | Wt 128.0 lb

## 2021-08-18 DIAGNOSIS — I10 Essential (primary) hypertension: Secondary | ICD-10-CM

## 2021-08-18 DIAGNOSIS — E611 Iron deficiency: Secondary | ICD-10-CM

## 2021-08-18 DIAGNOSIS — E538 Deficiency of other specified B group vitamins: Secondary | ICD-10-CM

## 2021-08-18 DIAGNOSIS — K219 Gastro-esophageal reflux disease without esophagitis: Secondary | ICD-10-CM | POA: Diagnosis not present

## 2021-08-18 DIAGNOSIS — J449 Chronic obstructive pulmonary disease, unspecified: Secondary | ICD-10-CM

## 2021-08-18 DIAGNOSIS — D649 Anemia, unspecified: Secondary | ICD-10-CM

## 2021-08-18 DIAGNOSIS — K921 Melena: Secondary | ICD-10-CM

## 2021-08-18 NOTE — Patient Instructions (Addendum)
Htn- on recheck bp better/ border lin controlled but you do report some white coat htn. Continue losartan and amlodipine. Get cmp and lipid panel.  Jerrye Bushy- well controlled with omeprazole.  Copd- continue inhalers and prescribed by pulmonologist.   Some stress and depressed moved about wife condition. If mood worsens let us know.  Anemia- get cbc and iron level.  B12 deficiency. Get b12 level with other labs/  Make sure labs are future fasting.  Follow up in 1 month or sooner if needed.

## 2021-08-18 NOTE — Progress Notes (Signed)
Subjective:    Patient ID: Martin Santiago, male    DOB: [birth year], 70 y.o.   MRN: [id number]  HPI  Pt in for first time.   Pt retired. 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 Pt is organist.  Pt former pcp was Dr. Nevada Crane. No living in [place]. He needs new pcp.  Pt has [facility].  Hx of retinal detachments.  Copd- seed Dr. Erin Fulling. Pt is on brestri inhaler and albuterol if needed.   Htn- bp 142/88 initially. On losartan 100 mg daily and amlodipine 10 mg daily.  Gerd- on omeprazole 20 mg daily.   Pt smokes about 6-7 cigarettes a day.  Pt had flu and covid vaccine within last month.  Pt daughter was not happy with his prior pcp.   Wife recently diagnosed with glioblastoma. Pt primary care giver of wife. At times depressed and frustrated. Get break from care when daughter come from [place].   Review of Systems  Constitutional:  Negative for chills, fatigue and fever.  HENT:  Negative for congestion, drooling, ear pain, hearing loss and mouth sores.   Respiratory:  Negative for cough, chest tightness, shortness of breath and wheezing.   Cardiovascular:  Negative for chest pain and palpitations.  Gastrointestinal:  Negative for abdominal pain, blood in stool, constipation and diarrhea.  Genitourinary:  Negative for dysuria, flank pain, frequency, genital sores and penile pain.  Musculoskeletal:  Negative for back pain, joint swelling, myalgias and neck pain.  Neurological:  Negative for dizziness, syncope, weakness, numbness and headaches.  Hematological:  Negative for adenopathy. Does not bruise/bleed easily.  Psychiatric/Behavioral:  Negative for behavioral problems, decreased concentration and dysphoric mood.     Past Medical History:  Diagnosis Date   Colon polyps    COPD (chronic obstructive pulmonary disease) (HCC)    Crohn's disease ([place])    Detached retina    Essential hypertension     Tropical sprue      Social History   Socioeconomic History   Marital status: Married    Spouse name: Not on file   Number of children: 0   Years of education: Not on file   Highest education level: Not on file  Occupational History   Occupation: retired, Personal assistant, church organist    Employer: SELF EMPLOYED  Tobacco Use   Smoking status: Every Day    Packs/day: 1.50    Years: 45.00    Pack years: 67.50    Types: Cigarettes   Smokeless tobacco: Never  Substance and Sexual Activity   Alcohol use: Yes    Alcohol/week: 0.0 standard drinks    Comment: Rare, 3-4 per yr   Drug use: No    Comment: Remote marijuana   Sexual activity: Not on file  Other Topics Concern   Not on file  Social History Narrative   Lives w/ wife & step-son (61)    Social Determinants of Health   Financial Resource Strain: Not on file  Food Insecurity: Not on file  Transportation Needs: Not on file  Physical Activity: Not on file  Stress: Not on file  Social Connections: Not on file  Intimate Partner Violence: Not on file    Past Surgical History:  Procedure Laterality Date   BOWEL RESECTION  05/1994   M[facility] Cella:  ? Crohn's   COLONOSCOPY     Dr Derrill Center, [facility]:  Hx polyps. Cannot remmeber date   COLONOSCOPY N/A 12/03/2012  Procedure: COLONOSCOPY;  Surgeon: Daneil Dolin, MD;  Location: [facility];  Service: Endoscopy;  Laterality: N/A;  7:30   INGUINAL HERNIA REPAIR Right    RETINAL DETACHMENT SURGERY  07/2006   RETINAL DETACHMENT SURGERY  09/2006   RETINAL DETACHMENT SURGERY  01/2007    Family History  Problem Relation Age of Onset   Colon cancer Maternal Aunt    Breast cancer Mother     Allergies  Allergen Reactions   Codeine Nausea And Vomiting and Other (See Comments)    Other reaction(s): Flushing (ALLERGY/intolerance) fainting    Current Outpatient Medications on File Prior to Visit  Medication Sig Dispense Refill   albuterol (PROVENTIL) (2.5 MG/3ML) 0.083% nebulizer  solution Take 3 mLs (2.5 mg total) by nebulization in the morning and at bedtime. 180 mL 11   amLODipine (NORVASC) 10 MG tablet Take 10 mg by mouth daily. Reported on 02/25/2016     Budeson-Glycopyrrol-Formoterol (BREZTRI AEROSPHERE) 160-9-4.8 MCG/ACT AERO Inhale 2 puffs into the lungs in the morning and at bedtime. 10.7 g 6   Budeson-Glycopyrrol-Formoterol (BREZTRI AEROSPHERE) 160-9-4.8 MCG/ACT AERO Inhale 2 puffs into the lungs in the morning and at bedtime. 5.9 g 0   COVID-19 mRNA bivalent vaccine, Moderna, (MODERNA COVID-19 BIVAL BOOSTER) 50 MCG/0.5ML injection Inject into the muscle. 0.5 mL 0   losartan (COZAAR) 100 MG tablet Take 100 mg by mouth daily. Reported on 02/25/2016     meloxicam (MOBIC) 15 MG tablet      omeprazole (PRILOSEC) 20 MG capsule      timolol (TIMOPTIC) 0.5 % ophthalmic solution Place 1 drop into the right eye 2 times daily.     VENTOLIN HFA 108 (90 Base) MCG/ACT inhaler      No current facility-administered medications on file prior to visit.    BP (!) 142/88   Pulse 83   Temp 97.8 F (36.6 C)   Resp 18   Ht 5\' 6"  (1.676 m)   Wt 128 lb (58.1 kg)   SpO2 94%   BMI 20.66 kg/m       Objective:   Physical Exam  General Mental Status- Alert. General Appearance- Not in acute distress.   Skin General: Color- Normal Color. Moisture- Normal Moisture.  Neck Carotid Arteries- Normal color. Moisture- Normal Moisture. No carotid bruits. No JVD.  Chest and Lung Exam Auscultation: Breath Sounds:-Normal.  Cardiovascular Auscultation:Rythm- Regular. Murmurs & Other Heart Sounds:Auscultation of the heart reveals- No Murmurs.  Abdomen Inspection:-Inspeection Normal. Palpation/Percussion:Note:No mass. Palpation and Percussion of the abdomen reveal- Non Tender, Non Distended + BS, no rebound or guarding.  Neurologic Cranial Nerve exam:- CN III-XII intact(No nystagmus), symmetric smile. Strength:- 5/5 equal and symmetric strength both upper and lower extremities.        Assessment & Plan:   Patient Instructions  Htn- on recheck bp better/ border lin controlled but you do report some white coat htn. Continue losartan and amlodipine. Get cmp and lipid panel.  Jerrye Bushy- well controlled with omeprazole.  Copd- continue inhalers and prescribed by pulmonologist.   Some stress and depressed moved about wife condition. If mood worsens let us know.  Anemia- get cbc and iron level.  B12 deficiency. Get b12 level with other labs/  Make sure labs are future fasting.  Follow up in 1 month or sooner if needed.    Mackie Pai, PA-C

## 2021-08-23 DIAGNOSIS — Z961 Presence of intraocular lens: Secondary | ICD-10-CM | POA: Diagnosis not present

## 2021-08-25 ENCOUNTER — Other Ambulatory Visit (INDEPENDENT_AMBULATORY_CARE_PROVIDER_SITE_OTHER): Payer: Medicare Other

## 2021-08-25 ENCOUNTER — Other Ambulatory Visit: Payer: Self-pay

## 2021-08-25 DIAGNOSIS — E538 Deficiency of other specified B group vitamins: Secondary | ICD-10-CM

## 2021-08-25 DIAGNOSIS — I10 Essential (primary) hypertension: Secondary | ICD-10-CM

## 2021-08-25 DIAGNOSIS — D649 Anemia, unspecified: Secondary | ICD-10-CM

## 2021-08-25 LAB — COMPREHENSIVE METABOLIC PANEL
ALT: 5 U/L (ref 0–53)
AST: 9 U/L (ref 0–37)
Albumin: 3.9 g/dL (ref 3.5–5.2)
Alkaline Phosphatase: 55 U/L (ref 39–117)
BUN: 25 mg/dL — ABNORMAL HIGH (ref 6–23)
CO2: 29 mEq/L (ref 19–32)
Calcium: 10.4 mg/dL (ref 8.4–10.5)
Chloride: 106 mEq/L (ref 96–112)
Creatinine, Ser: 1.31 mg/dL (ref 0.40–1.50)
GFR: 54.58 mL/min — ABNORMAL LOW (ref >60.00)
Glucose, Bld: 88 mg/dL (ref 70–99)
Potassium: 4.3 mEq/L (ref 3.5–5.1)
Sodium: 140 mEq/L (ref 135–145)
Total Bilirubin: 0.5 mg/dL (ref 0.2–1.2)
Total Protein: 6.4 g/dL (ref 6.0–8.3)

## 2021-08-25 LAB — CBC WITH DIFFERENTIAL/PLATELET
Basophils Absolute: 0.1 10*3/uL (ref 0.0–0.1)
Basophils Relative: 1.2 % (ref 0.0–3.0)
Eosinophils Absolute: 0.3 10*3/uL (ref 0.0–0.7)
Eosinophils Relative: 4.1 % (ref 0.0–5.0)
HCT: 30.1 % — ABNORMAL LOW (ref 39.0–52.0)
Hemoglobin: 9 g/dL — ABNORMAL LOW (ref 13.0–17.0)
Lymphocytes Relative: 20.1 % (ref 12.0–46.0)
Lymphs Abs: 1.3 10*3/uL (ref 0.7–4.0)
MCHC: 29.8 g/dL — ABNORMAL LOW (ref 30.0–36.0)
MCV: 64.1 fl — ABNORMAL LOW (ref 78.0–100.0)
Monocytes Absolute: 0.8 10*3/uL (ref 0.1–1.0)
Monocytes Relative: 13.1 % — ABNORMAL HIGH (ref 3.0–12.0)
Neutro Abs: 3.9 10*3/uL (ref 1.4–7.7)
Neutrophils Relative %: 61.5 % (ref 43.0–77.0)
Platelets: 309 10*3/uL (ref 150.0–400.0)
RBC: 4.69 Mil/uL (ref 4.22–5.81)
RDW: 20.1 % — ABNORMAL HIGH (ref 11.5–15.5)
WBC: 6.3 10*3/uL (ref 4.0–10.5)

## 2021-08-25 LAB — LIPID PANEL
Cholesterol: 153 mg/dL (ref 0–200)
HDL: 53.2 mg/dL (ref >39.00)
LDL Cholesterol: 85 mg/dL (ref 0–99)
NonHDL: 99.43
Total CHOL/HDL Ratio: 3
Triglycerides: 70 mg/dL (ref 0.0–149.0)
VLDL: 14 mg/dL (ref 0.0–40.0)

## 2021-08-25 LAB — VITAMIN B12: Vitamin B-12: 144 pg/mL — ABNORMAL LOW (ref 211–911)

## 2021-08-25 LAB — IRON: Iron: 16 ug/dL — ABNORMAL LOW (ref 42–165)

## 2021-08-26 MED ORDER — IRON (FERROUS SULFATE) 325 (65 FE) MG PO TABS: ORAL_TABLET | ORAL | 1 refills | Status: DC

## 2021-08-26 NOTE — Addendum Note (Signed)
Addended by: Anabel Halon on: 08/26/2021 07:12 PM   Modules accepted: Orders

## 2021-08-30 ENCOUNTER — Telehealth: Payer: Self-pay | Admitting: Medical

## 2021-08-30 DIAGNOSIS — D649 Anemia, unspecified: Secondary | ICD-10-CM

## 2021-08-30 NOTE — Telephone Encounter (Signed)
Pt stated he was due for labs and was wondering if he could get them done tomorrow with his b-12 injection. Did not see he has any active requests and he was unsure if he is getting those labs done tomorrow and if he would need to fast. He stated he would like to get everything handled tomorrow since he has to get transportation please advise.

## 2021-08-31 ENCOUNTER — Other Ambulatory Visit (INDEPENDENT_AMBULATORY_CARE_PROVIDER_SITE_OTHER): Payer: Medicare Other

## 2021-08-31 ENCOUNTER — Ambulatory Visit (INDEPENDENT_AMBULATORY_CARE_PROVIDER_SITE_OTHER): Payer: Medicare Other

## 2021-08-31 DIAGNOSIS — E538 Deficiency of other specified B group vitamins: Secondary | ICD-10-CM

## 2021-08-31 DIAGNOSIS — D649 Anemia, unspecified: Secondary | ICD-10-CM | POA: Diagnosis not present

## 2021-08-31 MED ORDER — CYANOCOBALAMIN 1000 MCG/ML IJ SOLN
1000.0000 ug | Freq: Once | INTRAMUSCULAR | Status: AC
Start: 2021-08-31 — End: 2021-08-31
  Administered 2021-08-31: 1000 ug via INTRAMUSCULAR

## 2021-08-31 NOTE — Progress Notes (Addendum)
Pt here today for B12 injection. (1/4)  Cyanocobalamin 1097mL injected into L deltoid. Pt tolerated injection well.   Next nurse visit scheduled for 09/10/21.  Mackie Pai, PA-C

## 2021-08-31 NOTE — Telephone Encounter (Signed)
Lmom on machine appt has been made.  He can go after nv today 215pm

## 2021-09-01 LAB — CBC
HCT: 30.3 % — ABNORMAL LOW (ref 39.0–52.0)
Hemoglobin: 8.9 g/dL — ABNORMAL LOW (ref 13.0–17.0)
MCHC: 29.2 g/dL — ABNORMAL LOW (ref 30.0–36.0)
MCV: 65.4 fl — ABNORMAL LOW (ref 78.0–100.0)
Platelets: 325 10*3/uL (ref 150.0–400.0)
RBC: 4.63 Mil/uL (ref 4.22–5.81)
RDW: 19.6 % — ABNORMAL HIGH (ref 11.5–15.5)
WBC: 6.5 10*3/uL (ref 4.0–10.5)

## 2021-09-08 DIAGNOSIS — H3589 Other specified retinal disorders: Secondary | ICD-10-CM | POA: Diagnosis not present

## 2021-09-08 DIAGNOSIS — H2702 Aphakia, left eye: Secondary | ICD-10-CM | POA: Diagnosis not present

## 2021-09-08 DIAGNOSIS — H3321 Serous retinal detachment, right eye: Secondary | ICD-10-CM | POA: Diagnosis not present

## 2021-09-08 DIAGNOSIS — Z8669 Personal history of other diseases of the nervous system and sense organs: Secondary | ICD-10-CM | POA: Diagnosis not present

## 2021-09-08 DIAGNOSIS — Z48810 Encounter for surgical aftercare following surgery on the sense organs: Secondary | ICD-10-CM | POA: Diagnosis not present

## 2021-09-10 ENCOUNTER — Other Ambulatory Visit: Payer: Self-pay | Admitting: *Deleted

## 2021-09-10 ENCOUNTER — Ambulatory Visit (INDEPENDENT_AMBULATORY_CARE_PROVIDER_SITE_OTHER): Payer: Medicare Other | Admitting: *Deleted

## 2021-09-10 DIAGNOSIS — E538 Deficiency of other specified B group vitamins: Secondary | ICD-10-CM

## 2021-09-10 DIAGNOSIS — D649 Anemia, unspecified: Secondary | ICD-10-CM

## 2021-09-10 MED ORDER — CYANOCOBALAMIN 1000 MCG/ML IJ SOLN
1000.0000 ug | Freq: Once | INTRAMUSCULAR | Status: AC
  Administered 2021-09-10: 11:00:00 1000 ug via INTRAMUSCULAR

## 2021-09-10 NOTE — Progress Notes (Addendum)
Pt here today for B12 injection. (2/4) per PCP orders.   Cyanocobalamin 1066mL injected into right deltoid. Patient tolerated injection well.    Next nurse visit scheduled for 09/16/21.  Mackie Pai, PA-C

## 2021-09-16 ENCOUNTER — Ambulatory Visit (INDEPENDENT_AMBULATORY_CARE_PROVIDER_SITE_OTHER): Payer: Medicare Other | Admitting: *Deleted

## 2021-09-16 DIAGNOSIS — E538 Deficiency of other specified B group vitamins: Secondary | ICD-10-CM | POA: Diagnosis not present

## 2021-09-16 DIAGNOSIS — D649 Anemia, unspecified: Secondary | ICD-10-CM

## 2021-09-16 MED ORDER — CYANOCOBALAMIN 1000 MCG/ML IJ SOLN
1000.0000 ug | Freq: Once | INTRAMUSCULAR | Status: AC
  Administered 2021-09-16: 14:00:00 1000 ug via INTRAMUSCULAR

## 2021-09-16 NOTE — Progress Notes (Signed)
Pt here today for B12 injection. (3/4) per PCP orders.   Cyanocobalamin 1053mL injected into left deltoid. Patient tolerated injection well.    Next nurse visit scheduled for 09/24/21

## 2021-09-24 ENCOUNTER — Telehealth: Payer: Self-pay

## 2021-09-24 ENCOUNTER — Other Ambulatory Visit (INDEPENDENT_AMBULATORY_CARE_PROVIDER_SITE_OTHER): Payer: Medicare Other

## 2021-09-24 ENCOUNTER — Ambulatory Visit (INDEPENDENT_AMBULATORY_CARE_PROVIDER_SITE_OTHER): Payer: Medicare Other | Admitting: *Deleted

## 2021-09-24 ENCOUNTER — Other Ambulatory Visit: Payer: Self-pay

## 2021-09-24 ENCOUNTER — Ambulatory Visit (INDEPENDENT_AMBULATORY_CARE_PROVIDER_SITE_OTHER): Payer: Medicare Other | Admitting: Pulmonary Disease

## 2021-09-24 ENCOUNTER — Encounter: Payer: Self-pay | Admitting: Pulmonary Disease

## 2021-09-24 VITALS — BP 118/74 | HR 66 | Ht 66.0 in | Wt 131.0 lb

## 2021-09-24 DIAGNOSIS — D649 Anemia, unspecified: Secondary | ICD-10-CM | POA: Diagnosis not present

## 2021-09-24 DIAGNOSIS — G4734 Idiopathic sleep related nonobstructive alveolar hypoventilation: Secondary | ICD-10-CM

## 2021-09-24 DIAGNOSIS — E538 Deficiency of other specified B group vitamins: Secondary | ICD-10-CM | POA: Diagnosis not present

## 2021-09-24 DIAGNOSIS — J432 Centrilobular emphysema: Secondary | ICD-10-CM | POA: Diagnosis not present

## 2021-09-24 LAB — FECAL OCCULT BLOOD, IMMUNOCHEMICAL: Fecal Occult Bld: POSITIVE — AB

## 2021-09-24 MED ORDER — CYANOCOBALAMIN 1000 MCG/ML IJ SOLN
1000.0000 ug | Freq: Once | INTRAMUSCULAR | Status: AC
  Administered 2021-09-24: 14:00:00 1000 ug via INTRAMUSCULAR

## 2021-09-24 NOTE — Patient Instructions (Signed)
Continue breztri inhaler 2 puffs twice daily  Continue to use albuterol nebulizer treatments every 4-6 hours as needed  Follow up in 6 months with pulmonary function tests

## 2021-09-24 NOTE — Addendum Note (Signed)
Addended by: Anabel Halon on: 09/24/2021 07:51 PM   Modules accepted: Orders

## 2021-09-24 NOTE — Telephone Encounter (Signed)
CRITICAL VALUE STICKER  CRITICAL VALUE: positive iFob  RECEIVER (on-site recipient of call): Kristine Garbe, [place]  DATE & TIME NOTIFIED: 09/24/21, 4:50PM  MESSENGER (representative from lab): Santiago Glad  MD NOTIFIED: yes  TIME OF NOTIFICATION: 5:02PM  RESPONSE:  Pending

## 2021-09-24 NOTE — Addendum Note (Signed)
Addended by: Anabel Halon on: 09/24/2021 07:50 PM   Modules accepted: Orders

## 2021-09-24 NOTE — Progress Notes (Addendum)
Pt here today for B12 injection. (4/4) per PCP orders.   Cyanocobalamin 101mL injected into left deltoid. Patient tolerated injection well.    Next nurse visit scheduled for  Monthly B-12 10/22/21   Mackie Pai, PA-C

## 2021-09-24 NOTE — Progress Notes (Signed)
Synopsis: Referred in September 2022 for COPD  Subjective:   PATIENT ID: Martin Santiago GENDER: male DOB: 10-10-1950, MRN: [id number]   HPI  Chief Complaint  Patient presents with   Follow-up    3 mo f/u for emphysema. States he has been doing well since last visit. Still using Breztri and albuterol daily.    Martin Santiago is a 70 year old male, daily smoker with hypertension and emphysema who returns to pulmonary clinic for COPD follow up.   Overnight oximitry showed he spent 7 hours and 17 minutes with an SpO2 less than 88%. Patient was offered supplemental oxygen but declined at that time. He was started on breztri inhaler at last visit with good improvement in his shortness of breath. He is also using albuterol nebulizer twice daily with relief. He reports he is sleeping better since starting inhaler therapy.   OV 06/25/21 He has had increasing shortness of breath, cough and wheezing over recent months.  He has been on Bevespi inhaler over the last few years with good effect but he has been having more frequent breakthrough shortness of breath and wheezing recently.  He has an as needed albuterol inhaler which he uses but does not find much relief with this inhaler.  He does not have a nebulizer machine at home.  His cough is productive with sputum production on a daily basis.  He continues to smoke daily and has smoked over the past 45 years.  He has a 60+ pack year smoking history.  High-resolution CT scan from 2017 shows extensive centrilobular emphysematous changes.  He has been seeing an ophthalmologist for retinal detachment but does not recall being treated for glaucoma.  Under his problem list there is listed secondary open-angle glaucoma of the left eye.  Past Medical History:  Diagnosis Date   Colon polyps    COPD (chronic obstructive pulmonary disease) (HCC)    Crohn's disease ([place])    Detached retina    Essential hypertension    Tropical sprue      Family History   Problem Relation Age of Onset   Colon cancer Maternal Aunt    Breast cancer Mother      Social History   Socioeconomic History   Marital status: Married    Spouse name: Not on file   Number of children: 0   Years of education: Not on file   Highest education level: Not on file  Occupational History   Occupation: retired, Personal assistant, church organist    Employer: SELF EMPLOYED  Tobacco Use   Smoking status: Every Day    Packs/day: 1.50    Years: 45.00    Pack years: 67.50    Types: Cigarettes   Smokeless tobacco: Never  Substance and Sexual Activity   Alcohol use: Yes    Alcohol/week: 0.0 standard drinks    Comment: Rare, 3-4 per yr   Drug use: No    Comment: Remote marijuana   Sexual activity: Not on file  Other Topics Concern   Not on file  Social History Narrative   Lives w/ wife & step-son (30)    Social Determinants of Health   Financial Resource Strain: Not on file  Food Insecurity: Not on file  Transportation Needs: Not on file  Physical Activity: Not on file  Stress: Not on file  Social Connections: Not on file  Intimate Partner Violence: Not on file     Allergies  Allergen Reactions   Codeine Nausea And Vomiting and Other (  See Comments)    Other reaction(s): Flushing (ALLERGY/intolerance) fainting     Outpatient Medications Prior to Visit  Medication Sig Dispense Refill   albuterol (PROVENTIL) (2.5 MG/3ML) 0.083% nebulizer solution Take 3 mLs (2.5 mg total) by nebulization in the morning and at bedtime. 180 mL 11   amLODipine (NORVASC) 10 MG tablet Take 10 mg by mouth daily. Reported on 02/25/2016     Budeson-Glycopyrrol-Formoterol (BREZTRI AEROSPHERE) 160-9-4.8 MCG/ACT AERO Inhale 2 puffs into the lungs in the morning and at bedtime. 10.7 g 6   COVID-19 mRNA bivalent vaccine, Moderna, (MODERNA COVID-19 BIVAL BOOSTER) 50 MCG/0.5ML injection Inject into the muscle. 0.5 mL 0   Iron, Ferrous Sulfate, 325 (65 Fe) MG TABS 1 tab po bid 60 tablet 1   losartan  (COZAAR) 100 MG tablet Take 100 mg by mouth daily. Reported on 02/25/2016     meloxicam (MOBIC) 15 MG tablet      omeprazole (PRILOSEC) 20 MG capsule      timolol (TIMOPTIC) 0.5 % ophthalmic solution Place 1 drop into the right eye 2 times daily.     VENTOLIN HFA 108 (90 Base) MCG/ACT inhaler      Budeson-Glycopyrrol-Formoterol (BREZTRI AEROSPHERE) 160-9-4.8 MCG/ACT AERO Inhale 2 puffs into the lungs in the morning and at bedtime. 5.9 g 0   No facility-administered medications prior to visit.    Review of Systems  Constitutional:  Negative for chills, fever, malaise/fatigue and weight loss.  HENT:  Negative for congestion, sinus pain and sore throat.   Eyes: Negative.   Respiratory:  Positive for cough, sputum production, shortness of breath and wheezing. Negative for hemoptysis.   Cardiovascular:  Negative for chest pain, palpitations, orthopnea, claudication and leg swelling.  Gastrointestinal:  Negative for abdominal pain, heartburn, nausea and vomiting.  Genitourinary: Negative.   Musculoskeletal:  Negative for joint pain and myalgias.  Skin:  Negative for rash.  Neurological:  Negative for weakness.  Endo/Heme/Allergies: Negative.   Psychiatric/Behavioral: Negative.       Objective:   Vitals:   09/24/21 1107  BP: 118/74  Pulse: 66  SpO2: 96%  Weight: 131 lb (59.4 kg)  Height: 5\' 6"  (1.676 m)     Physical Exam Constitutional:      General: He is not in acute distress. HENT:     Head: Normocephalic and atraumatic.  Eyes:     Extraocular Movements: Extraocular movements intact.     Conjunctiva/sclera: Conjunctivae normal.     Pupils: Pupils are equal, round, and reactive to light.  Cardiovascular:     Rate and Rhythm: Normal rate and regular rhythm.     Pulses: Normal pulses.     Heart sounds: Normal heart sounds. No murmur heard. Pulmonary:     Effort: Pulmonary effort is normal.     Breath sounds: Decreased air movement present. No wheezing, rhonchi or rales.   Abdominal:     General: Bowel sounds are normal.     Palpations: Abdomen is soft.  Musculoskeletal:     Right lower leg: No edema.     Left lower leg: No edema.  Lymphadenopathy:     Cervical: No cervical adenopathy.  Skin:    General: Skin is warm and dry.  Neurological:     General: No focal deficit present.     Mental Status: He is alert.  Psychiatric:        Mood and Affect: Mood normal.        Behavior: Behavior normal.  Thought Content: Thought content normal.        Judgment: Judgment normal.   CBC    Component Value Date/Time   WBC 6.5 08/31/2021 1422   RBC 4.63 08/31/2021 1422   HGB 8.9 (L) 08/31/2021 1422   HCT 30.3 (L) 08/31/2021 1422   PLT 325.0 08/31/2021 1422   MCV 65.4 (L) 08/31/2021 1422   MCH 31.1 06/14/2017 0705   MCHC 29.2 (L) 08/31/2021 1422   RDW 19.6 (H) 08/31/2021 1422   LYMPHSABS 1.3 08/25/2021 0749   MONOABS 0.8 08/25/2021 0749   EOSABS 0.3 08/25/2021 0749   BASOSABS 0.1 08/25/2021 0749   Chest imaging: HRCT Chest 2017 1. Moderate to severe centrilobular emphysema. No evidence of interstitial lung disease. 2. Basilar subpleural nodules are likely subpleural lymph nodes.  PFT: No flowsheet data found.  Assessment & Plan:   Centrilobular emphysema ([place]) - Plan: Pulmonary Function Test  Nocturnal hypoxemia  Discussion: Martin Santiago is a 70 year old male, daily smoker with hypertension and emphysema who returns to pulmonary clinic for COPD follow up.   He has been doing well on breztri inhaler and albuterol nebulizer treatments.   We discussed nocturnal supplemental oxygen once again and he does not wish to have it ordered at this time. I explained the potential impact on his heart and brain health, which he understood but does not want to be burdened by wearing something in his sleep.   Follow-up in 6 months with pulmonary function tests.  Freda Jackson, MD [facility] Office: [phone]   Current  Outpatient Medications:    albuterol (PROVENTIL) (2.5 MG/3ML) 0.083% nebulizer solution, Take 3 mLs (2.5 mg total) by nebulization in the morning and at bedtime., Disp: 180 mL, Rfl: 11   amLODipine (NORVASC) 10 MG tablet, Take 10 mg by mouth daily. Reported on 02/25/2016, Disp: , Rfl:    Budeson-Glycopyrrol-Formoterol (BREZTRI AEROSPHERE) 160-9-4.8 MCG/ACT AERO, Inhale 2 puffs into the lungs in the morning and at bedtime., Disp: 10.7 g, Rfl: 6   COVID-19 mRNA bivalent vaccine, Moderna, (MODERNA COVID-19 BIVAL BOOSTER) 50 MCG/0.5ML injection, Inject into the muscle., Disp: 0.5 mL, Rfl: 0   Iron, Ferrous Sulfate, 325 (65 Fe) MG TABS, 1 tab po bid, Disp: 60 tablet, Rfl: 1   losartan (COZAAR) 100 MG tablet, Take 100 mg by mouth daily. Reported on 02/25/2016, Disp: , Rfl:    meloxicam (MOBIC) 15 MG tablet, , Disp: , Rfl:    omeprazole (PRILOSEC) 20 MG capsule, , Disp: , Rfl:    timolol (TIMOPTIC) 0.5 % ophthalmic solution, Place 1 drop into the right eye 2 times daily., Disp: , Rfl:    VENTOLIN HFA 108 (90 Base) MCG/ACT inhaler, , Disp: , Rfl:

## 2021-09-24 NOTE — Progress Notes (Signed)
IFOB drop off.

## 2021-09-28 NOTE — Telephone Encounter (Signed)
Attempted to contact patient with results/recommendations. No answer. Patient has f/u appt with PCP on Friday, 10/01/21.

## 2021-10-01 ENCOUNTER — Ambulatory Visit (INDEPENDENT_AMBULATORY_CARE_PROVIDER_SITE_OTHER): Payer: Medicare Other | Admitting: Medical

## 2021-10-01 ENCOUNTER — Encounter: Payer: Self-pay | Admitting: Medical

## 2021-10-01 VITALS — BP 140/80 | HR 76 | Resp 18 | Ht 66.0 in | Wt 131.0 lb

## 2021-10-01 DIAGNOSIS — E538 Deficiency of other specified B group vitamins: Secondary | ICD-10-CM

## 2021-10-01 DIAGNOSIS — J449 Chronic obstructive pulmonary disease, unspecified: Secondary | ICD-10-CM

## 2021-10-01 DIAGNOSIS — D649 Anemia, unspecified: Secondary | ICD-10-CM | POA: Diagnosis not present

## 2021-10-01 DIAGNOSIS — K921 Melena: Secondary | ICD-10-CM

## 2021-10-01 DIAGNOSIS — E611 Iron deficiency: Secondary | ICD-10-CM

## 2021-10-01 LAB — CBC WITH DIFFERENTIAL/PLATELET
Basophils Absolute: 0.1 10*3/uL (ref 0.0–0.1)
Basophils Relative: 0.9 % (ref 0.0–3.0)
Eosinophils Absolute: 0.1 10*3/uL (ref 0.0–0.7)
Eosinophils Relative: 2.5 % (ref 0.0–5.0)
HCT: 40.2 % (ref 39.0–52.0)
Hemoglobin: 12.3 g/dL — ABNORMAL LOW (ref 13.0–17.0)
Lymphocytes Relative: 12.2 % (ref 12.0–46.0)
Lymphs Abs: 0.7 10*3/uL (ref 0.7–4.0)
MCHC: 30.7 g/dL (ref 30.0–36.0)
MCV: 76.4 fl — ABNORMAL LOW (ref 78.0–100.0)
Monocytes Absolute: 0.5 10*3/uL (ref 0.1–1.0)
Monocytes Relative: 8.3 % (ref 3.0–12.0)
Neutro Abs: 4.4 10*3/uL (ref 1.4–7.7)
Neutrophils Relative %: 76.1 % (ref 43.0–77.0)
Platelets: 236 10*3/uL (ref 150.0–400.0)
RBC: 5.27 Mil/uL (ref 4.22–5.81)
RDW: 31.7 % — ABNORMAL HIGH (ref 11.5–15.5)
WBC: 5.8 10*3/uL (ref 4.0–10.5)

## 2021-10-01 LAB — IRON: Iron: 297 ug/dL — ABNORMAL HIGH (ref 42–165)

## 2021-10-01 MED ORDER — AMLODIPINE BESYLATE 10 MG PO TABS: 10.0000 mg | ORAL_TABLET | Freq: Every day | ORAL | 3 refills | Status: DC

## 2021-10-01 MED ORDER — LOSARTAN POTASSIUM 100 MG PO TABS: 100.0000 mg | ORAL_TABLET | Freq: Every day | ORAL | 3 refills | Status: DC

## 2021-10-01 NOTE — Patient Instructions (Addendum)
Hx of anemia with low iron and recent + ifob. Place cbc and iron test today. Will order stat as we are approaching the weekend. Already placed gi referral. If hb/hct dropping will try to expidite the gi referral. Also if significant drop as weekend approaches may advise ED evaluation.  B12 deficiency. Continue b12 injections.  Htn- continue losartan and amlodipine.  Jerrye Bushy- continue omeprazole.  For copd continue inhalers and consider the recommendation for 02 use per pulmonologist recommendations.  Some potential restless leg per your description. Continue your conservative measures. Gabapentin low dose at night other option if you decide you want to try.  Follow up date to be detemined after lab review.

## 2021-10-01 NOTE — Progress Notes (Signed)
Subjective:    Patient ID: Martin Santiago, male    DOB: Jul 14, 1951, 71 y.o.   MRN: [id number]  HPI  Pt in for follow up on anemia with recent ifob + as well.   On review of careeverywhere his 02/13/2021 labs showed hb 8.7 and hct 28.6. On review I only see Eye MD appt but no primary care of hematologist appt.   On review his former pcp was Dr. Nevada Crane.   He does have history of chron's.   On 09-24-2021 placed referral to GI MD.   Here today for follow up on anemia, +ifob and will repeat labs today.  Pt has low b12 and low iron.  I prescribed oral iron and he is getting b12 infections.    Review of Systems  Constitutional:  Positive for fatigue. Negative for chills and fever.       Still some fatigued. But some better.  Respiratory:  Negative for cough, chest tightness, shortness of breath and wheezing.   Cardiovascular:  Negative for chest pain and palpitations.  Gastrointestinal:  Positive for blood in stool. Negative for abdominal distention, abdominal pain, anal bleeding, constipation, diarrhea, nausea and rectal pain.       By ifob +. No gross blood.  Genitourinary:  Negative for dysuria and frequency.  Musculoskeletal:  Negative for back pain.  Skin:  Negative for rash.  Neurological:  Negative for dizziness, numbness and headaches.       Some restless legs at night. Trouble sleeping when that occurs.  Hematological:  Negative for adenopathy. Does not bruise/bleed easily.  Psychiatric/Behavioral:  Negative for behavioral problems and confusion.     Past Medical History:  Diagnosis Date   Colon polyps    COPD (chronic obstructive pulmonary disease) (HCC)    Crohn's disease ([place])    Detached retina    Essential hypertension    Tropical sprue      Social History   Socioeconomic History   Marital status: Married    Spouse name: Not on file   Number of children: 0   Years of education: Not on file   Highest education level: Not on file  Occupational History    Occupation: retired, Personal assistant, church organist    Employer: SELF EMPLOYED  Tobacco Use   Smoking status: Every Day    Packs/day: 1.50    Years: 45.00    Pack years: 67.50    Types: Cigarettes   Smokeless tobacco: Never  Substance and Sexual Activity   Alcohol use: Yes    Alcohol/week: 0.0 standard drinks    Comment: Rare, 3-4 per yr   Drug use: No    Comment: Remote marijuana   Sexual activity: Not on file  Other Topics Concern   Not on file  Social History Narrative   Lives w/ wife & step-son (72)    Social Determinants of Health   Financial Resource Strain: Not on file  Food Insecurity: Not on file  Transportation Needs: Not on file  Physical Activity: Not on file  Stress: Not on file  Social Connections: Not on file  Intimate Partner Violence: Not on file    Past Surgical History:  Procedure Laterality Date   BOWEL RESECTION  05/1994   Mikel Cella:  ? Crohn's   COLONOSCOPY     Dr Derrill Center, [facility]:  Hx polyps. Cannot remmeber date   COLONOSCOPY N/A 12/03/2012   Procedure: COLONOSCOPY;  Surgeon: Daneil Dolin, MD;  Location: [facility];  Service: Endoscopy;  Laterality: N/A;  7:30   INGUINAL HERNIA REPAIR Right    RETINAL DETACHMENT SURGERY  07/2006   RETINAL DETACHMENT SURGERY  09/2006   RETINAL DETACHMENT SURGERY  01/2007    Family History  Problem Relation Age of Onset   Colon cancer Maternal Aunt    Breast cancer Mother     Allergies  Allergen Reactions   Codeine Nausea And Vomiting and Other (See Comments)    Other reaction(s): Flushing (ALLERGY/intolerance) fainting    Current Outpatient Medications on File Prior to Visit  Medication Sig Dispense Refill   albuterol (PROVENTIL) (2.5 MG/3ML) 0.083% nebulizer solution Take 3 mLs (2.5 mg total) by nebulization in the morning and at bedtime. 180 mL 11   amLODipine (NORVASC) 10 MG tablet Take 10 mg by mouth daily. Reported on 02/25/2016     Budeson-Glycopyrrol-Formoterol (BREZTRI AEROSPHERE) 160-9-4.8 MCG/ACT  AERO Inhale 2 puffs into the lungs in the morning and at bedtime. 10.7 g 6   COVID-19 mRNA bivalent vaccine, Moderna, (MODERNA COVID-19 BIVAL BOOSTER) 50 MCG/0.5ML injection Inject into the muscle. 0.5 mL 0   Iron, Ferrous Sulfate, 325 (65 Fe) MG TABS 1 tab po bid 60 tablet 1   losartan (COZAAR) 100 MG tablet Take 100 mg by mouth daily. Reported on 02/25/2016     meloxicam (MOBIC) 15 MG tablet      omeprazole (PRILOSEC) 20 MG capsule      timolol (TIMOPTIC) 0.5 % ophthalmic solution Place 1 drop into the right eye 2 times daily.     VENTOLIN HFA 108 (90 Base) MCG/ACT inhaler      No current facility-administered medications on file prior to visit.    There were no vitals taken for this visit.      Objective:   Physical Exam  General- No acute distress. Pleasant patient. Neck- Full range of motion, no jvd Lungs- Clear, even and unlabored. Heart- regular rate and rhythm. Neurologic- CNII- XII grossly intact.  Skin- normal skin tone. Abdomen- soft, nt, nd, +bs.     Assessment & Plan:   Patient Instructions  Hx of anemia with low iron and recent + ifob. Place cbc and iron test today. Will order stat as we are approaching the weekend. Already placed gi referral. If hb/hct dropping will try to expidite the gi referral. Also if significant drop as weekend approaches may advise ED evaluation.  B12 deficiency. Continue b12 injections.  Htn- continue losartan and amlodipine.  Jerrye Bushy- continue omeprazole.  For copd continue inhalers and consider the recommendation for 02 use per pulmonologist recommendations.  Some potential restless leg per your description. Continue your conservative measures. Gabapentin low dose at night other option if you decide you want to try.  Follow up date to be detemined after lab review.   Mackie Pai, PA-C   Time spent with patient today was  41 minutes which consisted of chart revdiew, discussing diagnosis, work up treatment and documentation.

## 2021-10-22 ENCOUNTER — Ambulatory Visit: Payer: Medicare Other

## 2021-10-25 ENCOUNTER — Other Ambulatory Visit: Payer: Self-pay | Admitting: Medical

## 2021-10-25 ENCOUNTER — Ambulatory Visit (INDEPENDENT_AMBULATORY_CARE_PROVIDER_SITE_OTHER): Payer: Medicare Other

## 2021-10-25 ENCOUNTER — Encounter: Payer: Self-pay | Admitting: Medical

## 2021-10-25 DIAGNOSIS — E538 Deficiency of other specified B group vitamins: Secondary | ICD-10-CM | POA: Diagnosis not present

## 2021-10-25 MED ORDER — CYANOCOBALAMIN 1000 MCG/ML IJ SOLN
1000.0000 ug | Freq: Once | INTRAMUSCULAR | Status: AC
  Administered 2021-10-25: 1000 ug via INTRAMUSCULAR

## 2021-10-25 NOTE — Progress Notes (Addendum)
Pt here for monthly B12 injection per Elise Benne  B12 1048mcg given right deltoid IM, and pt tolerated injection well.  Next B12 injection scheduled for next month.  Mackie Pai, PA-C

## 2021-10-26 MED ORDER — LOSARTAN POTASSIUM 100 MG PO TABS: 100.0000 mg | ORAL_TABLET | Freq: Every day | ORAL | 3 refills | Status: DC

## 2021-10-26 MED ORDER — AMLODIPINE BESYLATE 10 MG PO TABS: 10.0000 mg | ORAL_TABLET | Freq: Every day | ORAL | 3 refills | Status: DC

## 2021-10-26 MED ORDER — IRON (FERROUS SULFATE) 325 (65 FE) MG PO TABS: ORAL_TABLET | ORAL | 1 refills | Status: DC

## 2021-10-27 ENCOUNTER — Ambulatory Visit: Payer: Medicare Other | Admitting: Gastroenterology

## 2021-10-27 ENCOUNTER — Telehealth: Payer: Self-pay | Admitting: Medical

## 2021-10-27 ENCOUNTER — Telehealth: Payer: Self-pay | Admitting: Gastroenterology

## 2021-10-27 NOTE — Telephone Encounter (Signed)
Chart opened to rx med, order lab, review chart, respond to my chart message or send message to staff member  

## 2021-10-27 NOTE — Telephone Encounter (Signed)
Pt requested refills prescribed by a previous dr. Did advise pt to make an appointment to discuss with Percell Miller about refilling those medications, but he declined and stated he has been in here enough times and just wants a refill. Please advise.   Medication: omeprazole (PRILOSEC) 20 MG capsule   meloxicam (MOBIC) 15 MG tablet  Has the patient contacted their pharmacy? No.   Preferred Pharmacy: [id number] - [place], [place] - [address] AT [facility] [address]  [address], [place] [address]  Phone:  [phone]  Fax:  [phone]

## 2021-10-27 NOTE — Telephone Encounter (Signed)
Good Morning Dr. Cathleen Corti,    Patient called to cancel appointment with you this morning at 11:00 due to not being able to leave his wife because she has brain cancer and he could not find anyone to stay with her.  Patient was rescheduled for 2/21 at 2:00.

## 2021-10-28 NOTE — Telephone Encounter (Signed)
Called pt and lvm to return call.  

## 2021-10-29 MED ORDER — OMEPRAZOLE 20 MG PO CPDR: 20.0000 mg | DELAYED_RELEASE_CAPSULE | Freq: Every day | ORAL | 11 refills | Status: DC

## 2021-10-29 NOTE — Telephone Encounter (Signed)
Patient is scheduled for gastro 11/16/21   Pt stated he takes omeprazole once daily

## 2021-10-31 ENCOUNTER — Other Ambulatory Visit: Payer: Self-pay | Admitting: Medical

## 2021-11-16 ENCOUNTER — Ambulatory Visit: Payer: Medicare Other | Admitting: Gastroenterology

## 2021-11-17 DIAGNOSIS — H3321 Serous retinal detachment, right eye: Secondary | ICD-10-CM | POA: Diagnosis not present

## 2021-11-17 DIAGNOSIS — H40001 Preglaucoma, unspecified, right eye: Secondary | ICD-10-CM | POA: Diagnosis not present

## 2021-11-17 DIAGNOSIS — Z961 Presence of intraocular lens: Secondary | ICD-10-CM | POA: Diagnosis not present

## 2021-11-17 DIAGNOSIS — H2702 Aphakia, left eye: Secondary | ICD-10-CM | POA: Diagnosis not present

## 2021-11-23 ENCOUNTER — Ambulatory Visit (INDEPENDENT_AMBULATORY_CARE_PROVIDER_SITE_OTHER): Payer: Medicare Other

## 2021-11-23 DIAGNOSIS — E538 Deficiency of other specified B group vitamins: Secondary | ICD-10-CM

## 2021-11-23 MED ORDER — CYANOCOBALAMIN 1000 MCG/ML IJ SOLN
1000.0000 ug | Freq: Once | INTRAMUSCULAR | Status: AC
  Administered 2021-11-23: 1000 ug via INTRAMUSCULAR

## 2021-11-23 NOTE — Progress Notes (Addendum)
Martin Santiago is a 71 y.o. male  presents to the office today for a B12 injection, per physician's orders. Original order: per Mackie Pai, PA-C  cyanocobalamin, 1000 mg/ml IM was administered in left deltoid today.   Patient tolerated injection well.   Next appointment:  Pt will call and schedule next appointment.   Mackie Pai, PA-C

## 2021-11-24 ENCOUNTER — Ambulatory Visit: Payer: Medicare Other

## 2021-12-28 ENCOUNTER — Encounter: Payer: Self-pay | Admitting: Pulmonary Disease

## 2021-12-28 NOTE — Telephone Encounter (Signed)
Dr Erin Fulling please advise : ? ? ?The Albuterol along with Breztri inhaler are doing a very good job for me. Since the congestion seems to be a very thick mucus, do you think daily Mucinex would be a good addition? If so, what dosage? ?  ?Thanks! ?Martin Santiago ?

## 2022-01-03 ENCOUNTER — Ambulatory Visit (INDEPENDENT_AMBULATORY_CARE_PROVIDER_SITE_OTHER): Payer: Medicare Other

## 2022-01-03 DIAGNOSIS — E538 Deficiency of other specified B group vitamins: Secondary | ICD-10-CM

## 2022-01-03 MED ORDER — CYANOCOBALAMIN 1000 MCG/ML IJ SOLN
1000.0000 ug | Freq: Once | INTRAMUSCULAR | Status: AC
  Administered 2022-01-03: 1000 ug via INTRAMUSCULAR

## 2022-01-03 NOTE — Progress Notes (Addendum)
Martin Santiago is a 71 y.o. male  presents to the office today for a B12 injection, per physician's orders. ?Original order: 08/26/21: "Will give b12 vit injection next week. Recommend getting set up for weekly b12 vitamin injection over next 4 weeks followed by monthly injection for 5 months. Then repeat b12 level in 6 months." per Mackie Pai, PA-C ?Cyanocobalamin, 1000 mg/ml IM was administered in R deltoid today.  ?Patient due for follow up labs/provider appt: No. Date due: 02/15/22, appt made No ?Patient next injection due: 1 month, appt made No- pt will call due to ride arrangements. He is aware that next injection will be due around the same time as his follow up.  ? ?Creft, Kristine Garbe L ? ?Mackie Pai, PA-C  ? ?

## 2022-01-04 ENCOUNTER — Other Ambulatory Visit (HOSPITAL_BASED_OUTPATIENT_CLINIC_OR_DEPARTMENT_OTHER): Payer: Self-pay

## 2022-01-06 ENCOUNTER — Ambulatory Visit (INDEPENDENT_AMBULATORY_CARE_PROVIDER_SITE_OTHER): Payer: Medicare Other

## 2022-01-06 VITALS — Ht 66.0 in | Wt 125.0 lb

## 2022-01-06 DIAGNOSIS — Z0000 Encounter for general adult medical examination without abnormal findings: Secondary | ICD-10-CM

## 2022-01-06 NOTE — Progress Notes (Addendum)
? ?Subjective:  ? Martin Santiago is a 71 y.o. male who presents for an Initial Medicare Annual Wellness Visit. ?Virtual Visit via Telephone Note ? ?I connected with  Martin Santiago on 01/06/22 at 12:00 PM EDT by telephone and verified that I am speaking with the correct person using two identifiers. ? ?Location: ?Patient: HOME ?Provider: [facility] ?Persons participating in the virtual visit: patient/Nurse Health Advisor ?  ?I discussed the limitations, risks, security and privacy concerns of performing an evaluation and management service by telephone and the availability of in person appointments. The patient expressed understanding and agreed to proceed. ? ?Interactive audio and video telecommunications were attempted between this nurse and patient, however failed, due to patient having technical difficulties OR patient did not have access to video capability.  We continued and completed visit with audio only. ? ?Some vital signs may be absent or patient reported.  ? ?Chriss Driver, LPN ? ?Review of Systems    ? ?Cardiac Risk Factors include: advanced age (>79mn, >>65women);hypertension;male gender;smoking/ tobacco exposure;sedentary lifestyle;Other (see comment), Risk factor comments: COPD ? ?   ?Objective:  ?  ?Today's Vitals  ? 01/06/22 1158  ?Weight: 125 lb (56.7 kg)  ?Height: '5\' 6"'$  (1.676 m)  ? ?Body mass index is 20.18 kg/m?. ? ? ?  01/06/2022  ? 12:25 PM 06/13/2017  ? 10:42 AM 12/03/2012  ?  7:04 AM  ?Advanced Directives  ?Does Patient Have a Medical Advance Directive? Yes No;Yes Patient has advance directive, copy not in chart  ?Type of [place] Living will Living will  ?Copy of [place]? Yes - validated most recent copy scanned in chart (See row information)    ?Would patient like information on creating a medical advance directive?  No - Patient declined   ?Pre-existing out of facility DNR order (yellow form or pink MOST  form)   No  ? ? ?Current Medications (verified) ?Outpatient Encounter Medications as of 01/06/2022  ?Medication Sig  ? albuterol (PROVENTIL) (2.5 MG/3ML) 0.083% nebulizer solution Take 3 mLs (2.5 mg total) by nebulization in the morning and at bedtime.  ? amLODipine (NORVASC) 10 MG tablet Take 1 tablet (10 mg total) by mouth daily. Reported on 02/25/2016  ? Budeson-Glycopyrrol-Formoterol (BREZTRI AEROSPHERE) 160-9-4.8 MCG/ACT AERO Inhale 2 puffs into the lungs in the morning and at bedtime.  ? COVID-19 mRNA bivalent vaccine, Moderna, (MODERNA COVID-19 BIVAL BOOSTER) 50 MCG/0.5ML injection Inject into the muscle.  ? FEROSUL 325 (65 Fe) MG tablet TAKE 1 TABLET BY MOUTH TWICE DAILY  ? losartan (COZAAR) 100 MG tablet Take 1 tablet (100 mg total) by mouth daily. Reported on 02/25/2016  ? meloxicam (MOBIC) 15 MG tablet   ? omeprazole (PRILOSEC) 20 MG capsule Take 1 capsule (20 mg total) by mouth daily.  ? timolol (TIMOPTIC) 0.5 % ophthalmic solution Place 1 drop into the right eye 2 times daily.  ? VENTOLIN HFA 108 (90 Base) MCG/ACT inhaler   ? ?No facility-administered encounter medications on file as of 01/06/2022.  ? ? ?Allergies (verified) ?Codeine  ? ?History: ?Past Medical History:  ?Diagnosis Date  ? Colon polyps   ? COPD (chronic obstructive pulmonary disease) ([place]   ? Crohn's disease ([place]   ? Detached retina   ? Essential hypertension   ? Tropical sprue   ? ?Past Surgical History:  ?Procedure Laterality Date  ? BOWEL RESECTION  05/1994  ? [place]:  ? Crohn's  ? COLONOSCOPY    ?  Dr [facility]:  Hx polyps. Cannot remmeber date  ? COLONOSCOPY N/A 12/03/2012  ? Procedure: COLONOSCOPY;  Surgeon: Daneil Dolin, MD;  [facility];  Service: Endoscopy;  Laterality: N/A;  7:30  ? INGUINAL HERNIA REPAIR Right   ? RETINAL DETACHMENT SURGERY  07/2006  ? RETINAL DETACHMENT SURGERY  09/2006  ? RETINAL DETACHMENT SURGERY  01/2007  ? ?Family History  ?Problem Relation Age of Onset  ? Colon cancer Maternal Aunt   ?  Breast cancer Mother   ? ?Social History  ? ?Socioeconomic History  ? Marital status: Widowed  ?  Spouse name: Not on file  ? Number of children: 0  ? Years of education: Not on file  ? Highest education level: Not on file  ?Occupational History  ? Occupation: retired, Personal assistant, church organist  ?  Employer: [employer]  ?Tobacco Use  ? Smoking status: Every Day  ?  Packs/day: 1.50  ?  Years: 45.00  ?  Pack years: 67.50  ?  Types: Cigarettes  ? Smokeless tobacco: Never  ?Substance and Sexual Activity  ? Alcohol use: Yes  ?  Alcohol/week: 0.0 standard drinks  ?  Comment: Rare, 3-4 per yr  ? Drug use: No  ?  Comment: Remote marijuana  ? Sexual activity: Not Currently  ?Other Topics Concern  ? Not on file  ?Social History Narrative  ? Wife, Martin Santiago passed away in 29-Dec-2021 from [place].  ? 1 step-daughter who lives in [place].  ? ?Social Determinants of Health  ? ?Financial Resource Strain: Low Risk   ? Difficulty of Paying Living Expenses: Not hard at all  ?Food Insecurity: No Food Insecurity  ? Worried About Charity fundraiser in the Last Year: Never true  ? Ran Out of Food in the Last Year: Never true  ?Transportation Needs: No Transportation Needs  ? Lack of Transportation (Medical): No  ? Lack of Transportation (Non-Medical): No  ?Physical Activity: Inactive  ? Days of Exercise per Week: 0 days  ? Minutes of Exercise per Session: 0 min  ?Stress: No Stress Concern Present  ? Feeling of Stress : Only a little  ?Social Connections: Moderately Isolated  ? Frequency of Communication with Friends and Family: More than three times a week  ? Frequency of Social Gatherings with Friends and Family: More than three times a week  ? Attends Religious Services: 1 to 4 times per year  ? Active Member of Clubs or Organizations: No  ? Attends Archivist Meetings: Never  ? Marital Status: Widowed  ? ? ?Tobacco Counseling ?Ready to quit: Not Answered ?Counseling given: Not Answered ? ? ?Clinical  Intake: ? ?Pre-visit preparation completed: No ? ?Pain : No/denies pain ? ?  ? ?BMI - recorded: 20.18 ?Nutritional Status: BMI of 19-24  Normal ?Nutritional Risks: None ?Diabetes: No ? ?How often do you need to have someone help you when you read instructions, pamphlets, or other written materials from your doctor or pharmacy?: 1 - Never ? ?Diabetic?NO ? ?Interpreter Needed?: No ? ?Information entered by :: mj Kadden Osterhout, lpn ? ? ?Activities of Daily Living ? ?  01/06/2022  ? 12:29 PM 08/18/2021  ?  1:59 PM  ?In your present state of health, do you have any difficulty performing the following activities:  ?Hearing? 0 0  ?Vision? 0 0  ?Difficulty concentrating or making decisions? 0 0  ?Walking or climbing stairs? 0 0  ?Dressing or bathing? 0 0  ?Doing  errands, shopping? 0 0  ?Preparing Food and eating ? N   ?Using the Toilet? N   ?In the past six months, have you accidently leaked urine? N   ?Do you have problems with loss of bowel control? N   ?Managing your Medications? N   ?Managing your Finances? N   ?Housekeeping or managing your Housekeeping? N   ? ? ?Patient Care Team: ?Saguier, Iris Pert as PCP - General (Internal Medicine) ? ?Indicate any recent Medical Services you may have received from other than [facility] providers in the past year (date may be approximate). ? ?   ?Assessment:  ? This is a routine wellness examination for Martin Santiago. ? ?Hearing/Vision screen ?Hearing Screening - Comments:: No hearing issues.  ?Vision Screening - Comments:: Glasses. Hx of detatched retina L eye in 2008 and R eye in 01/2021. 20/30 in March 2023. Dr[facility] Fransisca Connors [facility].  ? ?Dietary issues and exercise activities discussed: ?Current Exercise Habits: The patient does not participate in regular exercise at present, Exercise limited by: cardiac condition(s);respiratory conditions(s) ? ? Goals Addressed   ? ?  ?  ?  ?  ? This Visit's Progress  ?  Exercise 150 min/wk Moderate Activity     ?  Encouraged pt to move more as  tolerated.  ?  ? ?  ? ?Depression Screen ? ?  01/06/2022  ? 12:11 PM 08/18/2021  ?  1:59 PM  ?PHQ 2/9 Scores  ?PHQ - 2 Score 1 1  ?  ?Fall Risk ? ?  01/06/2022  ? 12:25 PM 08/18/2021  ?  1:59 PM 04/21/2017  ?  1:34 PM  ?Fall Risk   ?Fa

## 2022-01-06 NOTE — Patient Instructions (Signed)
Martin Santiago , ?Thank you for taking time to come for your Medicare Wellness Visit. I appreciate your ongoing commitment to your health goals. Please review the following plan we discussed and let me know if I can assist you in the future.  ? ?Screening recommendations/referrals: ?Colonoscopy: Done 12/03/2012 Repeat in 10 years ? ?Recommended yearly ophthalmology/optometry visit for glaucoma screening and checkup ?Recommended yearly dental visit for hygiene and checkup ? ?Vaccinations: ?Influenza vaccine: Done 06/25/2021 Repeat annually ? ?Pneumococcal vaccine: Done 06/15/2017. Second dose due at your convenience.  ?Tdap vaccine: Due Repeat in 10 years ? ?Shingles vaccine: Discussed.   ?Covid-19: Done 07/13/2021 01/22/2021 07/17/20 11/29/2019 and 10/31/2019 ? ?Advanced directives: Copy scanned into chart. ? ?Conditions/risks identified: Aim for 30 minutes of exercise, 6-8 glasses of water, and 5 servings of fruits and vegetables each day. ? ? ?Next appointment: Follow up in one year for your annual wellness visit. 2024. ? ?Preventive Care 50 Years and Older, Male ? ?Preventive care refers to lifestyle choices and visits with your health care provider that can promote health and wellness. ?What does preventive care include? ?A yearly physical exam. This is also called an annual well check. ?Dental exams once or twice a year. ?Routine eye exams. Ask your health care provider how often you should have your eyes checked. ?Personal lifestyle choices, including: ?Daily care of your teeth and gums. ?Regular physical activity. ?Eating a healthy diet. ?Avoiding tobacco and drug use. ?Limiting alcohol use. ?Practicing safe sex. ?Taking low doses of aspirin every day. ?Taking vitamin and mineral supplements as recommended by your health care provider. ?What happens during an annual well check? ?The services and screenings done by your health care provider during your annual well check will depend on your age, overall health, lifestyle  risk factors, and family history of disease. ?Counseling  ?Your health care provider may ask you questions about your: ?Alcohol use. ?Tobacco use. ?Drug use. ?Emotional well-being. ?Home and relationship well-being. ?Sexual activity. ?Eating habits. ?History of falls. ?Memory and ability to understand (cognition). ?Work and work Statistician. ?Screening  ?You may have the following tests or measurements: ?Height, weight, and BMI. ?Blood pressure. ?Lipid and cholesterol levels. These may be checked every 5 years, or more frequently if you are over 66 years old. ?Skin check. ?Lung cancer screening. You may have this screening every year starting at age 25 if you have a 30-pack-year history of smoking and currently smoke or have quit within the past 15 years. ?Fecal occult blood test (FOBT) of the stool. You may have this test every year starting at age 29. ?Flexible sigmoidoscopy or colonoscopy. You may have a sigmoidoscopy every 5 years or a colonoscopy every 10 years starting at age 78. ?Prostate cancer screening. Recommendations will vary depending on your family history and other risks. ?Hepatitis C blood test. ?Hepatitis B blood test. ?Sexually transmitted disease (STD) testing. ?Diabetes screening. This is done by checking your blood sugar (glucose) after you have not eaten for a while (fasting). You may have this done every 1-3 years. ?Abdominal aortic aneurysm (AAA) screening. You may need this if you are a current or former smoker. ?Osteoporosis. You may be screened starting at age 12 if you are at high risk. ?Talk with your health care provider about your test results, treatment options, and if necessary, the need for more tests. ?Vaccines  ?Your health care provider may recommend certain vaccines, such as: ?Influenza vaccine. This is recommended every year. ?Tetanus, diphtheria, and acellular pertussis (Tdap, Td) vaccine. You  may need a Td booster every 10 years. ?Zoster vaccine. You may need this after age  52. ?Pneumococcal 13-valent conjugate (PCV13) vaccine. One dose is recommended after age 86. ?Pneumococcal polysaccharide (PPSV23) vaccine. One dose is recommended after age 49. ?Talk to your health care provider about which screenings and vaccines you need and how often you need them. ?This information is not intended to replace advice given to you by your health care provider. Make sure you discuss any questions you have with your health care provider. ?Document Released: 10/09/2015 Document Revised: 06/01/2016 Document Reviewed: 07/14/2015 ?Elsevier Interactive Patient Education ? 2017 [place]. ? ?Fall Prevention in the Home ?Falls can cause injuries. They can happen to people of all ages. There are many things you can do to make your home safe and to help prevent falls. ?What can I do on the outside of my home? ?Regularly fix the edges of walkways and driveways and fix any cracks. ?Remove anything that might make you trip as you walk through a door, such as a raised step or threshold. ?Trim any bushes or trees on the path to your home. ?Use bright outdoor lighting. ?Clear any walking paths of anything that might make someone trip, such as rocks or tools. ?Regularly check to see if handrails are loose or broken. Make sure that both sides of any steps have handrails. ?Any raised decks and porches should have guardrails on the edges. ?Have any leaves, snow, or ice cleared regularly. ?Use sand or salt on walking paths during winter. ?Clean up any spills in your garage right away. This includes oil or grease spills. ?What can I do in the bathroom? ?Use night lights. ?Install grab bars by the toilet and in the tub and shower. Do not use towel bars as grab bars. ?Use non-skid mats or decals in the tub or shower. ?If you need to sit down in the shower, use a plastic, non-slip stool. ?Keep the floor dry. Clean up any water that spills on the floor as soon as it happens. ?Remove soap buildup in the tub or shower  regularly. ?Attach bath mats securely with double-sided non-slip rug tape. ?Do not have throw rugs and other things on the floor that can make you trip. ?What can I do in the bedroom? ?Use night lights. ?Make sure that you have a light by your bed that is easy to reach. ?Do not use any sheets or blankets that are too big for your bed. They should not hang down onto the floor. ?Have a firm chair that has side arms. You can use this for support while you get dressed. ?Do not have throw rugs and other things on the floor that can make you trip. ?What can I do in the kitchen? ?Clean up any spills right away. ?Avoid walking on wet floors. ?Keep items that you use a lot in easy-to-reach places. ?If you need to reach something above you, use a strong step stool that has a grab bar. ?Keep electrical cords out of the way. ?Do not use floor polish or wax that makes floors slippery. If you must use wax, use non-skid floor wax. ?Do not have throw rugs and other things on the floor that can make you trip. ?What can I do with my stairs? ?Do not leave any items on the stairs. ?Make sure that there are handrails on both sides of the stairs and use them. Fix handrails that are broken or loose. Make sure that handrails are as long as the  stairways. ?Check any carpeting to make sure that it is firmly attached to the stairs. Fix any carpet that is loose or worn. ?Avoid having throw rugs at the top or bottom of the stairs. If you do have throw rugs, attach them to the floor with carpet tape. ?Make sure that you have a light switch at the top of the stairs and the bottom of the stairs. If you do not have them, ask someone to add them for you. ?What else can I do to help prevent falls? ?Wear shoes that: ?Do not have high heels. ?Have rubber bottoms. ?Are comfortable and fit you well. ?Are closed at the toe. Do not wear sandals. ?If you use a stepladder: ?Make sure that it is fully opened. Do not climb a closed stepladder. ?Make sure that  both sides of the stepladder are locked into place. ?Ask someone to hold it for you, if possible. ?Clearly mark and make sure that you can see: ?Any grab bars or handrails. ?First and last steps. ?Where t

## 2022-02-02 ENCOUNTER — Ambulatory Visit (INDEPENDENT_AMBULATORY_CARE_PROVIDER_SITE_OTHER): Payer: Medicare Other

## 2022-02-02 ENCOUNTER — Other Ambulatory Visit: Payer: Self-pay | Admitting: Pulmonary Disease

## 2022-02-02 VITALS — Wt 125.6 lb

## 2022-02-02 DIAGNOSIS — Z23 Encounter for immunization: Secondary | ICD-10-CM

## 2022-02-02 DIAGNOSIS — E538 Deficiency of other specified B group vitamins: Secondary | ICD-10-CM | POA: Diagnosis not present

## 2022-02-02 DIAGNOSIS — J432 Centrilobular emphysema: Secondary | ICD-10-CM

## 2022-02-02 MED ORDER — CYANOCOBALAMIN 1000 MCG/ML IJ SOLN
1000.0000 ug | Freq: Once | INTRAMUSCULAR | Status: AC
  Administered 2022-02-02: 1000 ug via INTRAMUSCULAR

## 2022-02-02 NOTE — Progress Notes (Addendum)
Martin Santiago is a 71 y.o. male  presents to the office today for a B12 injection, per physician's orders. ?Original order: per Mackie Pai, PA-C ? ?cyanocobalamin, 1000 mg/ml IM was administered in the left deltoid today. Monthly injection 4/5. ? ?Patient tolerated injection well.  ? ?Next appointment: Will call to schedule. ? ?Pt also given Prevnar-20, vaccine given in left deltoid. Pt tolerated well. ? ?Mackie Pai, PA-C  ?

## 2022-02-23 DIAGNOSIS — Z961 Presence of intraocular lens: Secondary | ICD-10-CM | POA: Diagnosis not present

## 2022-02-23 DIAGNOSIS — H35371 Puckering of macula, right eye: Secondary | ICD-10-CM | POA: Diagnosis not present

## 2022-02-23 DIAGNOSIS — Z8669 Personal history of other diseases of the nervous system and sense organs: Secondary | ICD-10-CM | POA: Diagnosis not present

## 2022-02-23 DIAGNOSIS — Z9889 Other specified postprocedural states: Secondary | ICD-10-CM | POA: Diagnosis not present

## 2022-02-23 DIAGNOSIS — H2702 Aphakia, left eye: Secondary | ICD-10-CM | POA: Diagnosis not present

## 2022-02-23 DIAGNOSIS — H40001 Preglaucoma, unspecified, right eye: Secondary | ICD-10-CM | POA: Diagnosis not present

## 2022-02-23 DIAGNOSIS — H3321 Serous retinal detachment, right eye: Secondary | ICD-10-CM | POA: Diagnosis not present

## 2022-02-23 DIAGNOSIS — Z48810 Encounter for surgical aftercare following surgery on the sense organs: Secondary | ICD-10-CM | POA: Diagnosis not present

## 2022-03-02 ENCOUNTER — Ambulatory Visit (INDEPENDENT_AMBULATORY_CARE_PROVIDER_SITE_OTHER): Payer: Medicare Other

## 2022-03-02 DIAGNOSIS — E538 Deficiency of other specified B group vitamins: Secondary | ICD-10-CM | POA: Diagnosis not present

## 2022-03-02 MED ORDER — CYANOCOBALAMIN 1000 MCG/ML IJ SOLN
1000.0000 ug | Freq: Once | INTRAMUSCULAR | Status: AC
  Administered 2022-03-02: 1000 ug via INTRAMUSCULAR

## 2022-03-02 NOTE — Progress Notes (Addendum)
Martin Santiago is a 71 y.o. male presents to the office today for a B12 injection, per physician's orders. Original order: per Mackie Pai, PA-C  cyanocobalamin, 1000 mg/ml IM was administered in the left deltoid today. Monthly injection 4/5.  Patient tolerated injection well.   Mackie Pai, PA-C

## 2022-04-19 ENCOUNTER — Ambulatory Visit (INDEPENDENT_AMBULATORY_CARE_PROVIDER_SITE_OTHER): Payer: Medicare Other | Admitting: Pulmonary Disease

## 2022-04-19 ENCOUNTER — Encounter: Payer: Self-pay | Admitting: Pulmonary Disease

## 2022-04-19 VITALS — BP 130/80 | HR 68 | Temp 98.0°F | Ht 66.0 in | Wt 127.0 lb

## 2022-04-19 DIAGNOSIS — J432 Centrilobular emphysema: Secondary | ICD-10-CM

## 2022-04-19 LAB — PULMONARY FUNCTION TEST
DL/VA % pred: 37 %
DL/VA: 1.53 ml/min/mmHg/L
DLCO cor % pred: 34 %
DLCO cor: 7.75 ml/min/mmHg
DLCO unc % pred: 34 %
DLCO unc: 7.75 ml/min/mmHg
FEF 25-75 Post: 0.51 L/sec
FEF 25-75 Pre: 0.47 L/sec
FEF2575-%Change-Post: 9 %
FEF2575-%Pred-Post: 25 %
FEF2575-%Pred-Pre: 23 %
FEV1-%Change-Post: 6 %
FEV1-%Pred-Post: 39 %
FEV1-%Pred-Pre: 37 %
FEV1-Post: 1.07 L
FEV1-Pre: 1.01 L
FEV1FVC-%Change-Post: 0 %
FEV1FVC-%Pred-Pre: 53 %
FEV6-%Change-Post: 3 %
FEV6-%Pred-Post: 76 %
FEV6-%Pred-Pre: 74 %
FEV6-Post: 2.66 L
FEV6-Pre: 2.56 L
FEV6FVC-%Change-Post: -2 %
FEV6FVC-%Pred-Post: 103 %
FEV6FVC-%Pred-Pre: 106 %
FVC-%Change-Post: 6 %
FVC-%Pred-Post: 74 %
FVC-%Pred-Pre: 69 %
FVC-Post: 2.74 L
FVC-Pre: 2.58 L
Post FEV1/FVC ratio: 39 %
Post FEV6/FVC ratio: 97 %
Pre FEV1/FVC ratio: 39 %
Pre FEV6/FVC Ratio: 100 %
RV % pred: 196 %
RV: 4.47 L
TLC % pred: 127 %
TLC: 7.91 L

## 2022-04-19 MED ORDER — ALBUTEROL SULFATE (2.5 MG/3ML) 0.083% IN NEBU: 2.5000 mg | INHALATION_SOLUTION | RESPIRATORY_TRACT | 11 refills | Status: DC | PRN

## 2022-04-19 MED ORDER — PREDNISONE 10 MG PO TABS: 20.0000 mg | ORAL_TABLET | Freq: Every day | ORAL | 0 refills | Status: DC

## 2022-04-19 MED ORDER — ALBUTEROL SULFATE HFA 108 (90 BASE) MCG/ACT IN AERS: 2.0000 | INHALATION_SPRAY | Freq: Four times a day (QID) | RESPIRATORY_TRACT | 6 refills | Status: DC | PRN

## 2022-04-19 NOTE — Progress Notes (Signed)
Full PFT performed today. °

## 2022-04-19 NOTE — Progress Notes (Signed)
Synopsis: Referred in September 2022 for COPD  Subjective:   PATIENT ID: Martin Santiago GENDER: male DOB: 1951/02/14, MRN: [id number]  HPI  Chief Complaint  Patient presents with   Follow-up    Follow-up PFT, SOB, cough, wheezing   [name] is a 71 year old male, daily smoker with hypertension and emphysema who returns to pulmonary clinic for COPD follow up.   PFTs today show severe obstruction, air trapping and severe diffusion defect.  He is now riding a motorized scooter around the grocery store due to dyspnea.   His wife passed away in 2022-12-10, he continues to grieve her loss. He is considering some travel but is concerned about traveling with a nebulizer machine.  OV 09/24/21 Overnight oximitry showed he spent 7 hours and 17 minutes with an SpO2 less than 88%. Patient was offered supplemental oxygen but declined at that time. He was started on breztri inhaler at last visit with good improvement in his shortness of breath. He is also using albuterol nebulizer twice daily with relief. He reports he is sleeping better since starting inhaler therapy.   OV 06/25/21 He has had increasing shortness of breath, cough and wheezing over recent months.  He has been on Bevespi inhaler over the last few years with good effect but he has been having more frequent breakthrough shortness of breath and wheezing recently.  He has an as needed albuterol inhaler which he uses but does not find much relief with this inhaler.  He does not have a nebulizer machine at home.  His cough is productive with sputum production on a daily basis.  He continues to smoke daily and has smoked over the past 45 years.  He has a 60+ pack year smoking history.  High-resolution CT scan from 12-10-15 shows extensive centrilobular emphysematous changes.  He has been seeing an ophthalmologist for retinal detachment but does not recall being treated for glaucoma.  Under his problem list there is listed secondary open-angle  glaucoma of the left eye.  Past Medical History:  Diagnosis Date   Colon polyps    COPD (chronic obstructive pulmonary disease) (HCC)    Crohn's disease ([place])    Detached retina    Essential hypertension    Tropical sprue      Family History  Problem Relation Age of Onset   Colon cancer Maternal Aunt    Breast cancer Mother      Social History   Socioeconomic History   Marital status: Widowed    Spouse name: Not on file   Number of children: 0   Years of education: Not on file   Highest education level: Not on file  Occupational History   Occupation: retired, Personal assistant, church organist    Employer: SELF EMPLOYED  Tobacco Use   Smoking status: Every Day    Packs/day: 1.50    Years: 45.00    Total pack years: 67.50    Types: Cigarettes   Smokeless tobacco: Never   Tobacco comments:    Smoke 1/4 pack a day. Tay 04/19/22  Substance and Sexual Activity   Alcohol use: Yes    Alcohol/week: 0.0 standard drinks of alcohol    Comment: Rare, 3-4 per yr   Drug use: No    Comment: Remote marijuana   Sexual activity: Not Currently  Other Topics Concern   Not on file  Social History Narrative   Wife, Manuela Schwartz passed away in 2021-12-09 from [place].   1 step-daughter who lives in  [place].   Social Determinants of Health   Financial Resource Strain: Low Risk  (01/06/2022)   Overall Financial Resource Strain (CARDIA)    Difficulty of Paying Living Expenses: Not hard at all  Food Insecurity: No Food Insecurity (01/06/2022)   Hunger Vital Sign    Worried About Running Out of Food in the Last Year: Never true    Ran Out of Food in the Last Year: Never true  Transportation Needs: No Transportation Needs (01/06/2022)   PRAPARE - Hydrologist (Medical): No    Lack of Transportation (Non-Medical): No  Physical Activity: Inactive (01/06/2022)   Exercise Vital Sign    Days of Exercise per Week: 0 days    Minutes of Exercise per Session: 0 min   Stress: No Stress Concern Present (01/06/2022)   [place]    Feeling of Stress : Only a little  Social Connections: Moderately Isolated (01/06/2022)   Social Connection and Isolation Panel [NHANES]    Frequency of Communication with Friends and Family: More than three times a week    Frequency of Social Gatherings with Friends and Family: More than three times a week    Attends Religious Services: 1 to 4 times per year    Active Member of [employer] or Organizations: No    Attends Archivist Meetings: Never    Marital Status: Widowed  Intimate Partner Violence: Not At Risk (01/06/2022)   Humiliation, Afraid, Rape, and Kick questionnaire    Fear of Current or Ex-Partner: No    Emotionally Abused: No    Physically Abused: No    Sexually Abused: No     Allergies  Allergen Reactions   Codeine Nausea And Vomiting and Other (See Comments)    Other reaction(s): Flushing (ALLERGY/intolerance) fainting     Outpatient Medications Prior to Visit  Medication Sig Dispense Refill   BREZTRI AEROSPHERE 160-9-4.8 MCG/ACT AERO INHALE 2 PUFFS INTO THE LUNGS IN THE MORNING AND AT BEDTIME 10.7 g 5   albuterol (PROVENTIL) (2.5 MG/3ML) 0.083% nebulizer solution Take 3 mLs (2.5 mg total) by nebulization in the morning and at bedtime. 180 mL 11   amLODipine (NORVASC) 10 MG tablet Take 1 tablet (10 mg total) by mouth daily. Reported on 02/25/2016 90 tablet 3   COVID-19 mRNA bivalent vaccine, Moderna, (MODERNA COVID-19 BIVAL BOOSTER) 50 MCG/0.5ML injection Inject into the muscle. 0.5 mL 0   FEROSUL 325 (65 Fe) MG tablet TAKE 1 TABLET BY MOUTH TWICE DAILY 60 tablet 1   losartan (COZAAR) 100 MG tablet Take 1 tablet (100 mg total) by mouth daily. Reported on 02/25/2016 90 tablet 3   omeprazole (PRILOSEC) 20 MG capsule Take 1 capsule (20 mg total) by mouth daily. 30 capsule 11   timolol (TIMOPTIC) 0.5 % ophthalmic solution Place 1 drop into the  right eye 2 times daily.     meloxicam (MOBIC) 15 MG tablet      VENTOLIN HFA 108 (90 Base) MCG/ACT inhaler      No facility-administered medications prior to visit.    Review of Systems  Constitutional:  Negative for chills, fever, malaise/fatigue and weight loss.  HENT:  Negative for congestion, sinus pain and sore throat.   Eyes: Negative.   Respiratory:  Positive for cough, sputum production, shortness of breath and wheezing. Negative for hemoptysis.   Cardiovascular:  Negative for chest pain, palpitations, orthopnea, claudication and leg swelling.  Gastrointestinal:  Negative for abdominal pain,  heartburn, nausea and vomiting.  Genitourinary: Negative.   Musculoskeletal:  Negative for joint pain and myalgias.  Skin:  Negative for rash.  Neurological:  Negative for weakness.  Endo/Heme/Allergies: Negative.   Psychiatric/Behavioral: Negative.     Objective:   Vitals:   04/19/22 1330  BP: 130/80  Pulse: 68  Temp: 98 F (36.7 C)  TempSrc: Oral  SpO2: 94%  Weight: 127 lb (57.6 kg)  Height: '5\' 6"'$  (1.676 m)     Physical Exam Constitutional:      General: He is not in acute distress. HENT:     Head: Normocephalic and atraumatic.  Eyes:     Conjunctiva/sclera: Conjunctivae normal.  Cardiovascular:     Rate and Rhythm: Normal rate and regular rhythm.     Pulses: Normal pulses.     Heart sounds: Normal heart sounds. No murmur heard. Pulmonary:     Effort: Pulmonary effort is normal.     Breath sounds: Decreased air movement present. Wheezing (anterior lung fields) present. No rhonchi or rales.  Musculoskeletal:     Right lower leg: No edema.     Left lower leg: No edema.  Skin:    General: Skin is warm and dry.  Neurological:     General: No focal deficit present.     Mental Status: He is alert.  Psychiatric:        Mood and Affect: Mood normal.        Behavior: Behavior normal.        Thought Content: Thought content normal.        Judgment: Judgment normal.     CBC    Component Value Date/Time   WBC 5.8 10/01/2021 1323   RBC 5.27 10/01/2021 1323   HGB 12.3 (L) 10/01/2021 1323   HCT 40.2 10/01/2021 1323   PLT 236.0 10/01/2021 1323   MCV 76.4 (L) 10/01/2021 1323   MCH 31.1 06/14/2017 0705   MCHC 30.7 10/01/2021 1323   RDW 31.7 (H) 10/01/2021 1323   LYMPHSABS 0.7 10/01/2021 1323   MONOABS 0.5 10/01/2021 1323   EOSABS 0.1 10/01/2021 1323   BASOSABS 0.1 10/01/2021 1323   Chest imaging: HRCT Chest 2017 1. Moderate to severe centrilobular emphysema. No evidence of interstitial lung disease. 2. Basilar subpleural nodules are likely subpleural lymph nodes.  PFT:    Latest Ref Rng & Units 04/19/2022   11:51 AM  PFT Results  FVC-Pre L 2.58  P  FVC-Predicted Pre % 69  P  FVC-Post L 2.74  P  FVC-Predicted Post % 74  P  Pre FEV1/FVC % % 39  P  Post FEV1/FCV % % 39  P  FEV1-Pre L 1.01  P  FEV1-Predicted Pre % 37  P  FEV1-Post L 1.07  P  DLCO uncorrected ml/min/mmHg 7.75  P  DLCO UNC% % 34  P  DLCO corrected ml/min/mmHg 7.75  P  DLCO COR %Predicted % 34  P  DLVA Predicted % 37  P  TLC L 7.91  P  TLC % Predicted % 127  P  RV % Predicted % 196  P    P Preliminary result    Assessment & Plan:   Centrilobular emphysema (HCC) - Plan: albuterol (PROVENTIL) (2.5 MG/3ML) 0.083% nebulizer solution, predniSONE (DELTASONE) 10 MG tablet, albuterol (VENTOLIN HFA) 108 (90 Base) MCG/ACT inhaler  Discussion: Martin Santiago is a 71 year old male, daily smoker with hypertension and emphysema who returns to pulmonary clinic for COPD follow up.   He is to  continue breztri inhaler and albuterol nebulizer treatments. I have sent in albuterol inhaler prescription for him to try in preparation for future travels.  He does have wheezing on exam today along with increased dyspnea symptoms. We will treat with 5 days of prednisone '40mg'$  daily.   Follow-up in 6 months with pulmonary function tests.  Freda Jackson, MD [facility] Office: [phone]   Current Outpatient Medications:    albuterol (VENTOLIN HFA) 108 (90 Base) MCG/ACT inhaler, Inhale 2 puffs into the lungs every 6 (six) hours as needed for wheezing or shortness of breath., Disp: 8 g, Rfl: 6   BREZTRI AEROSPHERE 160-9-4.8 MCG/ACT AERO, INHALE 2 PUFFS INTO THE LUNGS IN THE MORNING AND AT BEDTIME, Disp: 10.7 g, Rfl: 5   predniSONE (DELTASONE) 10 MG tablet, Take 2 tablets (20 mg total) by mouth daily with breakfast., Disp: 10 tablet, Rfl: 0   albuterol (PROVENTIL) (2.5 MG/3ML) 0.083% nebulizer solution, Take 3 mLs (2.5 mg total) by nebulization every 4 (four) hours as needed for wheezing or shortness of breath., Disp: 540 mL, Rfl: 11   amLODipine (NORVASC) 10 MG tablet, Take 1 tablet (10 mg total) by mouth daily. Reported on 02/25/2016, Disp: 90 tablet, Rfl: 3   COVID-19 mRNA bivalent vaccine, Moderna, (MODERNA COVID-19 BIVAL BOOSTER) 50 MCG/0.5ML injection, Inject into the muscle., Disp: 0.5 mL, Rfl: 0   FEROSUL 325 (65 Fe) MG tablet, TAKE 1 TABLET BY MOUTH TWICE DAILY, Disp: 60 tablet, Rfl: 1   losartan (COZAAR) 100 MG tablet, Take 1 tablet (100 mg total) by mouth daily. Reported on 02/25/2016, Disp: 90 tablet, Rfl: 3   omeprazole (PRILOSEC) 20 MG capsule, Take 1 capsule (20 mg total) by mouth daily., Disp: 30 capsule, Rfl: 11   timolol (TIMOPTIC) 0.5 % ophthalmic solution, Place 1 drop into the right eye 2 times daily., Disp: , Rfl:

## 2022-04-19 NOTE — Patient Instructions (Signed)
Continue breztri inhaler 2 puffs twice daily   Continue to use albuterol nebulizer treatments every 4-6 hours as needed   Use albuterol inhaler 1-2 puffs every 4-6 hours as needed when away from the nebulizer treatment.   Start prednisone '40mg'$  daily for 5 days to help with the wheezing  Follow up in 6 months

## 2022-04-19 NOTE — Patient Instructions (Signed)
Full PFT performed today. °

## 2022-04-26 ENCOUNTER — Ambulatory Visit (INDEPENDENT_AMBULATORY_CARE_PROVIDER_SITE_OTHER): Payer: Medicare Other

## 2022-04-26 DIAGNOSIS — E538 Deficiency of other specified B group vitamins: Secondary | ICD-10-CM | POA: Diagnosis not present

## 2022-04-26 MED ORDER — CYANOCOBALAMIN 1000 MCG/ML IJ SOLN
1000.0000 ug | Freq: Once | INTRAMUSCULAR | Status: AC
  Administered 2022-04-26: 1000 ug via INTRAMUSCULAR

## 2022-04-26 NOTE — Progress Notes (Addendum)
Martin Santiago is a 71 y.o. male presents to the office today for a B12 injection, per physician's orders. Original order: per Mackie Pai, PA-C  cyanocobalamin, 1000 mg/ml IM was administered in the left deltoid today. Monthly injection.  Patient tolerated injection well.   Mackie Pai, PA-C

## 2022-06-02 ENCOUNTER — Ambulatory Visit (INDEPENDENT_AMBULATORY_CARE_PROVIDER_SITE_OTHER): Payer: Medicare Other

## 2022-06-02 ENCOUNTER — Encounter: Payer: Self-pay | Admitting: Medical

## 2022-06-02 DIAGNOSIS — E538 Deficiency of other specified B group vitamins: Secondary | ICD-10-CM

## 2022-06-02 DIAGNOSIS — Z23 Encounter for immunization: Secondary | ICD-10-CM

## 2022-06-02 MED ORDER — CYANOCOBALAMIN 1000 MCG/ML IJ SOLN
1000.0000 ug | Freq: Once | INTRAMUSCULAR | Status: AC
  Administered 2022-06-02: 1000 ug via INTRAMUSCULAR

## 2022-06-02 NOTE — Progress Notes (Signed)
Martin Santiago is a 71 y.o. male presents to the office today for a B12 injection, per physician's orders.  Original order: per Mackie Pai, PA-C  cyanocobalamin, 1000 mg/ml IM was administered in the left deltoid today. Monthly injection.   Patient tolerated injection well.    Mackie Pai, PA-C   Pt also requested the HD flu and that was given on the right Deltoid.

## 2022-06-30 DIAGNOSIS — Z23 Encounter for immunization: Secondary | ICD-10-CM | POA: Diagnosis not present

## 2022-07-01 ENCOUNTER — Ambulatory Visit (INDEPENDENT_AMBULATORY_CARE_PROVIDER_SITE_OTHER): Payer: Medicare Other | Admitting: *Deleted

## 2022-07-01 DIAGNOSIS — E538 Deficiency of other specified B group vitamins: Secondary | ICD-10-CM

## 2022-07-01 MED ORDER — CYANOCOBALAMIN 1000 MCG/ML IJ SOLN
1000.0000 ug | Freq: Once | INTRAMUSCULAR | Status: AC
  Administered 2022-07-01: 1000 ug via INTRAMUSCULAR

## 2022-07-01 NOTE — Progress Notes (Addendum)
Martin Santiago is a 72 y.o. male presents to the office today for a B12 injection, per physician's orders.  Original order: per Mackie Pai, PA-C  cyanocobalamin, 1000 mg/ml IM was administered in the right deltoid today. Monthly injection.   Patient tolerated injection well.   Patient next b12 injection scheduled for 07/29/22.  Mackie Pai, PA-C

## 2022-07-29 ENCOUNTER — Ambulatory Visit: Payer: Medicare Other

## 2022-08-05 ENCOUNTER — Ambulatory Visit (INDEPENDENT_AMBULATORY_CARE_PROVIDER_SITE_OTHER): Payer: Medicare Other

## 2022-08-05 DIAGNOSIS — E538 Deficiency of other specified B group vitamins: Secondary | ICD-10-CM

## 2022-08-05 MED ORDER — CYANOCOBALAMIN 1000 MCG/ML IJ SOLN
1000.0000 ug | Freq: Once | INTRAMUSCULAR | Status: AC
  Administered 2022-08-05: 1000 ug via INTRAMUSCULAR

## 2022-08-05 NOTE — Progress Notes (Addendum)
Martin Santiago is a 71 y.o. male presents to the office today for Monthly B12: injection, per physician's orders. Original order: 09/2021: B12 deficiency. Continue b12 injections.  Cyanocobalamin 1000 mg  (route) was administered R Deltoid today.Patient tolerated injection. Patient due for follow up labs/provider appt: Yes. Date due: 1 month- Follow up and B12, appt made Yes Patient next injection due: 1 month, appt made Yes  Creft, Margie Ege, PA-C

## 2022-08-27 ENCOUNTER — Other Ambulatory Visit: Payer: Self-pay | Admitting: Pulmonary Disease

## 2022-08-27 DIAGNOSIS — J432 Centrilobular emphysema: Secondary | ICD-10-CM

## 2022-08-29 ENCOUNTER — Telehealth: Payer: Self-pay | Admitting: Medical

## 2022-08-29 DIAGNOSIS — I10 Essential (primary) hypertension: Secondary | ICD-10-CM

## 2022-08-29 DIAGNOSIS — E538 Deficiency of other specified B group vitamins: Secondary | ICD-10-CM

## 2022-08-29 DIAGNOSIS — D649 Anemia, unspecified: Secondary | ICD-10-CM

## 2022-08-29 NOTE — Telephone Encounter (Signed)
Pt called stating that he would like to have any lab work done prior to his appt on 12.11.23 in order to go over results in that appt. Please Advise.

## 2022-08-30 ENCOUNTER — Other Ambulatory Visit (INDEPENDENT_AMBULATORY_CARE_PROVIDER_SITE_OTHER): Payer: Medicare Other

## 2022-08-30 DIAGNOSIS — D649 Anemia, unspecified: Secondary | ICD-10-CM | POA: Diagnosis not present

## 2022-08-30 DIAGNOSIS — I10 Essential (primary) hypertension: Secondary | ICD-10-CM

## 2022-08-30 DIAGNOSIS — E538 Deficiency of other specified B group vitamins: Secondary | ICD-10-CM | POA: Diagnosis not present

## 2022-08-30 LAB — COMPREHENSIVE METABOLIC PANEL
ALT: 7 U/L (ref 0–53)
AST: 11 U/L (ref 0–37)
Albumin: 4 g/dL (ref 3.5–5.2)
Alkaline Phosphatase: 80 U/L (ref 39–117)
BUN: 15 mg/dL (ref 6–23)
CO2: 29 mEq/L (ref 19–32)
Calcium: 10.1 mg/dL (ref 8.4–10.5)
Chloride: 103 mEq/L (ref 96–112)
Creatinine, Ser: 0.98 mg/dL (ref 0.40–1.50)
GFR: 76.77 mL/min (ref >60.00)
Glucose, Bld: 89 mg/dL (ref 70–99)
Potassium: 4.4 mEq/L (ref 3.5–5.1)
Sodium: 140 mEq/L (ref 135–145)
Total Bilirubin: 0.6 mg/dL (ref 0.2–1.2)
Total Protein: 6.7 g/dL (ref 6.0–8.3)

## 2022-08-30 LAB — CBC WITH DIFFERENTIAL/PLATELET
Basophils Absolute: 0.1 10*3/uL (ref 0.0–0.1)
Basophils Relative: 0.8 % (ref 0.0–3.0)
Eosinophils Absolute: 0.2 10*3/uL (ref 0.0–0.7)
Eosinophils Relative: 3.1 % (ref 0.0–5.0)
HCT: 45.2 % (ref 39.0–52.0)
Hemoglobin: 15.1 g/dL (ref 13.0–17.0)
Lymphocytes Relative: 16.7 % (ref 12.0–46.0)
Lymphs Abs: 1.1 10*3/uL (ref 0.7–4.0)
MCHC: 33.3 g/dL (ref 30.0–36.0)
MCV: 87.3 fl (ref 78.0–100.0)
Monocytes Absolute: 0.8 10*3/uL (ref 0.1–1.0)
Monocytes Relative: 12.4 % — ABNORMAL HIGH (ref 3.0–12.0)
Neutro Abs: 4.5 10*3/uL (ref 1.4–7.7)
Neutrophils Relative %: 67 % (ref 43.0–77.0)
Platelets: 254 10*3/uL (ref 150.0–400.0)
RBC: 5.18 Mil/uL (ref 4.22–5.81)
RDW: 14.8 % (ref 11.5–15.5)
WBC: 6.8 10*3/uL (ref 4.0–10.5)

## 2022-08-30 LAB — VITAMIN B12: Vitamin B-12: 355 pg/mL (ref 211–911)

## 2022-08-30 LAB — IRON: Iron: 57 ug/dL (ref 42–165)

## 2022-08-30 LAB — LIPID PANEL
Cholesterol: 180 mg/dL (ref 0–200)
HDL: 54.8 mg/dL (ref >39.00)
LDL Cholesterol: 110 mg/dL — ABNORMAL HIGH (ref 0–99)
NonHDL: 125.17
Total CHOL/HDL Ratio: 3
Triglycerides: 76 mg/dL (ref 0.0–149.0)
VLDL: 15.2 mg/dL (ref 0.0–40.0)

## 2022-08-30 NOTE — Telephone Encounter (Signed)
Patient scheduled for labs today at 945am

## 2022-09-05 ENCOUNTER — Ambulatory Visit (INDEPENDENT_AMBULATORY_CARE_PROVIDER_SITE_OTHER): Payer: Medicare Other | Admitting: Medical

## 2022-09-05 ENCOUNTER — Encounter: Payer: Self-pay | Admitting: Medical

## 2022-09-05 VITALS — BP 125/70 | HR 74 | Resp 18 | Ht 66.0 in | Wt 129.0 lb

## 2022-09-05 DIAGNOSIS — D649 Anemia, unspecified: Secondary | ICD-10-CM | POA: Diagnosis not present

## 2022-09-05 DIAGNOSIS — I2510 Atherosclerotic heart disease of native coronary artery without angina pectoris: Secondary | ICD-10-CM

## 2022-09-05 DIAGNOSIS — J449 Chronic obstructive pulmonary disease, unspecified: Secondary | ICD-10-CM

## 2022-09-05 DIAGNOSIS — I10 Essential (primary) hypertension: Secondary | ICD-10-CM

## 2022-09-05 DIAGNOSIS — E538 Deficiency of other specified B group vitamins: Secondary | ICD-10-CM

## 2022-09-05 MED ORDER — SILDENAFIL CITRATE 20 MG PO TABS: ORAL_TABLET | ORAL | 0 refills | Status: DC

## 2022-09-05 MED ORDER — CYANOCOBALAMIN 1000 MCG/ML IJ SOLN
1000.0000 ug | Freq: Once | INTRAMUSCULAR | Status: AC
  Administered 2022-09-05: 1000 ug via INTRAMUSCULAR

## 2022-09-05 NOTE — Progress Notes (Signed)
Subjective:    Patient ID: Martin Santiago, male    DOB: 03-03-1951, 71 y.o.   MRN: [id number]  HPI  Pt in for follow up.  Last visit follow up with me was.  "Hx of anemia with low iron and recent + ifob. Place cbc and iron test today. Will order stat as we are approaching the weekend. Already placed gi referral. If hb/hct dropping will try to expidite the gi referral. Also if significant drop as weekend approaches may advise ED evaluation.   B12 deficiency. Continue b12 injections.   Htn- continue losartan and amlodipine.   Jerrye Bushy- continue omeprazole.   For copd continue inhalers and consider the recommendation for 02 use per pulmonologist recommendations.   Some potential restless leg per your description. Continue your conservative measures. Gabapentin low dose at night other option if you decide you want to try."   Pt prior anemia has improved and is now normal. Pt had positive stool card in past. I had referred to GI MD and he admits he refused. Pt took iron and anemia resolve. Now just take sporadically.  Recent iron level was normal.   Low b12 in past.  Now in normal range after 5-6 monthy injections. Pt has hx of chron's. Used to be on montly injections.  Htn- bp better controlled on recheck.  On losartan and amlodipine.  For copd on brezrtri.   For ED did rx sildenafil. Rx advisement given.    Review of Systems  Constitutional:  Negative for chills, diaphoresis and fatigue.  HENT:  Negative for congestion and dental problem.   Respiratory:  Negative for cough, chest tightness and shortness of breath.   Cardiovascular:  Negative for palpitations.  Gastrointestinal:  Negative for abdominal pain, blood in stool, constipation, diarrhea and nausea.  Genitourinary:  Negative for dysuria.  Musculoskeletal:  Negative for back pain and myalgias.  Neurological:  Negative for dizziness, speech difficulty, weakness, numbness and headaches.  Hematological:  Negative for  adenopathy. Does not bruise/bleed easily.  Psychiatric/Behavioral:  Negative for behavioral problems, decreased concentration and dysphoric mood.     Past Medical History:  Diagnosis Date   Colon polyps    COPD (chronic obstructive pulmonary disease) ([place])    Crohn's disease ([place])    Detached retina    Essential hypertension    Tropical sprue      Social History   Socioeconomic History   Marital status: Widowed    Spouse name: Not on file   Number of children: 0   Years of education: Not on file   Highest education level: Not on file  Occupational History   Occupation: retired, Personal assistant, church organist    Employer: SELF EMPLOYED  Tobacco Use   Smoking status: Every Day    Packs/day: 1.50    Years: 45.00    Total pack years: 67.50    Types: Cigarettes   Smokeless tobacco: Never   Tobacco comments:    Smoke 1/4 pack a day. Tay 04/19/22  Substance and Sexual Activity   Alcohol use: Yes    Alcohol/week: 0.0 standard drinks of alcohol    Comment: Rare, 3-4 per yr   Drug use: No    Comment: Remote marijuana   Sexual activity: Not Currently  Other Topics Concern   Not on file  Social History Narrative   Wife, Manuela Schwartz passed away in 12/28/2021 from [place].   1 step-daughter who lives in [place].   Social Determinants of Radio broadcast assistant  Strain: Low Risk  (01/06/2022)   Overall Financial Resource Strain (CARDIA)    Difficulty of Paying Living Expenses: Not hard at all  Food Insecurity: No Food Insecurity (01/06/2022)   Hunger Vital Sign    Worried About Running Out of Food in the Last Year: Never true    Ran Out of Food in the Last Year: Never true  Transportation Needs: No Transportation Needs (01/06/2022)   PRAPARE - Hydrologist (Medical): No    Lack of Transportation (Non-Medical): No  Physical Activity: Inactive (01/06/2022)   Exercise Vital Sign    Days of Exercise per Week: 0 days    Minutes of Exercise per  Session: 0 min  Stress: No Stress Concern Present (01/06/2022)   [place]    Feeling of Stress : Only a little  Social Connections: Moderately Isolated (01/06/2022)   Social Connection and Isolation Panel [NHANES]    Frequency of Communication with Friends and Family: More than three times a week    Frequency of Social Gatherings with Friends and Family: More than three times a week    Attends Religious Services: 1 to 4 times per year    Active Member of [employer] or Organizations: No    Attends Archivist Meetings: Never    Marital Status: Widowed  Intimate Partner Violence: Not At Risk (01/06/2022)   Humiliation, Afraid, Rape, and Kick questionnaire    Fear of Current or Ex-Partner: No    Emotionally Abused: No    Physically Abused: No    Sexually Abused: No    Past Surgical History:  Procedure Laterality Date   BOWEL RESECTION  05/1994   Mikel Cella:  ? Crohn's   COLONOSCOPY     Dr Derrill Center, [facility]:  Hx polyps. Cannot remmeber date   COLONOSCOPY N/A 12/03/2012   Procedure: COLONOSCOPY;  Surgeon: Daneil Dolin, MD;  Location: [facility];  Service: Endoscopy;  Laterality: N/A;  7:30   INGUINAL HERNIA REPAIR Right    RETINAL DETACHMENT SURGERY  07/2006   RETINAL DETACHMENT SURGERY  09/2006   RETINAL DETACHMENT SURGERY  01/2007    Family History  Problem Relation Age of Onset   Colon cancer Maternal Aunt    Breast cancer Mother     Allergies  Allergen Reactions   Codeine Nausea And Vomiting and Other (See Comments)    Other reaction(s): Flushing (ALLERGY/intolerance) fainting    Current Outpatient Medications on File Prior to Visit  Medication Sig Dispense Refill   albuterol (PROVENTIL) (2.5 MG/3ML) 0.083% nebulizer solution Take 3 mLs (2.5 mg total) by nebulization every 4 (four) hours as needed for wheezing or shortness of breath. 540 mL 11   albuterol (VENTOLIN HFA) 108 (90 Base) MCG/ACT inhaler  Inhale 2 puffs into the lungs every 6 (six) hours as needed for wheezing or shortness of breath. 8 g 6   amLODipine (NORVASC) 10 MG tablet Take 1 tablet (10 mg total) by mouth daily. Reported on 02/25/2016 90 tablet 3   BREZTRI AEROSPHERE 160-9-4.8 MCG/ACT AERO INHALE 2 PUFFS INTO THE LUNGS IN THE MORNING AND AT BEDTIME 10.7 g 5   COVID-19 mRNA bivalent vaccine, Moderna, (MODERNA COVID-19 BIVAL BOOSTER) 50 MCG/0.5ML injection Inject into the muscle. 0.5 mL 0   FEROSUL 325 (65 Fe) MG tablet TAKE 1 TABLET BY MOUTH TWICE DAILY 60 tablet 1   losartan (COZAAR) 100 MG tablet Take 1 tablet (100 mg total) by mouth  daily. Reported on 02/25/2016 90 tablet 3   omeprazole (PRILOSEC) 20 MG capsule Take 1 capsule (20 mg total) by mouth daily. 30 capsule 11   timolol (TIMOPTIC) 0.5 % ophthalmic solution Place 1 drop into the right eye 2 times daily.     predniSONE (DELTASONE) 10 MG tablet Take 2 tablets (20 mg total) by mouth daily with breakfast. 10 tablet 0   No current facility-administered medications on file prior to visit.    BP 125/70   Pulse 74   Resp 18   Ht '5\' 6"'$  (1.676 m)   Wt 129 lb (58.5 kg)   SpO2 90%   BMI 20.82 kg/m        Objective:   Physical Exam   General Mental Status- Alert. General Appearance- Not in acute distress.   Skin General: Color- Normal Color. Moisture- Normal Moisture.  Neck Carotid Arteries- Normal color. Moisture- Normal Moisture. No carotid bruits. No JVD.  Chest and Lung Exam Auscultation: Breath Sounds:-Normal.  Cardiovascular Auscultation:Rythm- Regular. Murmurs & Other Heart Sounds:Auscultation of the heart reveals- No Murmurs.  Abdomen Inspection:-Inspeection Normal. Palpation/Percussion:Note:No mass. Palpation and Percussion of the abdomen reveal- Non Tender, Non Distended + BS, no rebound or guarding.   Neurologic Cranial Nerve exam:- CN III-XII intact(No nystagmus), symmetric smile. Strength:- 5/5 equal and symmetric strength both upper and  lower extremities.      Assessment & Plan:   Patient Instructions  Anemia improve/resolved since used iron. Both cbc and iron level were normal. Placed referral to GI MD as you did have + stool card in past but you declined. If you change your mind let me know.  B12 deficiency with normal range level now but hx of chrons. Continue monthly injection.   Htn- well controlled on losartan and amlodipine.  Copd well controlled with breztri. Use albuterol as back up if needed.  For ED rx sidenafil. Rx advisement.  Follow up in 6 months or sooner if needed.   Mackie Pai, PA-C

## 2022-09-05 NOTE — Patient Instructions (Addendum)
Anemia improve/resolved since used iron. Both cbc and iron level were normal. Placed referral to GI MD as you did have + stool card in past but you declined. If you change your mind let me know.  B12 deficiency with normal range level now but hx of chrons. Continue monthly injection. (Got injection today)  Htn- well controlled on losartan and amlodipine.  Copd well controlled with breztri. Use albuterol as back up if needed.  For ED rx sidenafil. Rx advisement.  Follow up in 6 months or sooner if needed.

## 2022-09-15 DIAGNOSIS — Z961 Presence of intraocular lens: Secondary | ICD-10-CM | POA: Diagnosis not present

## 2022-09-15 DIAGNOSIS — H3321 Serous retinal detachment, right eye: Secondary | ICD-10-CM | POA: Diagnosis not present

## 2022-09-15 DIAGNOSIS — H40001 Preglaucoma, unspecified, right eye: Secondary | ICD-10-CM | POA: Diagnosis not present

## 2022-09-28 ENCOUNTER — Telehealth: Payer: Self-pay | Admitting: Pulmonary Disease

## 2022-09-28 DIAGNOSIS — H40011 Open angle with borderline findings, low risk, right eye: Secondary | ICD-10-CM | POA: Diagnosis not present

## 2022-09-28 DIAGNOSIS — Z961 Presence of intraocular lens: Secondary | ICD-10-CM | POA: Diagnosis not present

## 2022-09-28 DIAGNOSIS — H52201 Unspecified astigmatism, right eye: Secondary | ICD-10-CM | POA: Diagnosis not present

## 2022-09-28 NOTE — Telephone Encounter (Signed)
Martin Santiago, from [facility] called. Would like appt notes for this PT i.e. his history of appts (time & date)  w/us, not necessarily what transpired. Thanks.   FAX @ [phone]

## 2022-09-28 NOTE — Telephone Encounter (Signed)
Printed off last two office notes and faxed to [place] with [facility] with Attn to [facility]. Nothing further needed

## 2022-10-06 ENCOUNTER — Ambulatory Visit (INDEPENDENT_AMBULATORY_CARE_PROVIDER_SITE_OTHER): Payer: Medicare Other

## 2022-10-06 DIAGNOSIS — E538 Deficiency of other specified B group vitamins: Secondary | ICD-10-CM | POA: Diagnosis not present

## 2022-10-06 MED ORDER — CYANOCOBALAMIN 1000 MCG/ML IJ SOLN
1000.0000 ug | Freq: Once | INTRAMUSCULAR | Status: AC
  Administered 2022-10-06: 1000 ug via INTRAMUSCULAR

## 2022-10-06 NOTE — Progress Notes (Addendum)
Martin Santiago is a 72 y.o. male presents to the office today for B12:  injections, per physician's orders. Original order: on office visit from 09/05/2022 to continue monthly B12 injections. Cyanocobalamin (med), 1000 mg/ml (dose),  IM (route) was administered Left deltoid (location) today. Patient tolerated injection.  Patient next injection due: in one month, appt made Yes for 11/08/22.   Jolene Provost Rennis Petty, PA-C

## 2022-10-24 ENCOUNTER — Ambulatory Visit (INDEPENDENT_AMBULATORY_CARE_PROVIDER_SITE_OTHER): Payer: Medicare Other | Admitting: Pulmonary Disease

## 2022-10-24 ENCOUNTER — Encounter: Payer: Self-pay | Admitting: Pulmonary Disease

## 2022-10-24 VITALS — BP 116/74 | HR 68 | Ht 66.0 in | Wt 125.8 lb

## 2022-10-24 DIAGNOSIS — J441 Chronic obstructive pulmonary disease with (acute) exacerbation: Secondary | ICD-10-CM

## 2022-10-24 DIAGNOSIS — F17210 Nicotine dependence, cigarettes, uncomplicated: Secondary | ICD-10-CM | POA: Diagnosis not present

## 2022-10-24 MED ORDER — AZITHROMYCIN 250 MG PO TABS: ORAL_TABLET | ORAL | 0 refills | Status: DC

## 2022-10-24 MED ORDER — PREDNISONE 10 MG PO TABS: ORAL_TABLET | ORAL | 0 refills | Status: AC

## 2022-10-24 NOTE — Progress Notes (Signed)
Synopsis: Referred in September 2022 for COPD  Subjective:   PATIENT ID: Martin Santiago GENDER: male DOB: June 16, 1951, MRN: [id number]  HPI  Chief Complaint  Patient presents with   Follow-up    6 mo f/u. States he has noticed that his SOB and fatigue over the past few months. Denied any increased coughing.    Martin Santiago is a 72 year old male, daily smoker with hypertension and emphysema who returns to pulmonary clinic for COPD follow up.   He continues to have dyspnea and decreased stamina. He has increased congestion over the past few days. He continues to smoke 4-6 cigarettes per day.   OV 04/19/22 PFTs today show severe obstruction, air trapping and severe diffusion defect.  He is now riding a motorized scooter around the grocery store due to dyspnea.   His wife passed away in 12-14-22, he continues to grieve her loss. He is considering some travel but is concerned about traveling with a nebulizer machine.  OV 09/24/21 Overnight oximitry showed he spent 7 hours and 17 minutes with an SpO2 less than 88%. Patient was offered supplemental oxygen but declined at that time. He was started on breztri inhaler at last visit with good improvement in his shortness of breath. He is also using albuterol nebulizer twice daily with relief. He reports he is sleeping better since starting inhaler therapy.   OV 06/25/21 He has had increasing shortness of breath, cough and wheezing over recent months.  He has been on Bevespi inhaler over the last few years with good effect but he has been having more frequent breakthrough shortness of breath and wheezing recently.  He has an as needed albuterol inhaler which he uses but does not find much relief with this inhaler.  He does not have a nebulizer machine at home.  His cough is productive with sputum production on a daily basis.  He continues to smoke daily and has smoked over the past 45 years.  He has a 60+ pack year smoking history.  High-resolution CT  scan from 2015-12-14 shows extensive centrilobular emphysematous changes.  He has been seeing an ophthalmologist for retinal detachment but does not recall being treated for glaucoma.  Under his problem list there is listed secondary open-angle glaucoma of the left eye.  Past Medical History:  Diagnosis Date   Colon polyps    COPD (chronic obstructive pulmonary disease) (HCC)    Crohn's disease ([place])    Detached retina    Essential hypertension    Tropical sprue      Family History  Problem Relation Age of Onset   Colon cancer Maternal Aunt    Breast cancer Mother      Social History   Socioeconomic History   Marital status: Widowed    Spouse name: Not on file   Number of children: 0   Years of education: Not on file   Highest education level: Not on file  Occupational History   Occupation: retired, Personal assistant, church organist    Employer: SELF EMPLOYED  Tobacco Use   Smoking status: Every Day    Packs/day: 1.50    Years: 45.00    Total pack years: 67.50    Types: Cigarettes   Smokeless tobacco: Never   Tobacco comments:    Smoke 1/4 pack a day. [place] 04/19/22  Substance and Sexual Activity   Alcohol use: Yes    Alcohol/week: 0.0 standard drinks of alcohol    Comment: Rare, 3-4 per yr   Drug  use: No    Comment: Remote marijuana   Sexual activity: Not Currently  Other Topics Concern   Not on file  Social History Narrative   Wife, Martin Santiago passed away in December 11, 2021 from [place].   1 step-daughter who lives in [place].   Social Determinants of Health   Financial Resource Strain: Low Risk  (01/06/2022)   Overall Financial Resource Strain (CARDIA)    Difficulty of Paying Living Expenses: Not hard at all  Food Insecurity: No Food Insecurity (01/06/2022)   Hunger Vital Sign    Worried About Running Out of Food in the Last Year: Never true    Ran Out of Food in the Last Year: Never true  Transportation Needs: No Transportation Needs (01/06/2022)   PRAPARE -  Hydrologist (Medical): No    Lack of Transportation (Non-Medical): No  Physical Activity: Inactive (01/06/2022)   Exercise Vital Sign    Days of Exercise per Week: 0 days    Minutes of Exercise per Session: 0 min  Stress: No Stress Concern Present (01/06/2022)   [place]    Feeling of Stress : Only a little  Social Connections: Moderately Isolated (01/06/2022)   Social Connection and Isolation Panel [NHANES]    Frequency of Communication with Friends and Family: More than three times a week    Frequency of Social Gatherings with Friends and Family: More than three times a week    Attends Religious Services: 1 to 4 times per year    Active Member of [employer] or Organizations: No    Attends Archivist Meetings: Never    Marital Status: Widowed  Intimate Partner Violence: Not At Risk (01/06/2022)   Humiliation, Afraid, Rape, and Kick questionnaire    Fear of Current or Ex-Partner: No    Emotionally Abused: No    Physically Abused: No    Sexually Abused: No     Allergies  Allergen Reactions   Codeine Nausea And Vomiting and Other (See Comments)    Other reaction(s): Flushing (ALLERGY/intolerance) fainting     Outpatient Medications Prior to Visit  Medication Sig Dispense Refill   albuterol (PROVENTIL) (2.5 MG/3ML) 0.083% nebulizer solution Take 3 mLs (2.5 mg total) by nebulization every 4 (four) hours as needed for wheezing or shortness of breath. 540 mL 11   albuterol (VENTOLIN HFA) 108 (90 Base) MCG/ACT inhaler Inhale 2 puffs into the lungs every 6 (six) hours as needed for wheezing or shortness of breath. 8 g 6   amLODipine (NORVASC) 10 MG tablet Take 1 tablet (10 mg total) by mouth daily. Reported on 02/25/2016 90 tablet 3   BREZTRI AEROSPHERE 160-9-4.8 MCG/ACT AERO INHALE 2 PUFFS INTO THE LUNGS IN THE MORNING AND AT BEDTIME 10.7 g 5   COVID-19 mRNA bivalent vaccine, Moderna,  (MODERNA COVID-19 BIVAL BOOSTER) 50 MCG/0.5ML injection Inject into the muscle. 0.5 mL 0   FEROSUL 325 (65 Fe) MG tablet TAKE 1 TABLET BY MOUTH TWICE DAILY 60 tablet 1   losartan (COZAAR) 100 MG tablet Take 1 tablet (100 mg total) by mouth daily. Reported on 02/25/2016 90 tablet 3   omeprazole (PRILOSEC) 20 MG capsule Take 1 capsule (20 mg total) by mouth daily. 30 capsule 11   sildenafil (REVATIO) 20 MG tablet 3-5 tab prior to sex.(Max use once in 24 hour period) 50 tablet 0   timolol (TIMOPTIC) 0.5 % ophthalmic solution Place 1 drop into the right eye 2  times daily.     predniSONE (DELTASONE) 10 MG tablet Take 2 tablets (20 mg total) by mouth daily with breakfast. 10 tablet 0   No facility-administered medications prior to visit.    Review of Systems  Constitutional:  Positive for malaise/fatigue. Negative for chills, fever and weight loss.  HENT:  Negative for congestion, sinus pain and sore throat.   Eyes: Negative.   Respiratory:  Positive for cough, sputum production, shortness of breath and wheezing. Negative for hemoptysis.   Cardiovascular:  Negative for chest pain, palpitations, orthopnea, claudication and leg swelling.  Gastrointestinal:  Negative for abdominal pain, heartburn, nausea and vomiting.  Genitourinary: Negative.   Musculoskeletal:  Negative for joint pain and myalgias.  Skin:  Negative for rash.  Neurological:  Negative for weakness.  Endo/Heme/Allergies: Negative.   Psychiatric/Behavioral: Negative.     Objective:   Vitals:   10/24/22 1323  BP: 116/74  Pulse: 68  SpO2: 95%  Weight: 125 lb 12.8 oz (57.1 kg)  Height: '5\' 6"'$  (1.676 m)     Physical Exam Constitutional:      General: He is not in acute distress. HENT:     Head: Normocephalic and atraumatic.  Eyes:     Conjunctiva/sclera: Conjunctivae normal.  Cardiovascular:     Rate and Rhythm: Normal rate and regular rhythm.     Pulses: Normal pulses.     Heart sounds: Normal heart sounds. No murmur  heard. Pulmonary:     Effort: Pulmonary effort is normal.     Breath sounds: Decreased air movement present. Wheezing (diffuse) present. No rhonchi or rales.  Musculoskeletal:     Right lower leg: No edema.     Left lower leg: No edema.  Skin:    General: Skin is warm and dry.  Neurological:     General: No focal deficit present.     Mental Status: He is alert.    CBC    Component Value Date/Time   WBC 6.8 08/30/2022 0941   RBC 5.18 08/30/2022 0941   HGB 15.1 08/30/2022 0941   HCT 45.2 08/30/2022 0941   PLT 254.0 08/30/2022 0941   MCV 87.3 08/30/2022 0941   MCH 31.1 06/14/2017 0705   MCHC 33.3 08/30/2022 0941   RDW 14.8 08/30/2022 0941   LYMPHSABS 1.1 08/30/2022 0941   MONOABS 0.8 08/30/2022 0941   EOSABS 0.2 08/30/2022 0941   BASOSABS 0.1 08/30/2022 0941   Chest imaging: HRCT Chest 2017 1. Moderate to severe centrilobular emphysema. No evidence of interstitial lung disease. 2. Basilar subpleural nodules are likely subpleural lymph nodes.  PFT:    Latest Ref Rng & Units 04/19/2022   11:51 AM  PFT Results  FVC-Pre L 2.58   FVC-Predicted Pre % 69   FVC-Post L 2.74   FVC-Predicted Post % 74   Pre FEV1/FVC % % 39   Post FEV1/FCV % % 39   FEV1-Pre L 1.01   FEV1-Predicted Pre % 37   FEV1-Post L 1.07   DLCO uncorrected ml/min/mmHg 7.75   DLCO UNC% % 34   DLCO corrected ml/min/mmHg 7.75   DLCO COR %Predicted % 34   DLVA Predicted % 37   TLC L 7.91   TLC % Predicted % 127   RV % Predicted % 196     Assessment & Plan:   COPD with acute exacerbation (HCC) - Plan: predniSONE (DELTASONE) 10 MG tablet, azithromycin (ZITHROMAX) 250 MG tablet  Cigarette smoker - Plan: Ambulatory Referral for Lung Cancer Scre  Discussion: Martin Reichmann  Santiago is a 72 year old male, daily smoker with hypertension and emphysema who returns to pulmonary clinic for COPD follow up.   He is to continue breztri inhaler and albuterol nebulizer treatments. He is to use albuterol inhaler or neb as  needed.  We will send in extended prednisone taper and Zpak for COPD with exacerbatio based on his wheezing on exam with increased mucous production.   We discussed pulmonary rehab but he would like to hold off at this time. We will refer him to lung cancer screening team.  Follow up in 6 months.   Freda Jackson, MD [facility] Office: [phone]   Current Outpatient Medications:    albuterol (PROVENTIL) (2.5 MG/3ML) 0.083% nebulizer solution, Take 3 mLs (2.5 mg total) by nebulization every 4 (four) hours as needed for wheezing or shortness of breath., Disp: 540 mL, Rfl: 11   albuterol (VENTOLIN HFA) 108 (90 Base) MCG/ACT inhaler, Inhale 2 puffs into the lungs every 6 (six) hours as needed for wheezing or shortness of breath., Disp: 8 g, Rfl: 6   amLODipine (NORVASC) 10 MG tablet, Take 1 tablet (10 mg total) by mouth daily. Reported on 02/25/2016, Disp: 90 tablet, Rfl: 3   azithromycin (ZITHROMAX) 250 MG tablet, Take as directed, Disp: 6 tablet, Rfl: 0   BREZTRI AEROSPHERE 160-9-4.8 MCG/ACT AERO, INHALE 2 PUFFS INTO THE LUNGS IN THE MORNING AND AT BEDTIME, Disp: 10.7 g, Rfl: 5   COVID-19 mRNA bivalent vaccine, Moderna, (MODERNA COVID-19 BIVAL BOOSTER) 50 MCG/0.5ML injection, Inject into the muscle., Disp: 0.5 mL, Rfl: 0   FEROSUL 325 (65 Fe) MG tablet, TAKE 1 TABLET BY MOUTH TWICE DAILY, Disp: 60 tablet, Rfl: 1   losartan (COZAAR) 100 MG tablet, Take 1 tablet (100 mg total) by mouth daily. Reported on 02/25/2016, Disp: 90 tablet, Rfl: 3   omeprazole (PRILOSEC) 20 MG capsule, Take 1 capsule (20 mg total) by mouth daily., Disp: 30 capsule, Rfl: 11   predniSONE (DELTASONE) 10 MG tablet, Take 4 tablets (40 mg total) by mouth daily with breakfast for 3 days, THEN 3 tablets (30 mg total) daily with breakfast for 3 days, THEN 2 tablets (20 mg total) daily with breakfast for 3 days, THEN 1 tablet (10 mg total) daily with breakfast for 3 days., Disp: 30 tablet, Rfl: 0    sildenafil (REVATIO) 20 MG tablet, 3-5 tab prior to sex.(Max use once in 24 hour period), Disp: 50 tablet, Rfl: 0   timolol (TIMOPTIC) 0.5 % ophthalmic solution, Place 1 drop into the right eye 2 times daily., Disp: , Rfl:

## 2022-10-24 NOTE — Patient Instructions (Addendum)
Start prednisone taper as prescribed  Start Zpak course of antibiotic   Continue breztri inhaler 2 puffs twice daily - rinse mouth out after each use  Use albuterol inhaler or nebulizer treatment every 4-6 hours as needed for wheezing, cough, shortness of breath  Continue to cut back on your smoking  Follow up in 6 months.

## 2022-10-31 ENCOUNTER — Encounter: Payer: Self-pay | Admitting: Medical

## 2022-11-01 MED ORDER — OMEPRAZOLE 20 MG PO CPDR: 20.0000 mg | DELAYED_RELEASE_CAPSULE | Freq: Every day | ORAL | 2 refills | Status: DC

## 2022-11-02 ENCOUNTER — Telehealth: Payer: Self-pay | Admitting: Pulmonary Disease

## 2022-11-02 NOTE — Telephone Encounter (Signed)
Received fax from [employer] for patient's Breztri's application. Patient has been approved until 10/27/2023.   Called and spoke with patient. He verbalized understand and appreciation.   Nothing further needed at time of call.

## 2022-11-08 ENCOUNTER — Ambulatory Visit (INDEPENDENT_AMBULATORY_CARE_PROVIDER_SITE_OTHER): Payer: Medicare Other | Admitting: *Deleted

## 2022-11-08 DIAGNOSIS — E538 Deficiency of other specified B group vitamins: Secondary | ICD-10-CM

## 2022-11-08 MED ORDER — CYANOCOBALAMIN 1000 MCG/ML IJ SOLN
1000.0000 ug | Freq: Once | INTRAMUSCULAR | Status: AC
  Administered 2022-11-08: 1000 ug via INTRAMUSCULAR

## 2022-11-08 NOTE — Progress Notes (Signed)
Pt here for monthly B12 injection per Mackie Pai, PA-C  B12 1031mg given in right deltoid and pt tolerated injection well.  Next B12 injection scheduled for 12/07/22

## 2022-11-23 ENCOUNTER — Encounter: Payer: Self-pay | Admitting: Medical

## 2022-11-23 MED ORDER — AMLODIPINE BESYLATE 10 MG PO TABS: 10.0000 mg | ORAL_TABLET | Freq: Every day | ORAL | 3 refills | Status: DC

## 2022-11-23 MED ORDER — LOSARTAN POTASSIUM 100 MG PO TABS: 100.0000 mg | ORAL_TABLET | Freq: Every day | ORAL | 3 refills | Status: DC

## 2022-11-24 ENCOUNTER — Encounter: Payer: Self-pay | Admitting: Radiology

## 2022-12-07 ENCOUNTER — Ambulatory Visit (INDEPENDENT_AMBULATORY_CARE_PROVIDER_SITE_OTHER): Payer: Medicare Other

## 2022-12-07 DIAGNOSIS — E538 Deficiency of other specified B group vitamins: Secondary | ICD-10-CM

## 2022-12-07 MED ORDER — CYANOCOBALAMIN 1000 MCG/ML IJ SOLN
1000.0000 ug | Freq: Once | INTRAMUSCULAR | Status: AC
  Administered 2022-12-07: 1000 ug via INTRAMUSCULAR

## 2022-12-07 NOTE — Progress Notes (Signed)
Martin Santiago is a 72 y.o. male presents to the office today for B12: injections, per physician's orders. Original order: on office visit from 09/05/2022 to continue monthly B12 injections. Cyanocobalamin (med), 1000 mg/ml (dose), IM (route) was administered Left deltoid (location) today. Patient tolerated injection.  Patient next injection due: in one month, appt made Yes for

## 2023-01-02 ENCOUNTER — Encounter: Payer: Self-pay | Admitting: *Deleted

## 2023-01-03 ENCOUNTER — Encounter: Payer: Self-pay | Admitting: Pulmonary Disease

## 2023-01-03 DIAGNOSIS — J441 Chronic obstructive pulmonary disease with (acute) exacerbation: Secondary | ICD-10-CM

## 2023-01-04 ENCOUNTER — Telehealth: Payer: Self-pay | Admitting: Pulmonary Disease

## 2023-01-04 ENCOUNTER — Ambulatory Visit (INDEPENDENT_AMBULATORY_CARE_PROVIDER_SITE_OTHER): Payer: Medicare Other

## 2023-01-04 DIAGNOSIS — E538 Deficiency of other specified B group vitamins: Secondary | ICD-10-CM

## 2023-01-04 MED ORDER — AZITHROMYCIN 250 MG PO TABS: ORAL_TABLET | ORAL | 0 refills | Status: DC

## 2023-01-04 MED ORDER — PREDNISONE 10 MG PO TABS: 40.0000 mg | ORAL_TABLET | Freq: Every day | ORAL | 0 refills | Status: DC

## 2023-01-04 MED ORDER — CYANOCOBALAMIN 1000 MCG/ML IJ SOLN
1000.0000 ug | Freq: Once | INTRAMUSCULAR | Status: AC
  Administered 2023-01-04: 1000 ug via INTRAMUSCULAR

## 2023-01-04 NOTE — Telephone Encounter (Signed)
Nothing further needed 

## 2023-01-04 NOTE — Telephone Encounter (Signed)
In error

## 2023-01-04 NOTE — Telephone Encounter (Signed)
Dr. Francine Graven would you please advise on the following My Chart message:   Melodie Bouillon [facility] (supporting Martina Sinner, MD)19 hours ago (2:06 PM)    I'm sure I'm not the only patient having increased lung congestion during the last week. I don't remember ever noticing pollen difficulties as much before this year.   I'm using the albuterol Nebulizer several times a day plus 2 puffs of the Breztri inhaler 2x a day.   Is there anything else you would suggest during this pollen season?    Thanks! [place]   Thank you

## 2023-01-04 NOTE — Telephone Encounter (Signed)
I have sent in 5 days of prednisone 40mg  and a Zpak for COPD exacerbation.  Thanks, JD

## 2023-01-04 NOTE — Progress Notes (Signed)
Martin Santiago is a 72 y.o. male presents to the office today for B12: injections, per physician's orders. Original order: on office visit from 09/05/2022 to continue monthly B12 injections. Cyanocobalamin (med), 1000 mg/ml (dose), IM (route) was administered Left deltoid (location) today. Patient tolerated injection.

## 2023-01-06 ENCOUNTER — Telehealth: Payer: Self-pay | Admitting: Medical

## 2023-01-06 NOTE — Telephone Encounter (Signed)
Contacted Martin Santiago to schedule their annual wellness visit. Appointment made for 01/16/2023.  [name]; Care Guide Ambulatory Clinical Support [place] [facility] Direct Dial: [phone]

## 2023-01-16 ENCOUNTER — Ambulatory Visit (INDEPENDENT_AMBULATORY_CARE_PROVIDER_SITE_OTHER): Payer: Medicare Other | Admitting: *Deleted

## 2023-01-16 DIAGNOSIS — Z0000 Encounter for general adult medical examination without abnormal findings: Secondary | ICD-10-CM | POA: Diagnosis not present

## 2023-01-16 NOTE — Progress Notes (Signed)
Subjective:   Martin Santiago is a 72 y.o. male who presents for Medicare Annual/Subsequent preventive examination.  I connected with  Ernesto Rutherford on 01/16/23 by a audio enabled telemedicine application and verified that I am speaking with the correct person using two identifiers.  Patient Location: Home  Provider Location: Office/Clinic  I discussed the limitations of evaluation and management by telemedicine. The patient expressed understanding and agreed to proceed.   Review of Systems     Cardiac Risk Factors include: male gender;advanced age (>78men, >29 women);hypertension;sedentary lifestyle     Objective:    There were no vitals filed for this visit. There is no height or weight on file to calculate BMI.     01/16/2023    1:00 PM 01/06/2022   12:25 PM 06/13/2017   10:42 AM 12/03/2012    7:04 AM  Advanced Directives  Does Patient Have a Medical Advance Directive? Yes Yes No;Yes Patient has advance directive, copy not in chart  Type of Advance Directive Healthcare Power of [place];Living will Healthcare Power of [place];Living will Living will Living will  Does patient want to make changes to medical advance directive? No - Patient declined     Copy of Healthcare Power of Attorney in Chart? Yes - validated most recent copy scanned in chart (See row information) Yes - validated most recent copy scanned in chart (See row information)    Would patient like information on creating a medical advance directive?   No - Patient declined   Pre-existing out of facility DNR order (yellow form or pink MOST form)    No    Current Medications (verified) Outpatient Encounter Medications as of 01/16/2023  Medication Sig   albuterol (PROVENTIL) (2.5 MG/3ML) 0.083% nebulizer solution Take 3 mLs (2.5 mg total) by nebulization every 4 (four) hours as needed for wheezing or shortness of breath.   albuterol (VENTOLIN HFA) 108 (90 Base) MCG/ACT inhaler Inhale 2 puffs into the lungs every 6  (six) hours as needed for wheezing or shortness of breath.   amLODipine (NORVASC) 10 MG tablet Take 1 tablet (10 mg total) by mouth daily. Reported on 02/25/2016   BREZTRI AEROSPHERE 160-9-4.8 MCG/ACT AERO INHALE 2 PUFFS INTO THE LUNGS IN THE MORNING AND AT BEDTIME   COVID-19 mRNA bivalent vaccine, Moderna, (MODERNA COVID-19 BIVAL BOOSTER) 50 MCG/0.5ML injection Inject into the muscle.   FEROSUL 325 (65 Fe) MG tablet TAKE 1 TABLET BY MOUTH TWICE DAILY   losartan (COZAAR) 100 MG tablet Take 1 tablet (100 mg total) by mouth daily. Reported on 02/25/2016   omeprazole (PRILOSEC) 20 MG capsule Take 1 capsule (20 mg total) by mouth daily.   sildenafil (REVATIO) 20 MG tablet 3-5 tab prior to sex.(Max use once in 24 hour period)   timolol (TIMOPTIC) 0.5 % ophthalmic solution Place 1 drop into the right eye 2 times daily.   [DISCONTINUED] azithromycin (ZITHROMAX) 250 MG tablet Take as directed   [DISCONTINUED] predniSONE (DELTASONE) 10 MG tablet Take 4 tablets (40 mg total) by mouth daily with breakfast.   No facility-administered encounter medications on file as of 01/16/2023.    Allergies (verified) Codeine   History: Past Medical History:  Diagnosis Date   Colon polyps    COPD (chronic obstructive pulmonary disease) (HCC)    Crohn's disease (HCC)    Detached retina    Essential hypertension    Tropical sprue    Past Surgical History:  Procedure Laterality Date   BOWEL RESECTION  05/1994  [place]:  ? Crohn's   COLONOSCOPY     Dr Clide Dales, [facility]:  Hx polyps. Cannot remmeber date   COLONOSCOPY N/A 12/03/2012   Procedure: COLONOSCOPY;  Surgeon: Corbin Ade, MD;  Location: [facility];  Service: Endoscopy;  Laterality: N/A;  7:30   INGUINAL HERNIA REPAIR Right    RETINAL DETACHMENT SURGERY  07/2006   RETINAL DETACHMENT SURGERY  09/2006   RETINAL DETACHMENT SURGERY  01/2007   Family History  Problem Relation Age of Onset   Colon cancer Maternal Aunt    Breast cancer Mother    Social  History   Socioeconomic History   Marital status: Widowed    Spouse name: Not on file   Number of children: 0   Years of education: Not on file   Highest education level: Not on file  Occupational History   Occupation: retired, Counsellor, church organist    Employer: SELF EMPLOYED  Tobacco Use   Smoking status: Every Day    Packs/day: 1.50    Years: 45.00    Additional pack years: 0.00    Total pack years: 67.50    Types: Cigarettes   Smokeless tobacco: Never   Tobacco comments:    Smoke 1/4 pack a day. Tay 04/19/22  Substance and Sexual Activity   Alcohol use: Yes    Alcohol/week: 0.0 standard drinks of alcohol    Comment: Rare, 3-4 per yr   Drug use: No    Comment: Remote marijuana   Sexual activity: Not Currently  Other Topics Concern   Not on file  Social History Narrative   Wife, Darl Pikes passed away in 12-15-21 from [place].   1 step-daughter who lives in [place].   Social Determinants of Health   Financial Resource Strain: Low Risk  (01/06/2022)   Overall Financial Resource Strain (CARDIA)    Difficulty of Paying Living Expenses: Not hard at all  Food Insecurity: No Food Insecurity (01/16/2023)   Hunger Vital Sign    Worried About Running Out of Food in the Last Year: Never true    Ran Out of Food in the Last Year: Never true  Transportation Needs: No Transportation Needs (01/16/2023)   PRAPARE - Administrator, Civil Service (Medical): No    Lack of Transportation (Non-Medical): No  Physical Activity: Inactive (01/06/2022)   Exercise Vital Sign    Days of Exercise per Week: 0 days    Minutes of Exercise per Session: 0 min  Stress: No Stress Concern Present (01/06/2022)   [employer] of Occupational Health - Occupational Stress Questionnaire    Feeling of Stress : Only a little  Social Connections: Moderately Isolated (01/06/2022)   Social Connection and Isolation Panel [NHANES]    Frequency of Communication with Friends and Family: More  than three times a week    Frequency of Social Gatherings with Friends and Family: More than three times a week    Attends Religious Services: 1 to 4 times per year    Active Member of [employer] or Organizations: No    Attends Banker Meetings: Never    Marital Status: Widowed    Tobacco Counseling Ready to quit: Not Answered Counseling given: Not Answered Tobacco comments: Smoke 1/4 pack a day. Tay 04/19/22   Clinical Intake:  Pre-visit preparation completed: Yes  Pain : No/denies pain  Nutritional Risks: None Diabetes: No  How often do you need to have someone help you when you read instructions, pamphlets, or other  written materials from your doctor or pharmacy?: 1 - Never  Activities of Daily Living    01/16/2023    1:14 PM  In your present state of health, do you have any difficulty performing the following activities:  Hearing? 0  Vision? 1  Difficulty concentrating or making decisions? 1  Comment making decisions  Walking or climbing stairs? 1  Comment respiratory issues  Dressing or bathing? 0  Doing errands, shopping? 0  Preparing Food and eating ? N  Using the Toilet? N  In the past six months, have you accidently leaked urine? N  Do you have problems with loss of bowel control? N  Managing your Medications? N  Managing your Finances? N  Housekeeping or managing your Housekeeping? N    Patient Care Team: Saguier, Kateri Mc as PCP - General (Internal Medicine)  Indicate any recent Medical Services you may have received from other than [facility] providers in the past year (date may be approximate).     Assessment:   This is a routine wellness examination for Martin Santiago.  Hearing/Vision screen No results found.  Dietary issues and exercise activities discussed: Current Exercise Habits: The patient does not participate in regular exercise at present, Exercise limited by: respiratory conditions(s)   Goals Addressed   None    Depression Screen     01/16/2023    1:08 PM 01/06/2022   12:11 PM 08/18/2021    1:59 PM  PHQ 2/9 Scores  PHQ - 2 Score 2 1 1   PHQ- 9 Score 2      Fall Risk    01/16/2023    1:03 PM 01/06/2022   12:25 PM 08/18/2021    1:59 PM 04/21/2017    1:34 PM  Fall Risk   Falls in the past year? 1 0 0 No  Comment    Emmi Telephone Survey: data to providers prior to load  Number falls in past yr: 0 0 0   Injury with Fall? 0 0 0   Risk for fall due to : No Fall Risks No Fall Risks    Follow up Falls evaluation completed Falls prevention discussed      FALL RISK PREVENTION PERTAINING TO THE HOME:  Any stairs in or around the home? Yes  If so, are there any without handrails? No  Home free of loose throw rugs in walkways, pet beds, electrical cords, etc? Yes  Adequate lighting in your home to reduce risk of falls? Yes   ASSISTIVE DEVICES UTILIZED TO PREVENT FALLS:  Life alert? No  Use of a cane, walker or w/c? No  Grab bars in the bathroom? Yes  Shower chair or bench in shower? No  Elevated toilet seat or a handicapped toilet? No   TIMED UP AND GO:  Was the test performed?  No, audio visit .    Cognitive Function:        01/16/2023    1:23 PM 01/06/2022   12:29 PM  6CIT Screen  What Year? 0 points 0 points  What month? 0 points 0 points  What time? 0 points 0 points  Count back from 20 0 points 0 points  Months in reverse 0 points 0 points  Repeat phrase 0 points 0 points  Total Score 0 points 0 points    Immunizations Immunization History  Administered Date(s) Administered   Fluad Quad(high Dose 65+) 06/25/2021, 06/02/2022   Influenza, High Dose Seasonal PF 06/15/2017   Moderna Covid-19 Vaccine Bivalent Booster 42yrs & up 07/13/2021, 06/30/2022  Moderna Sars-Covid-2 Vaccination 10/31/2019, 11/29/2019, 07/17/2020, 01/22/2021   PNEUMOCOCCAL CONJUGATE-20 02/02/2022   Pneumococcal Polysaccharide-23 06/15/2017   Respiratory Syncytial Virus Vaccine,Recomb Aduvanted(Arexvy) 06/30/2022    TDAP  status: Due, Education has been provided regarding the importance of this vaccine. Advised may receive this vaccine at local pharmacy or Health Dept. Aware to provide a copy of the vaccination record if obtained from local pharmacy or Health Dept. Verbalized acceptance and understanding.  Flu Vaccine status: Up to date  Pneumococcal vaccine status: Up to date  Covid-19 vaccine status: Information provided on how to obtain vaccines.   Qualifies for Shingles Vaccine? Yes   Zostavax completed No   Shingrix Completed?: No.    Education has been provided regarding the importance of this vaccine. Patient has been advised to call insurance company to determine out of pocket expense if they have not yet received this vaccine. Advised may also receive vaccine at local pharmacy or Health Dept. Verbalized acceptance and understanding.  Screening Tests Health Maintenance  Topic Date Due   Hepatitis C Screening  Never done   DTaP/Tdap/Td (1 - Tdap) Never done   Zoster Vaccines- Shingrix (1 of 2) Never done   Lung Cancer Screening  01/12/2017   COVID-19 Vaccine (7 - 2023-24 season) 08/25/2022   COLONOSCOPY (Pts 45-32yrs Insurance coverage will need to be confirmed)  12/04/2022   Medicare Annual Wellness (AWV)  01/07/2023   INFLUENZA VACCINE  04/27/2023   Pneumonia Vaccine 67+ Years old  Completed   HPV VACCINES  Aged Out    Health Maintenance  Health Maintenance Due  Topic Date Due   Hepatitis C Screening  Never done   DTaP/Tdap/Td (1 - Tdap) Never done   Zoster Vaccines- Shingrix (1 of 2) Never done   Lung Cancer Screening  01/12/2017   COVID-19 Vaccine (7 - 2023-24 season) 08/25/2022   COLONOSCOPY (Pts 45-58yrs Insurance coverage will need to be confirmed)  12/04/2022   Medicare Annual Wellness (AWV)  01/07/2023    Colorectal cancer screening: Type of screening: Colonoscopy. Completed 12/03/12. Repeat every 10 years  Lung Cancer Screening: (Low Dose CT Chest recommended if Age 70-80  years, 30 pack-year currently smoking OR have quit w/in 15years.) does qualify.   Lung Cancer Screening Referral: ordered 10/24/22  Additional Screening:  Hepatitis C Screening: does qualify; Completed N/a  Vision Screening: Recommended annual ophthalmology exams for early detection of glaucoma and other disorders of the eye. Is the patient up to date with their annual eye exam?  Yes  Who is the provider or what is the name of the office in which the patient attends annual eye exams? Dr. Charlotte Sanes If pt is not established with a provider, would they like to be referred to a provider to establish care? No .   Dental Screening: Recommended annual dental exams for proper oral hygiene  Community Resource Referral / Chronic Care Management: CRR required this visit?  No   CCM required this visit?  No      Plan:     I have personally reviewed and noted the following in the patient's chart:   Medical and social history Use of alcohol, tobacco or illicit drugs  Current medications and supplements including opioid prescriptions. Patient is not currently taking opioid prescriptions. Functional ability and status Nutritional status Physical activity Advanced directives List of other physicians Hospitalizations, surgeries, and ER visits in previous 12 months Vitals Screenings to include cognitive, depression, and falls Referrals and appointments  In addition, I have reviewed and discussed  with patient certain preventive protocols, quality metrics, and best practice recommendations. A written personalized care plan for preventive services as well as general preventive health recommendations were provided to patient.   Due to this being a telephonic visit, the after visit summary with patients personalized plan was offered to patient via mail or my-chart. Patient would like to access on my-chart.  Donne Anon, [place]   01/16/2023   Nurse Notes: None

## 2023-01-16 NOTE — Patient Instructions (Signed)
[name] , Thank you for taking time to come for your Medicare Wellness Visit. I appreciate your ongoing commitment to your health goals. Please review the following plan we discussed and let me know if I can assist you in the future.     This is a list of the screening recommended for you and due dates:  Health Maintenance  Topic Date Due   Hepatitis C Screening: USPSTF Recommendation to screen - Ages 67-74 yo.  Never done   DTaP/Tdap/Td vaccine (1 - Tdap) Never done   Zoster (Shingles) Vaccine (1 of 2) Never done   Screening for Lung Cancer  01/12/2017   COVID-19 Vaccine (7 - 2023-24 season) 08/25/2022   Colon Cancer Screening  12/04/2022   Flu Shot  04/27/2023   Medicare Annual Wellness Visit  01/16/2024   Pneumonia Vaccine  Completed   HPV Vaccine  Aged Out    Next appointment: Follow up in one year for your annual wellness visit.   Preventive Care 64 Years and Older, Male Preventive care refers to lifestyle choices and visits with your health care provider that can promote health and wellness. What does preventive care include? A yearly physical exam. This is also called an annual well check. Dental exams once or twice a year. Routine eye exams. Ask your health care provider how often you should have your eyes checked. Personal lifestyle choices, including: Daily care of your teeth and gums. Regular physical activity. Eating a healthy diet. Avoiding tobacco and drug use. Limiting alcohol use. Practicing safe sex. Taking low doses of aspirin every day. Taking vitamin and mineral supplements as recommended by your health care provider. What happens during an annual well check? The services and screenings done by your health care provider during your annual well check will depend on your age, overall health, lifestyle risk factors, and family history of disease. Counseling  Your health care provider may ask you questions about your: Alcohol use. Tobacco use. Drug  use. Emotional well-being. Home and relationship well-being. Sexual activity. Eating habits. History of falls. Memory and ability to understand (cognition). Work and work Astronomer. Screening  You may have the following tests or measurements: Height, weight, and BMI. Blood pressure. Lipid and cholesterol levels. These may be checked every 5 years, or more frequently if you are over 74 years old. Skin check. Lung cancer screening. You may have this screening every year starting at age 67 if you have a 30-pack-year history of smoking and currently smoke or have quit within the past 15 years. Fecal occult blood test (FOBT) of the stool. You may have this test every year starting at age 80. Flexible sigmoidoscopy or colonoscopy. You may have a sigmoidoscopy every 5 years or a colonoscopy every 10 years starting at age 30. Prostate cancer screening. Recommendations will vary depending on your family history and other risks. Hepatitis C blood test. Hepatitis B blood test. Sexually transmitted disease (STD) testing. Diabetes screening. This is done by checking your blood sugar (glucose) after you have not eaten for a while (fasting). You may have this done every 1-3 years. Abdominal aortic aneurysm (AAA) screening. You may need this if you are a current or former smoker. Osteoporosis. You may be screened starting at age 94 if you are at high risk. Talk with your health care provider about your test results, treatment options, and if necessary, the need for more tests. Vaccines  Your health care provider may recommend certain vaccines, such as: Influenza vaccine. This is recommended  every year. Tetanus, diphtheria, and acellular pertussis (Tdap, Td) vaccine. You may need a Td booster every 10 years. Zoster vaccine. You may need this after age 75. Pneumococcal 13-valent conjugate (PCV13) vaccine. One dose is recommended after age 9. Pneumococcal polysaccharide (PPSV23) vaccine. One dose is  recommended after age 61. Talk to your health care provider about which screenings and vaccines you need and how often you need them. This information is not intended to replace advice given to you by your health care provider. Make sure you discuss any questions you have with your health care provider. Document Released: 10/09/2015 Document Revised: 06/01/2016 Document Reviewed: 07/14/2015 Elsevier Interactive Patient Education  2017 [place] Prevention in the Home Falls can cause injuries. They can happen to people of all ages. There are many things you can do to make your home safe and to help prevent falls. What can I do on the outside of my home? Regularly fix the edges of walkways and driveways and fix any cracks. Remove anything that might make you trip as you walk through a door, such as a raised step or threshold. Trim any bushes or trees on the path to your home. Use bright outdoor lighting. Clear any walking paths of anything that might make someone trip, such as rocks or tools. Regularly check to see if handrails are loose or broken. Make sure that both sides of any steps have handrails. Any raised decks and porches should have guardrails on the edges. Have any leaves, snow, or ice cleared regularly. Use sand or salt on walking paths during winter. Clean up any spills in your garage right away. This includes oil or grease spills. What can I do in the bathroom? Use night lights. Install grab bars by the toilet and in the tub and shower. Do not use towel bars as grab bars. Use non-skid mats or decals in the tub or shower. If you need to sit down in the shower, use a plastic, non-slip stool. Keep the floor dry. Clean up any water that spills on the floor as soon as it happens. Remove soap buildup in the tub or shower regularly. Attach bath mats securely with double-sided non-slip rug tape. Do not have throw rugs and other things on the floor that can make you  trip. What can I do in the bedroom? Use night lights. Make sure that you have a light by your bed that is easy to reach. Do not use any sheets or blankets that are too big for your bed. They should not hang down onto the floor. Have a firm chair that has side arms. You can use this for support while you get dressed. Do not have throw rugs and other things on the floor that can make you trip. What can I do in the kitchen? Clean up any spills right away. Avoid walking on wet floors. Keep items that you use a lot in easy-to-reach places. If you need to reach something above you, use a strong step stool that has a grab bar. Keep electrical cords out of the way. Do not use floor polish or wax that makes floors slippery. If you must use wax, use non-skid floor wax. Do not have throw rugs and other things on the floor that can make you trip. What can I do with my stairs? Do not leave any items on the stairs. Make sure that there are handrails on both sides of the stairs and use them. Fix handrails that are broken  or loose. Make sure that handrails are as long as the stairways. Check any carpeting to make sure that it is firmly attached to the stairs. Fix any carpet that is loose or worn. Avoid having throw rugs at the top or bottom of the stairs. If you do have throw rugs, attach them to the floor with carpet tape. Make sure that you have a light switch at the top of the stairs and the bottom of the stairs. If you do not have them, ask someone to add them for you. What else can I do to help prevent falls? Wear shoes that: Do not have high heels. Have rubber bottoms. Are comfortable and fit you well. Are closed at the toe. Do not wear sandals. If you use a stepladder: Make sure that it is fully opened. Do not climb a closed stepladder. Make sure that both sides of the stepladder are locked into place. Ask someone to hold it for you, if possible. Clearly mark and make sure that you can  see: Any grab bars or handrails. First and last steps. Where the edge of each step is. Use tools that help you move around (mobility aids) if they are needed. These include: Canes. Walkers. Scooters. Crutches. Turn on the lights when you go into a dark area. Replace any light bulbs as soon as they burn out. Set up your furniture so you have a clear path. Avoid moving your furniture around. If any of your floors are uneven, fix them. If there are any pets around you, be aware of where they are. Review your medicines with your doctor. Some medicines can make you feel dizzy. This can increase your chance of falling. Ask your doctor what other things that you can do to help prevent falls. This information is not intended to replace advice given to you by your health care provider. Make sure you discuss any questions you have with your health care provider. Document Released: 07/09/2009 Document Revised: 02/18/2016 Document Reviewed: 10/17/2014 Elsevier Interactive Patient Education  2017 [employer].

## 2023-01-23 ENCOUNTER — Encounter: Payer: Self-pay | Admitting: Pulmonary Disease

## 2023-01-24 ENCOUNTER — Other Ambulatory Visit: Payer: Self-pay

## 2023-01-24 DIAGNOSIS — Z87891 Personal history of nicotine dependence: Secondary | ICD-10-CM

## 2023-01-24 DIAGNOSIS — F17210 Nicotine dependence, cigarettes, uncomplicated: Secondary | ICD-10-CM

## 2023-02-03 ENCOUNTER — Ambulatory Visit (INDEPENDENT_AMBULATORY_CARE_PROVIDER_SITE_OTHER): Payer: Medicare Other

## 2023-02-03 DIAGNOSIS — E538 Deficiency of other specified B group vitamins: Secondary | ICD-10-CM

## 2023-02-03 MED ORDER — CYANOCOBALAMIN 1000 MCG/ML IJ SOLN
1000.0000 ug | Freq: Once | INTRAMUSCULAR | Status: AC
  Administered 2023-02-03: 1000 ug via INTRAMUSCULAR

## 2023-02-03 NOTE — Progress Notes (Signed)
Pt here for monthly B12 injection per Edward Saguier,PA-C  B12 1000mcg given IM, left deltoid and pt tolerated injection well.  Next B12 injection scheduled for next month 

## 2023-02-10 ENCOUNTER — Ambulatory Visit (INDEPENDENT_AMBULATORY_CARE_PROVIDER_SITE_OTHER): Payer: Medicare Other | Admitting: Physician Assistant

## 2023-02-10 ENCOUNTER — Encounter: Payer: Self-pay | Admitting: Physician Assistant

## 2023-02-10 DIAGNOSIS — F17210 Nicotine dependence, cigarettes, uncomplicated: Secondary | ICD-10-CM

## 2023-02-10 NOTE — Patient Instructions (Signed)
Thank you for participating in the [facility]. It was our pleasure to meet you today. We will call you with the results of your scan within the next few days. Your scan will be assigned a Lung RADS category score by the physicians reading the scans.  This Lung RADS score determines follow up scanning.  See below for description of categories, and follow up screening recommendations. We will be in touch to schedule your follow up screening annually or based on recommendations of our providers. We will fax a copy of your scan results to your Primary Care Physician, or the physician who referred you to the program, to ensure they have the results. Please call the office if you have any questions or concerns regarding your scanning experience or results.  Our office number is [phone]. Please speak with Denise Phelps, RN. , or  Denise Buckner RN, They are  our Lung Cancer Screening RN.'s If They are unavailable when you call, Please leave a message on the voice mail. We will return your call at our earliest convenience.This voice mail is monitored several times a day.  Remember, if your scan is normal, we will scan you annually as long as you continue to meet the criteria for the program. (Age 50-80, Current smoker or smoker who has quit within the last 15 years). If you are a smoker, remember, quitting is the single most powerful action that you can take to decrease your risk of lung cancer and other pulmonary, breathing related problems. We know quitting is hard, and we are here to help.  Please let us know if there is anything we can do to help you meet your goal of quitting. If you are a former smoker, congratulations. We are proud of you! Remain smoke free! Remember you can refer friends or family members through the number above.  We will screen them to make sure they meet criteria for the program. Thank you for helping us take better care of you by  participating in Lung Screening.  You can receive free nicotine replacement therapy ( patches, gum or mints) by calling 1-800-QUIT NOW. Please call so we can get you on the path to becoming  a non-smoker. I know it is hard, but you can do this!  Lung RADS Categories:  Lung RADS 1: no nodules or definitely non-concerning nodules.  Recommendation is for a repeat annual scan in 12 months.  Lung RADS 2:  nodules that are non-concerning in appearance and behavior with a very low likelihood of becoming an active cancer. Recommendation is for a repeat annual scan in 12 months.  Lung RADS 3: nodules that are probably non-concerning , includes nodules with a low likelihood of becoming an active cancer.  Recommendation is for a 6-month repeat screening scan. Often noted after an upper respiratory illness. We will be in touch to make sure you have no questions, and to schedule your 6-month scan.  Lung RADS 4 A: nodules with concerning findings, recommendation is most often for a follow up scan in 3 months or additional testing based on our provider's assessment of the scan. We will be in touch to make sure you have no questions and to schedule the recommended 3 month follow up scan.  Lung RADS 4 B:  indicates findings that are concerning. We will be in touch with you to schedule additional diagnostic testing based on our provider's  assessment of the scan.  Other options for assistance in smoking cessation (   As covered by your insurance benefits)  Hypnosis for smoking cessation  [employer] [phone]  Acupuncture for smoking cessation  [facility] [phone]   

## 2023-02-10 NOTE — Progress Notes (Signed)
Virtual Visit via Telephone Note  I connected with Ernesto Rutherford on 02/10/23 at 11:00 AM EDT by telephone and verified that I am speaking with the correct person using two identifiers.  Location: Patient: home Provider: working virtually from home   I discussed the limitations, risks, security and privacy concerns of performing an evaluation and management service by telephone and the availability of in person appointments. I also discussed with the patient that there may be a patient responsible charge related to this service. The patient expressed understanding and agreed to proceed.     Shared Decision Making Visit Lung Cancer Screening Program [phone])   Eligibility: Age 72 y.o. Pack Years Smoking History Calculation 110 (# packs/per year x # years smoked) Recent History of coughing up blood  no Unexplained weight loss? no ( >Than 15 pounds within the last 6 months ) Prior History Lung / other cancer no (Diagnosis within the last 5 years already requiring surveillance chest CT Scans). Smoking Status Current Smoker   Visit Components: Discussion included one or more decision making aids. yes Discussion included risk/benefits of screening. yes Discussion included potential follow up diagnostic testing for abnormal scans. yes Discussion included meaning and risk of over diagnosis. yes Discussion included meaning and risk of False Positives. yes Discussion included meaning of total radiation exposure. yes  Counseling Included: Importance of adherence to annual lung cancer LDCT screening. yes Impact of comorbidities on ability to participate in the program. yes Ability and willingness to under diagnostic treatment. yes  Smoking Cessation Counseling: Current Smokers:  Discussed importance of smoking cessation. yes Information about tobacco cessation classes and interventions provided to patient. yes Patient provided with "ticket" for LDCT Scan. N/a Symptomatic Patient.  no Diagnosis Code: Tobacco Use Z72.0 Asymptomatic Patient yes  Counseling (Intermediate counseling: > three minutes counseling) Z3086 Diagnosis Code: Personal History of Nicotine Dependence. V78.469 Information about tobacco cessation classes and interventions provided to patient. Yes Written Order for Lung Cancer Screening with LDCT placed in Epic. Yes (CT Chest Lung Cancer Screening Low Dose W/O CM) GEX5284 Z12.2-Screening of respiratory organs Z87.891-Personal history of nicotine dependence  I have spent 25 minutes of face to face/ virtual visit   time with the patient discussing the risks and benefits of lung cancer screening. We viewed / discussed a power point together that explained in detail the above noted topics. We paused at intervals to allow for questions to be asked and answered to ensure understanding.We discussed that the single most powerful action that he can take to decrease his risk of developing lung cancer is to quit smoking. We discussed whether or not he is ready to commit to setting a quit date. We discussed options for tools to aid in quitting smoking including nicotine replacement therapy, non-nicotine medications, support groups, Quit Smart classes, and behavior modification. We discussed that often times setting smaller, more achievable goals, such as eliminating 1 cigarette a day for a week and then 2 cigarettes a day for a week can be helpful in slowly decreasing the number of cigarettes smoked. This allows for a sense of accomplishment as well as providing a clinical benefit. I provided  him  with smoking cessation  information  with contact information for community resources, classes, free nicotine replacement therapy, and access to mobile apps, text messaging, and on-line smoking cessation help. I have also provided  him  the office contact information in the event he needs to contact the screening staff. We discussed the time and location of  the scan, and that either  Abigail Miyamoto RN, Karlton Lemon, RN  or I will call / send a letter with the results within 24-72 hours of receiving them. The patient verbalized understanding of all of  the above and had no further questions upon leaving the office. They have my contact information in the event they have any further questions.  I spent three minutes counseling on smoking cessation and the health risks of continued tobacco abuse.  I explained to the patient that there has been a high incidence of coronary artery disease noted on these exams. I explained that this is a non-gated exam therefore degree or severity cannot be determined. This patient is not on statin therapy. I have asked the patient to follow-up with their PCP regarding any incidental finding of coronary artery disease and management with diet or medication as their PCP  feels is clinically indicated. The patient verbalized understanding of the above and had no further questions upon completion of the visit.       Darcella Gasman Nabiha Planck, PA-C

## 2023-02-13 ENCOUNTER — Ambulatory Visit (HOSPITAL_BASED_OUTPATIENT_CLINIC_OR_DEPARTMENT_OTHER)
Admission: RE | Admit: 2023-02-13 | Discharge: 2023-02-13 | Disposition: A | Discharge status: Home / Self Care | Payer: Medicare Other | Source: Ambulatory Visit | Attending: Acute Care | Admitting: Acute Care

## 2023-02-13 DIAGNOSIS — Z122 Encounter for screening for malignant neoplasm of respiratory organs: Secondary | ICD-10-CM | POA: Diagnosis not present

## 2023-02-13 DIAGNOSIS — F17210 Nicotine dependence, cigarettes, uncomplicated: Secondary | ICD-10-CM | POA: Insufficient documentation

## 2023-02-13 DIAGNOSIS — I700 Atherosclerosis of aorta: Secondary | ICD-10-CM | POA: Insufficient documentation

## 2023-02-13 DIAGNOSIS — J432 Centrilobular emphysema: Secondary | ICD-10-CM | POA: Diagnosis not present

## 2023-02-13 DIAGNOSIS — I2510 Atherosclerotic heart disease of native coronary artery without angina pectoris: Secondary | ICD-10-CM | POA: Insufficient documentation

## 2023-02-13 DIAGNOSIS — Z87891 Personal history of nicotine dependence: Secondary | ICD-10-CM

## 2023-02-16 ENCOUNTER — Other Ambulatory Visit: Payer: Self-pay | Admitting: Acute Care

## 2023-02-16 DIAGNOSIS — Z87891 Personal history of nicotine dependence: Secondary | ICD-10-CM

## 2023-02-16 DIAGNOSIS — Z122 Encounter for screening for malignant neoplasm of respiratory organs: Secondary | ICD-10-CM

## 2023-02-16 DIAGNOSIS — F17210 Nicotine dependence, cigarettes, uncomplicated: Secondary | ICD-10-CM

## 2023-03-01 ENCOUNTER — Encounter: Payer: Self-pay | Admitting: Pulmonary Disease

## 2023-03-01 ENCOUNTER — Encounter: Payer: Self-pay | Admitting: Medical

## 2023-03-01 DIAGNOSIS — J449 Chronic obstructive pulmonary disease, unspecified: Secondary | ICD-10-CM

## 2023-03-02 MED ORDER — ATORVASTATIN CALCIUM 10 MG PO TABS: 10.0000 mg | ORAL_TABLET | Freq: Every day | ORAL | 3 refills | Status: DC

## 2023-03-02 NOTE — Addendum Note (Signed)
Addended by: Gwenevere Abbot on: 03/02/2023 08:18 AM   Modules accepted: Orders

## 2023-03-02 NOTE — Addendum Note (Signed)
Addended by: Gwenevere Abbot on: 03/02/2023 08:17 AM   Modules accepted: Orders

## 2023-03-06 NOTE — Progress Notes (Unsigned)
Martin Santiago is a 72 y.o. male presents to the office today for Monthly B12 injection, per physician's orders. Original order: 09/05/22: " B12 deficiency with normal range level now but hx of chrons. Continue monthly injection."  Cyanocobalamin 1000 mg/ml IM was administered L deltoid today. Patient tolerated injection. Patient due for follow up labs/provider appt: Yes. Date due: 1 month, appt made Yes- pt can get next injection at 6 month f/u. Patient next injection due: 1 month, appt made Yes  Creft, Melton Alar L

## 2023-03-07 ENCOUNTER — Ambulatory Visit (INDEPENDENT_AMBULATORY_CARE_PROVIDER_SITE_OTHER): Payer: Medicare Other

## 2023-03-07 DIAGNOSIS — E538 Deficiency of other specified B group vitamins: Secondary | ICD-10-CM

## 2023-03-07 MED ORDER — CYANOCOBALAMIN 1000 MCG/ML IJ SOLN
1000.0000 ug | Freq: Once | INTRAMUSCULAR | Status: AC
Start: 2023-03-07 — End: 2023-03-07
  Administered 2023-03-07: 1000 ug via INTRAMUSCULAR

## 2023-03-16 NOTE — Telephone Encounter (Signed)
Dr. Francine Graven, the pt is asking about order for motorized scooter. Is this something you are okay with ordering for him? If so, he may need appt with mobility evaluation before ins will cover, thanks!

## 2023-04-06 ENCOUNTER — Ambulatory Visit: Payer: Medicare Other | Admitting: Medical

## 2023-04-06 VITALS — BP 128/72 | HR 58 | Resp 22 | Ht 66.0 in | Wt 128.2 lb

## 2023-04-06 DIAGNOSIS — E538 Deficiency of other specified B group vitamins: Secondary | ICD-10-CM | POA: Diagnosis not present

## 2023-04-06 DIAGNOSIS — H6121 Impacted cerumen, right ear: Secondary | ICD-10-CM

## 2023-04-06 DIAGNOSIS — J449 Chronic obstructive pulmonary disease, unspecified: Secondary | ICD-10-CM

## 2023-04-06 DIAGNOSIS — I10 Essential (primary) hypertension: Secondary | ICD-10-CM

## 2023-04-06 DIAGNOSIS — D649 Anemia, unspecified: Secondary | ICD-10-CM | POA: Diagnosis not present

## 2023-04-06 LAB — COMPREHENSIVE METABOLIC PANEL
ALT: 6 U/L (ref 0–53)
AST: 10 U/L (ref 0–37)
Albumin: 3.8 g/dL (ref 3.5–5.2)
Alkaline Phosphatase: 73 U/L (ref 39–117)
BUN: 13 mg/dL (ref 6–23)
CO2: 29 mEq/L (ref 19–32)
Calcium: 10.4 mg/dL (ref 8.4–10.5)
Chloride: 104 mEq/L (ref 96–112)
Creatinine, Ser: 0.95 mg/dL (ref 0.40–1.50)
GFR: 79.36 mL/min (ref >60.00)
Glucose, Bld: 102 mg/dL — ABNORMAL HIGH (ref 70–99)
Potassium: 4.5 mEq/L (ref 3.5–5.1)
Sodium: 139 mEq/L (ref 135–145)
Total Bilirubin: 0.7 mg/dL (ref 0.2–1.2)
Total Protein: 6.4 g/dL (ref 6.0–8.3)

## 2023-04-06 LAB — CBC WITH DIFFERENTIAL/PLATELET
Basophils Absolute: 0.1 10*3/uL (ref 0.0–0.1)
Basophils Relative: 1.5 % (ref 0.0–3.0)
Eosinophils Absolute: 0.2 10*3/uL (ref 0.0–0.7)
Eosinophils Relative: 3.3 % (ref 0.0–5.0)
HCT: 43.4 % (ref 39.0–52.0)
Hemoglobin: 14 g/dL (ref 13.0–17.0)
Lymphocytes Relative: 13 % (ref 12.0–46.0)
Lymphs Abs: 0.8 10*3/uL (ref 0.7–4.0)
MCHC: 32.4 g/dL (ref 30.0–36.0)
MCV: 85.3 fl (ref 78.0–100.0)
Monocytes Absolute: 0.7 10*3/uL (ref 0.1–1.0)
Monocytes Relative: 11.6 % (ref 3.0–12.0)
Neutro Abs: 4.1 10*3/uL (ref 1.4–7.7)
Neutrophils Relative %: 70.6 % (ref 43.0–77.0)
Platelets: 255 10*3/uL (ref 150.0–400.0)
RBC: 5.09 Mil/uL (ref 4.22–5.81)
RDW: 16.2 % — ABNORMAL HIGH (ref 11.5–15.5)
WBC: 5.8 10*3/uL (ref 4.0–10.5)

## 2023-04-06 LAB — VITAMIN B12: Vitamin B-12: 428 pg/mL (ref 211–911)

## 2023-04-06 MED ORDER — CYANOCOBALAMIN 1000 MCG/ML IJ SOLN
1000.0000 ug | Freq: Once | INTRAMUSCULAR | Status: AC
Start: 2023-04-06 — End: 2023-04-06
  Administered 2023-04-06: 1000 ug via INTRAMUSCULAR

## 2023-04-06 MED ORDER — METHYLPREDNISOLONE 4 MG PO TABS: ORAL_TABLET | ORAL | 0 refills | Status: DC

## 2023-04-06 NOTE — Progress Notes (Signed)
Subjective:    Patient ID: Martin Santiago, male    DOB: 1951/04/03, 72 y.o.   MRN: [id number]  HPI  Pt in for follow up.  Last AVS below.  "Anemia improve/resolved since used iron. Both cbc and iron level were normal. Placed referral to GI MD as you did have + stool card in past but you declined. If you change your mind let me know.   B12 deficiency with normal range level now but hx of chrons. Continue monthly injection.    Htn- well controlled on losartan and amlodipine.   Copd well controlled with breztri. Use albuterol as back up if needed.   For ED rx sildenafil. Rx advisement."  Last cbc showed no anemia. In past had low iron an blood work improved with iron. In past had referred pt to GI MD. He state he turned that down. He does not want to do again.   B12 deficiency-  last level was normal at 355.   High cholesterol- pt is on atorvastatin.  Copd- pt on breztri and albuterol. Pt is overdue for pulmonlogist referral. Pt 02 at tends to run low. 92% now. Last visit 90%. Appt coming up in August. Pt currently not on oxygen but pulmonlogist did mention would benefit at night. Pt decllned presently.  One week of rt ear feeling intermitently stopped up. Pt thinks is from wax. No ear pain.  Review of Systems  Constitutional:  Negative for chills, fatigue and fever.  HENT:  Negative for congestion, ear discharge and ear pain.   Respiratory:  Positive for shortness of breath. Negative for cough, chest tightness and wheezing.   Cardiovascular:  Negative for chest pain and palpitations.  Gastrointestinal:  Negative for abdominal pain and constipation.  Genitourinary:  Negative for dysuria.  Musculoskeletal:  Negative for back pain, joint swelling and neck pain.  Skin:  Negative for rash.  Neurological:  Negative for dizziness, syncope, numbness and headaches.  Hematological:  Negative for adenopathy. Does not bruise/bleed easily.  Psychiatric/Behavioral:  Negative for  behavioral problems and confusion.     Past Medical History:  Diagnosis Date   Colon polyps    COPD (chronic obstructive pulmonary disease) (HCC)    Crohn's disease (HCC)    Detached retina    Essential hypertension    Tropical sprue      Social History   Socioeconomic History   Marital status: Widowed    Spouse name: Not on file   Number of children: 0   Years of education: Not on file   Highest education level: Bachelor's degree (e.g., BA, AB, BS)  Occupational History   Occupation: retired, Counsellor, Engineer, agricultural: SELF EMPLOYED  Tobacco Use   Smoking status: Every Day    Current packs/day: 2.00    Average packs/day: 2.0 packs/day for 55.0 years (110.0 ttl pk-yrs)    Types: Cigarettes   Smokeless tobacco: Never   Tobacco comments:    Smoke 1/4 pack a day. Tay 04/19/22  Substance and Sexual Activity   Alcohol use: Yes    Alcohol/week: 0.0 standard drinks of alcohol    Comment: Rare, 3-4 per yr   Drug use: No    Comment: Remote marijuana   Sexual activity: Not Currently  Other Topics Concern   Not on file  Social History Narrative   Wife, Darl Pikes passed away in 12/13/21 from [place].   1 step-daughter who lives in [place].   Social Determinants of Health  Financial Resource Strain: Low Risk  (04/06/2023)   Overall Financial Resource Strain (CARDIA)    Difficulty of Paying Living Expenses: Not hard at all  Food Insecurity: No Food Insecurity (04/06/2023)   Hunger Vital Sign    Worried About Running Out of Food in the Last Year: Never true    Ran Out of Food in the Last Year: Never true  Transportation Needs: No Transportation Needs (04/06/2023)   PRAPARE - Administrator, Civil Service (Medical): No    Lack of Transportation (Non-Medical): No  Physical Activity: Unknown (04/06/2023)   Exercise Vital Sign    Days of Exercise per Week: 0 days    Minutes of Exercise per Session: Not on file  Stress: No Stress Concern Present  (04/06/2023)   [employer] of Occupational Health - Occupational Stress Questionnaire    Feeling of Stress : Only a little  Social Connections: Moderately Integrated (04/06/2023)   Social Connection and Isolation Panel [NHANES]    Frequency of Communication with Friends and Family: More than three times a week    Frequency of Social Gatherings with Friends and Family: More than three times a week    Attends Religious Services: More than 4 times per year    Active Member of [employer] or Organizations: Yes    Attends Banker Meetings: More than 4 times per year    Marital Status: Widowed  Intimate Partner Violence: Not At Risk (01/16/2023)   Humiliation, Afraid, Rape, and Kick questionnaire    Fear of Current or Ex-Partner: No    Emotionally Abused: No    Physically Abused: No    Sexually Abused: No    Past Surgical History:  Procedure Laterality Date   BOWEL RESECTION  05/1994   [facility] Lan:  ? Crohn's   COLONOSCOPY     Dr Clide Dales, [facility]:  Hx polyps. Cannot remmeber date   COLONOSCOPY N/A 12/03/2012   Procedure: COLONOSCOPY;  Surgeon: Corbin Ade, MD;  Location: [facility];  Service: Endoscopy;  Laterality: N/A;  7:30   INGUINAL HERNIA REPAIR Right    RETINAL DETACHMENT SURGERY  07/2006   RETINAL DETACHMENT SURGERY  09/2006   RETINAL DETACHMENT SURGERY  01/2007    Family History  Problem Relation Age of Onset   Colon cancer Maternal Aunt    Breast cancer Mother     Allergies  Allergen Reactions   Codeine Nausea And Vomiting and Other (See Comments)    Other reaction(s): Flushing (ALLERGY/intolerance) fainting    Current Outpatient Medications on File Prior to Visit  Medication Sig Dispense Refill   albuterol (PROVENTIL) (2.5 MG/3ML) 0.083% nebulizer solution Take 3 mLs (2.5 mg total) by nebulization every 4 (four) hours as needed for wheezing or shortness of breath. 540 mL 11   albuterol (VENTOLIN HFA) 108 (90 Base) MCG/ACT inhaler Inhale 2 puffs into the  lungs every 6 (six) hours as needed for wheezing or shortness of breath. 8 g 6   amLODipine (NORVASC) 10 MG tablet Take 1 tablet (10 mg total) by mouth daily. Reported on 02/25/2016 90 tablet 3   atorvastatin (LIPITOR) 10 MG tablet Take 1 tablet (10 mg total) by mouth daily. 30 tablet 3   BREZTRI AEROSPHERE 160-9-4.8 MCG/ACT AERO INHALE 2 PUFFS INTO THE LUNGS IN THE MORNING AND AT BEDTIME 10.7 g 5   COVID-19 mRNA bivalent vaccine, Moderna, (MODERNA COVID-19 BIVAL BOOSTER) 50 MCG/0.5ML injection Inject into the muscle. 0.5 mL 0   FEROSUL 325 (65 Fe)  MG tablet TAKE 1 TABLET BY MOUTH TWICE DAILY (Patient taking differently: Take 325 mg by mouth as needed.) 60 tablet 1   losartan (COZAAR) 100 MG tablet Take 1 tablet (100 mg total) by mouth daily. Reported on 02/25/2016 90 tablet 3   omeprazole (PRILOSEC) 20 MG capsule Take 1 capsule (20 mg total) by mouth daily. 90 capsule 2   sildenafil (REVATIO) 20 MG tablet 3-5 tab prior to sex.(Max use once in 24 hour period) 50 tablet 0   timolol (TIMOPTIC) 0.5 % ophthalmic solution Place 1 drop into the right eye 2 times daily.     No current facility-administered medications on file prior to visit.    BP 128/72   Pulse (!) 58   Resp (!) 22   Ht 5\' 6"  (1.676 m)   Wt 128 lb 3.2 oz (58.2 kg)   SpO2 92%   BMI 20.69 kg/m        Objective:   Physical Exam  General Mental Status- Alert. General Appearance- Not in acute distress.   Skin General: Color- Normal Color. Moisture- Normal Moisture.  Neck Carotid Arteries- Normal color. Moisture- Normal Moisture. No carotid bruits. No JVD.  Chest and Lung Exam Auscultation: Breath Sounds:-shallow with scattered expiatory wheeze bilaterally.   Cardiovascular Auscultation:Rythm- Regular. Murmurs & Other Heart Sounds:Auscultation of the heart reveals- No Murmurs.  Abdomen Inspection:-Inspeection Normal. Palpation/Percussion:Note:No mass. Palpation and Percussion of the abdomen reveal- Non Tender, Non  Distended + BS, no rebound or guarding.   Neurologic Cranial Nerve exam:- CN III-XII intact(No nystagmus), symmetric smile. Strength:- 5/5 equal and symmetric strength both upper and lower extremities.   Left ear canals only scant wax. Tm normal. Rt ear- wax obstructing view of tm. Post lavage wax not removed.     Assessment & Plan:   Patient Instructions  1. B12 deficiency Get level first and then give b12 IM. - B12  2. Anemia, unspecified type Follow labs. If again anemic and low iron would reconsider your declining of gi referral.  - CBC w/Diff - Iron, TIBC and Ferritin Panel  3. Hypertension, unspecified type well controlled on losartan and amlodipine. - Comp Met (CMET)  4. Chronic obstructive pulmonary disease, unspecified COPD type (HCC)  Continue breztri and albuterol. Keep pulmonologist appt. Stay indoors. Walk dog early am or lat pm. Heat may effect breathing. If worsening can start taper medrol and try to get you quicker with pulmonologist. Keep August appt.   5.for high cholesterol continue atorvastatin.  Follow up date to be determined after lab review.      Esperanza Richters, PA-C   Time spent with patient today was 50  minutes which consisted of chart revdiew, discussing diagnosis, work up treatment and documentation.

## 2023-04-06 NOTE — Patient Instructions (Addendum)
1. B12 deficiency Get level first and then give b12 IM. - B12  2. Anemia, unspecified type Follow labs. If again anemic and low iron would reconsider your declining of gi referral.  - CBC w/Diff - Iron, TIBC and Ferritin Panel  3. Hypertension, unspecified type well controlled on losartan and amlodipine. - Comp Met (CMET)  4. Chronic obstructive pulmonary disease, unspecified COPD type (HCC)  Continue breztri and albuterol. Keep pulmonologist appt. Stay indoors. Walk dog early am or lat pm. Heat may effect breathing. If worsening can start taper medrol and try to get you quicker with pulmonologist. Keep August appt.   5.for high cholesterol continue atorvastatin.  6. Cerumen impaction- pt agreed to lavage. Gave verbal consent. Wax could not be removed. Refer to ENT for lavage.  Follow up date to be determined after lab review.

## 2023-04-07 LAB — IRON,TIBC AND FERRITIN PANEL
%SAT: 17 % (calc) — ABNORMAL LOW (ref 20–48)
Ferritin: 26 ng/mL (ref 24–380)
Iron: 51 ug/dL (ref 50–180)
TIBC: 301 mcg/dL (calc) (ref 250–425)

## 2023-04-26 ENCOUNTER — Ambulatory Visit: Payer: Medicare Other | Admitting: Cardiovascular Disease

## 2023-04-26 DIAGNOSIS — H6123 Impacted cerumen, bilateral: Secondary | ICD-10-CM | POA: Diagnosis not present

## 2023-05-03 ENCOUNTER — Encounter: Payer: Self-pay | Admitting: Pulmonary Disease

## 2023-05-03 DIAGNOSIS — J449 Chronic obstructive pulmonary disease, unspecified: Secondary | ICD-10-CM

## 2023-05-04 NOTE — Telephone Encounter (Signed)
Please send in DME order for motorized scooter for patient under diagnosis of COPD.   Thanks, JD

## 2023-05-10 ENCOUNTER — Encounter: Payer: Self-pay | Admitting: Pulmonary Disease

## 2023-05-11 ENCOUNTER — Telehealth: Payer: Self-pay | Admitting: Pulmonary Disease

## 2023-05-11 NOTE — Telephone Encounter (Signed)
Mitch called in to get Dr. Francine Graven to approve RX with [facility]

## 2023-05-12 ENCOUNTER — Ambulatory Visit: Payer: Medicare Other | Attending: Cardiovascular Disease | Admitting: Cardiology

## 2023-05-12 ENCOUNTER — Encounter: Payer: Self-pay | Admitting: Cardiology

## 2023-05-12 VITALS — BP 142/84 | HR 75 | Ht 66.0 in | Wt 128.0 lb

## 2023-05-12 DIAGNOSIS — I2510 Atherosclerotic heart disease of native coronary artery without angina pectoris: Secondary | ICD-10-CM | POA: Insufficient documentation

## 2023-05-12 DIAGNOSIS — J432 Centrilobular emphysema: Secondary | ICD-10-CM | POA: Diagnosis not present

## 2023-05-12 DIAGNOSIS — Z720 Tobacco use: Secondary | ICD-10-CM | POA: Insufficient documentation

## 2023-05-12 DIAGNOSIS — E785 Hyperlipidemia, unspecified: Secondary | ICD-10-CM | POA: Diagnosis not present

## 2023-05-12 DIAGNOSIS — I10 Essential (primary) hypertension: Secondary | ICD-10-CM | POA: Diagnosis not present

## 2023-05-12 DIAGNOSIS — I2584 Coronary atherosclerosis due to calcified coronary lesion: Secondary | ICD-10-CM | POA: Insufficient documentation

## 2023-05-12 MED ORDER — ASPIRIN 81 MG PO TBEC: 81.0000 mg | DELAYED_RELEASE_TABLET | Freq: Every day | ORAL | Status: AC

## 2023-05-12 NOTE — Telephone Encounter (Signed)
Patient is calling wanted his information to be faxed over for a scooter. He has sent a MyChart message as well.Fax number and more information can be found in MyChart

## 2023-05-12 NOTE — Patient Instructions (Signed)
Medication Instructions:   START: Aspirin 81mg  enteric coated 1 tablet daily   Lab Work: 3rd Floor    [address]   Your physician recommends that you return for lab work in:  when fasting   You need to have labs done when you are fasting.  You can come Monday through Friday 8:00 am to 11:30 am and 1:00 to 4:00. You do not need to make an appointment as the order has already been placed. The labs you are going to have done are Lipid Panel    Testing/Procedures: Your physician has requested that you have an echocardiogram. Echocardiography is a painless test that uses sound waves to create images of your heart. It provides your doctor with information about the size and shape of your heart and how well your heart's chambers and valves are working. This procedure takes approximately one hour. There are no restrictions for this procedure. Please do NOT wear cologne, perfume, aftershave, or lotions (deodorant is allowed). Please arrive 15 minutes prior to your appointment time.  Your physician has requested that you have a carotid duplex. This test is an ultrasound of the carotid arteries in your neck. It looks at blood flow through these arteries that supply the brain with blood. Allow one hour for this exam. There are no restrictions or special instructions.     Follow-Up: At [facility], you and your health needs are our priority.  As part of our continuing mission to provide you with exceptional heart care, we have created designated Provider Care Teams.  These Care Teams include your primary Cardiologist (physician) and Advanced Practice Providers (APPs -  Physician Assistants and Nurse Practitioners) who all work together to provide you with the care you need, when you need it.  We recommend signing up for the patient portal called "MyChart".  Sign up information is provided on this After Visit Summary.  MyChart is used to connect with patients for Virtual Visits (Telemedicine).   Patients are able to view lab/test results, encounter notes, upcoming appointments, etc.  Non-urgent messages can be sent to your provider as well.   To learn more about what you can do with MyChart, go to [email or web address].    Your next appointment:   2 month(s)  The format for your next appointment:   In Person  Provider:   Gypsy Balsam, MD    Other Instructions NA

## 2023-05-12 NOTE — Progress Notes (Unsigned)
Cardiology Consultation:    Date:  05/12/2023   ID:  Martin Santiago, DOB [birth year], MRN [id number]  PCP:  Esperanza Richters, PA-C  Cardiologist:  Gypsy Balsam, MD   Referring MD: Marisue Brooklyn   Chief Complaint  Patient presents with   Abnormal CT    History of Present Illness:    Martin Santiago is a 72 y.o. male who is being seen today for the evaluation of calcification of the coronary artery at the request of Saguier, Ramon Dredge, [place].  Past medical history significant for essential hypertension, dyslipidemia, lifelong smoking, advanced COPD he was referred to [place] because CT of his chest done recently show advanced atherosclerosis with calcification of coronary arteries.  Interestingly in 2017 he was evaluated by Dr. Diona Browner and the reason for evaluation was calcification of the coronary arteries.  At that time he did have stress test done which showed no evidence of ischemia.  Overall he is doing poorly but the reason for that is his advanced COPD.  He said he had difficulty moving around or walking more because of shortness of breath in the matter-of-fact just recently got the electric scooter and moves around and the scooter he have dog that he tried to walk with but he had difficulty doing it.  Denies have any chest pain tightness squeezing pressure burning chest no palpitations dizziness swelling of lower extremities leading symptom is shortness of breath and cough.  Sadly he still continues to smoke.  He does not have family history of premature coronary artery disease, he is not on any special diet but he likes fish.  Past Medical History:  Diagnosis Date   Colon polyps    COPD (chronic obstructive pulmonary disease) (HCC)    Crohn's disease (HCC)    Detached retina    Essential hypertension    Tropical sprue     Past Surgical History:  Procedure Laterality Date   BOWEL RESECTION  05/1994   [facility]:  ? Crohn's   COLONOSCOPY     Dr Clide Dales, [facility]:  Hx polyps.  Cannot remmeber date   COLONOSCOPY N/A 12/03/2012   Procedure: COLONOSCOPY;  Surgeon: Corbin Ade, MD;  Location: [facility];  Service: Endoscopy;  Laterality: N/A;  7:30   INGUINAL HERNIA REPAIR Right    RETINAL DETACHMENT SURGERY  07/2006   RETINAL DETACHMENT SURGERY  09/2006   RETINAL DETACHMENT SURGERY  01/2007    Current Medications: Current Meds  Medication Sig   albuterol (PROVENTIL) (2.5 MG/3ML) 0.083% nebulizer solution Take 3 mLs (2.5 mg total) by nebulization every 4 (four) hours as needed for wheezing or shortness of breath.   albuterol (VENTOLIN HFA) 108 (90 Base) MCG/ACT inhaler Inhale 2 puffs into the lungs every 6 (six) hours as needed for wheezing or shortness of breath.   amLODipine (NORVASC) 10 MG tablet Take 1 tablet (10 mg total) by mouth daily. Reported on 02/25/2016   atorvastatin (LIPITOR) 10 MG tablet Take 1 tablet (10 mg total) by mouth daily.   BREZTRI AEROSPHERE 160-9-4.8 MCG/ACT AERO INHALE 2 PUFFS INTO THE LUNGS IN THE MORNING AND AT BEDTIME   COVID-19 mRNA bivalent vaccine, Moderna, (MODERNA COVID-19 BIVAL BOOSTER) 50 MCG/0.5ML injection Inject into the muscle. (Patient taking differently: Inject 0.5 mLs into the muscle once.)   losartan (COZAAR) 100 MG tablet Take 1 tablet (100 mg total) by mouth daily. Reported on 02/25/2016   omeprazole (PRILOSEC) 20 MG capsule Take 1 capsule (20 mg total) by mouth daily.  sildenafil (REVATIO) 20 MG tablet 3-5 tab prior to sex.(Max use once in 24 hour period) (Patient taking differently: Take 20 mg by mouth See admin instructions. 3-5 tab prior to sex.(Max use once in 24 hour period))   timolol (TIMOPTIC) 0.5 % ophthalmic solution Place 1 drop into both eyes daily.   [DISCONTINUED] FEROSUL 325 (65 Fe) MG tablet TAKE 1 TABLET BY MOUTH TWICE DAILY (Patient taking differently: Take 325 mg by mouth as needed (Iron deficiency).)     Allergies:   Codeine   Social History   Socioeconomic History   Marital status: Widowed     Spouse name: Not on file   Number of children: 0   Years of education: Not on file   Highest education level: Bachelor's degree (e.g., BA, AB, BS)  Occupational History   Occupation: retired, Counsellor, Engineer, agricultural: SELF EMPLOYED  Tobacco Use   Smoking status: Every Day    Current packs/day: 2.00    Average packs/day: 2.0 packs/day for 55.0 years (110.0 ttl pk-yrs)    Types: Cigarettes   Smokeless tobacco: Never   Tobacco comments:    Smoke 1/4 pack a day. Tay 04/19/22  Substance and Sexual Activity   Alcohol use: Yes    Alcohol/week: 0.0 standard drinks of alcohol    Comment: Rare, 3-4 per yr   Drug use: No    Comment: Remote marijuana   Sexual activity: Not Currently  Other Topics Concern   Not on file  Social History Narrative   Wife, Darl Pikes passed away in 2022-01-17 from [place].   1 step-daughter who lives in [place].   Social Determinants of Health   Financial Resource Strain: Low Risk  (04/06/2023)   Overall Financial Resource Strain (CARDIA)    Difficulty of Paying Living Expenses: Not hard at all  Food Insecurity: No Food Insecurity (04/06/2023)   Hunger Vital Sign    Worried About Running Out of Food in the Last Year: Never true    Ran Out of Food in the Last Year: Never true  Transportation Needs: No Transportation Needs (04/06/2023)   PRAPARE - Administrator, Civil Service (Medical): No    Lack of Transportation (Non-Medical): No  Physical Activity: Unknown (04/06/2023)   Exercise Vital Sign    Days of Exercise per Week: 0 days    Minutes of Exercise per Session: Not on file  Stress: No Stress Concern Present (04/06/2023)   [employer] of Occupational Health - Occupational Stress Questionnaire    Feeling of Stress : Only a little  Social Connections: Moderately Integrated (04/06/2023)   Social Connection and Isolation Panel [NHANES]    Frequency of Communication with Friends and Family: More than three times a week     Frequency of Social Gatherings with Friends and Family: More than three times a week    Attends Religious Services: More than 4 times per year    Active Member of [employer] or Organizations: Yes    Attends Banker Meetings: More than 4 times per year    Marital Status: Widowed     Family History: The patient'sfamily history includes Breast cancer in his mother; Colon cancer in his maternal aunt. ROS:   Please see the history of present illness.    All 14 point review of systems negative except as described per history of present illness.  EKGs/Labs/Other Studies Reviewed:    The following studies were reviewed today: CT of the chest showed extensive  atherosclerosis in extensive calcification of 3 coronary arteries  EKG:     Normal sinus rhythm normal P interval, PVCs, left axis deviation, nonspecific ST segment changes  Recent Labs: 04/06/2023: ALT 6; BUN 13; Creatinine, Ser 0.95; Hemoglobin 14.0; Platelets 255.0; Potassium 4.5; Sodium 139  Recent Lipid Panel    Component Value Date/Time   CHOL 180 08/30/2022 0941   TRIG 76.0 08/30/2022 0941   HDL 54.80 08/30/2022 0941   CHOLHDL 3 08/30/2022 0941   VLDL 15.2 08/30/2022 0941   LDLCALC 110 (H) 08/30/2022 0941    Physical Exam:    VS:  BP (!) 142/84 (BP Location: Left Arm, Patient Position: Sitting)   Pulse 75   Ht 5\' 6"  (1.676 m)   Wt 128 lb (58.1 kg)   SpO2 (!) 84%   BMI 20.66 kg/m     Wt Readings from Last 3 Encounters:  05/12/23 128 lb (58.1 kg)  04/06/23 128 lb 3.2 oz (58.2 kg)  10/24/22 125 lb 12.8 oz (57.1 kg)     GEN:  Well nourished, well developed in no acute distress HEENT: Normal NECK: No JVD; No carotid bruits LYMPHATICS: No lymphadenopathy CARDIAC: RRR, no murmurs, no rubs, no gallops RESPIRATORY: Poor air entry bilaterally, overinflated chest, diffuse wheezes ABDOMEN: Soft, non-tender, non-distended MUSCULOSKELETAL:  No edema; No deformity  SKIN: Warm and dry NEUROLOGIC:  Alert and  oriented x 3 PSYCHIATRIC:  Normal affect   ASSESSMENT:    1. Hypertension, unspecified type   2. Coronary artery calcification   3. Centrilobular emphysema (HCC)   4. Tobacco abuse   5. Dyslipidemia    PLAN:    In order of problems listed above:  Calcification of the coronary artery.  Difficult to find out if he is symptomatic from that point review.  He does not have any chest pain but his ability to exercise severely limited secondary to advanced COPD.  We discussed option was to having stress test done he said he would like to think it over in the meantime ask him to start taking 1 baby aspirin every single day. Dyspnea on exertion most likely related to advanced COPD but I will schedule him to have an echocardiogram and the purpose of this is to make sure he does not have any component of cardiomyopathy.  If he truly does have cardiomyopathy then we will be forced to do ischemia workup. Advanced atherosclerosis with multiple risk factors.  Will schedule him to have carotic ultrasounds make sure he does not have any significant carotic artery stenosis.  In the future we will do also screening for abdominal aortic aneurysm. Dyslipidemia recently started on Lipitor which I think is excellent idea LDL before Lipitor was started was 110.  I will ask him to have fasting lipid profile done. Smoking obviously huge problem we did talk about the fact that he need to quit but he is very adamant about the fact he does not want to do it he smokes since he was teenager he quit smoking only for 3 days in his life and he is it was absolutely miserable he is not interested in quitting   Medication Adjustments/Labs and Tests Ordered: Current medicines are reviewed at length with the patient today.  Concerns regarding medicines are outlined above.  Orders Placed This Encounter  Procedures   EKG 12-Lead   No orders of the defined types were placed in this encounter.   Signed, Georgeanna Lea, MD,  [facility]. 05/12/2023 3:09 PM    [facility]

## 2023-05-15 NOTE — Telephone Encounter (Signed)
Pt calling to get his order faxed over to [facility] on [address] FAX: [phone]

## 2023-05-17 ENCOUNTER — Encounter: Payer: Self-pay | Admitting: Pulmonary Disease

## 2023-05-17 DIAGNOSIS — J449 Chronic obstructive pulmonary disease, unspecified: Secondary | ICD-10-CM

## 2023-05-17 NOTE — Telephone Encounter (Signed)
Order has been sent

## 2023-05-18 ENCOUNTER — Other Ambulatory Visit: Payer: Self-pay | Admitting: Cardiology

## 2023-05-18 ENCOUNTER — Ambulatory Visit (HOSPITAL_COMMUNITY)
Admission: RE | Admit: 2023-05-18 | Discharge: 2023-05-18 | Disposition: A | Discharge status: Home / Self Care | Payer: Medicare Other | Source: Ambulatory Visit | Attending: Cardiology | Admitting: Cardiology

## 2023-05-18 DIAGNOSIS — I6529 Occlusion and stenosis of unspecified carotid artery: Secondary | ICD-10-CM

## 2023-05-18 DIAGNOSIS — I2510 Atherosclerotic heart disease of native coronary artery without angina pectoris: Secondary | ICD-10-CM

## 2023-05-18 DIAGNOSIS — I6523 Occlusion and stenosis of bilateral carotid arteries: Secondary | ICD-10-CM | POA: Diagnosis not present

## 2023-05-18 DIAGNOSIS — J432 Centrilobular emphysema: Secondary | ICD-10-CM

## 2023-05-18 DIAGNOSIS — E785 Hyperlipidemia, unspecified: Secondary | ICD-10-CM

## 2023-05-18 DIAGNOSIS — I10 Essential (primary) hypertension: Secondary | ICD-10-CM

## 2023-05-18 DIAGNOSIS — I2584 Coronary atherosclerosis due to calcified coronary lesion: Secondary | ICD-10-CM | POA: Insufficient documentation

## 2023-05-18 DIAGNOSIS — Z720 Tobacco use: Secondary | ICD-10-CM

## 2023-05-20 ENCOUNTER — Other Ambulatory Visit: Payer: Self-pay | Admitting: Medical

## 2023-05-23 ENCOUNTER — Encounter: Payer: Self-pay | Admitting: Pulmonary Disease

## 2023-05-23 DIAGNOSIS — J432 Centrilobular emphysema: Secondary | ICD-10-CM

## 2023-05-25 ENCOUNTER — Telehealth: Payer: Self-pay | Admitting: Pulmonary Disease

## 2023-05-25 ENCOUNTER — Telehealth: Payer: Self-pay

## 2023-05-25 DIAGNOSIS — J432 Centrilobular emphysema: Secondary | ICD-10-CM

## 2023-05-25 NOTE — Telephone Encounter (Signed)
Results reviewed with pt as per Dr. Munley's note.  Pt verbalized understanding and had no additional questions. Routed to PCP  

## 2023-05-26 MED ORDER — ALBUTEROL SULFATE (2.5 MG/3ML) 0.083% IN NEBU
2.5000 mg | INHALATION_SOLUTION | RESPIRATORY_TRACT | 11 refills | Status: AC
Start: 2023-05-26 — End: ?

## 2023-05-26 MED ORDER — ALBUTEROL SULFATE (2.5 MG/3ML) 0.083% IN NEBU
2.5000 mg | INHALATION_SOLUTION | RESPIRATORY_TRACT | 11 refills | Status: DC | PRN
Start: 2023-05-26 — End: 2023-05-26

## 2023-05-26 NOTE — Telephone Encounter (Signed)
Handled in another message

## 2023-05-26 NOTE — Telephone Encounter (Signed)
[facility]  They needed updated rx without the term "as needed" I provided new rx and they will mail out today  Pt aware and understands he is still to just use neb on as needed basisi  Nothing further needed

## 2023-05-28 ENCOUNTER — Encounter (HOSPITAL_BASED_OUTPATIENT_CLINIC_OR_DEPARTMENT_OTHER): Payer: Self-pay | Admitting: Emergency Medicine

## 2023-05-28 ENCOUNTER — Emergency Department (HOSPITAL_BASED_OUTPATIENT_CLINIC_OR_DEPARTMENT_OTHER): Payer: Medicare Other

## 2023-05-28 ENCOUNTER — Emergency Department (HOSPITAL_BASED_OUTPATIENT_CLINIC_OR_DEPARTMENT_OTHER)
Admission: EM | Admit: 2023-05-28 | Discharge: 2023-05-28 | Disposition: A | Discharge status: Home / Self Care | Payer: Medicare Other | Attending: Emergency Medicine | Admitting: Emergency Medicine

## 2023-05-28 DIAGNOSIS — R110 Nausea: Secondary | ICD-10-CM | POA: Diagnosis not present

## 2023-05-28 DIAGNOSIS — M549 Dorsalgia, unspecified: Secondary | ICD-10-CM | POA: Insufficient documentation

## 2023-05-28 DIAGNOSIS — X501XXA Overexertion from prolonged static or awkward postures, initial encounter: Secondary | ICD-10-CM | POA: Diagnosis not present

## 2023-05-28 DIAGNOSIS — M47812 Spondylosis without myelopathy or radiculopathy, cervical region: Secondary | ICD-10-CM | POA: Diagnosis not present

## 2023-05-28 DIAGNOSIS — R42 Dizziness and giddiness: Secondary | ICD-10-CM | POA: Diagnosis not present

## 2023-05-28 DIAGNOSIS — Z7982 Long term (current) use of aspirin: Secondary | ICD-10-CM | POA: Insufficient documentation

## 2023-05-28 DIAGNOSIS — S199XXA Unspecified injury of neck, initial encounter: Secondary | ICD-10-CM | POA: Diagnosis present

## 2023-05-28 DIAGNOSIS — S12091A Other nondisplaced fracture of first cervical vertebra, initial encounter for closed fracture: Secondary | ICD-10-CM | POA: Insufficient documentation

## 2023-05-28 DIAGNOSIS — S12000A Unspecified displaced fracture of first cervical vertebra, initial encounter for closed fracture: Secondary | ICD-10-CM

## 2023-05-28 DIAGNOSIS — R519 Headache, unspecified: Secondary | ICD-10-CM | POA: Diagnosis not present

## 2023-05-28 HISTORY — DX: Unspecified displaced fracture of first cervical vertebra, initial encounter for closed fracture: S12.000A

## 2023-05-28 MED ORDER — OXYCODONE-ACETAMINOPHEN 5-325 MG PO TABS
1.0000 | ORAL_TABLET | Freq: Once | ORAL | Status: AC
  Administered 2023-05-28: 1 via ORAL
  Filled 2023-05-28: qty 1

## 2023-05-28 MED ORDER — OXYCODONE-ACETAMINOPHEN 5-325 MG PO TABS: 1.0000 | ORAL_TABLET | Freq: Four times a day (QID) | ORAL | 0 refills | Status: DC | PRN

## 2023-05-28 MED ORDER — KETOROLAC TROMETHAMINE 30 MG/ML IJ SOLN
30.0000 mg | Freq: Once | INTRAMUSCULAR | Status: AC
  Administered 2023-05-28: 30 mg via INTRAMUSCULAR
  Filled 2023-05-28: qty 1

## 2023-05-28 MED ORDER — DEXAMETHASONE SODIUM PHOSPHATE 10 MG/ML IJ SOLN
10.0000 mg | Freq: Once | INTRAMUSCULAR | Status: AC
  Administered 2023-05-28: 10 mg via INTRAMUSCULAR
  Filled 2023-05-28: qty 1

## 2023-05-28 NOTE — ED Notes (Signed)
Patient transported to CT 

## 2023-05-28 NOTE — ED Provider Notes (Signed)
[place] [facility] Provider Note   CSN: [id number] Arrival date & time: 05/28/23  1420     History {Add pertinent medical, surgical, social history, OB history to HPI:1} Chief Complaint  Patient presents with   Neck Pain    Martin Santiago is a 72 y.o. male.  HPI Patient no prior history of neck surgery or chronic neck pain.  No injury.  Patient reports that he developed a severe pain in the back of his neck up toward the top 4 days of patient reports that got severely worse.  It is now spread out and encompasses more of the left paraspinous and trapezius area and also somewhat to the right in the upper neck.  Patient reports that he cannot turn his head from side-to-side or tilted forward or back.  He reports the pain is so severe he was unable to sleep last night.  He has tried extra strength Tylenol with very little relief.  Last night pain was so bad he felt like he was going to pass out with any dizzy episodes.  EMS was called and patient was assessed on the stable vital signs.  He was not transported at that time.  Patient reports he tried some aspirin and Tylenol for pain relief but was unable to sleep and cannot find any comfortable positions.  He does not have weakness or numbness into his arms or legs.  No fevers no chills.  No headache no visual changes.    Home Medications Prior to Admission medications   Medication Sig Start Date End Date Taking? Authorizing Provider  albuterol (PROVENTIL) (2.5 MG/3ML) 0.083% nebulizer solution Take 3 mLs (2.5 mg total) by nebulization every 4 (four) hours. 05/26/23   Martina Sinner, MD  albuterol (VENTOLIN HFA) 108 (90 Base) MCG/ACT inhaler Inhale 2 puffs into the lungs every 6 (six) hours as needed for wheezing or shortness of breath. 04/19/22   Martina Sinner, MD  amLODipine (NORVASC) 10 MG tablet Take 1 tablet (10 mg total) by mouth daily. Reported on 02/25/2016 11/23/22   Saguier, Ramon Dredge, PA-C   aspirin EC 81 MG tablet Take 1 tablet (81 mg total) by mouth daily. Swallow whole. 05/12/23   Georgeanna Lea, MD  atorvastatin (LIPITOR) 10 MG tablet Take 1 tablet (10 mg total) by mouth daily. 03/02/23   Saguier, Ramon Dredge, PA-C  BREZTRI AEROSPHERE 160-9-4.8 MCG/ACT AERO INHALE 2 PUFFS INTO THE LUNGS IN THE MORNING AND AT BEDTIME 08/29/22   Martina Sinner, MD  COVID-19 mRNA bivalent vaccine, Moderna, (MODERNA COVID-19 BIVAL BOOSTER) 50 MCG/0.5ML injection Inject into the muscle. Patient taking differently: Inject 0.5 mLs into the muscle once. 07/13/21   Judyann Munson, MD  losartan (COZAAR) 100 MG tablet Take 1 tablet (100 mg total) by mouth daily. Reported on 02/25/2016 11/23/22   Saguier, Ramon Dredge, PA-C  methylPREDNISolone (MEDROL) 4 MG tablet Standard 6 day taper Patient not taking: Reported on 05/12/2023 04/06/23   Saguier, Ramon Dredge, PA-C  omeprazole (PRILOSEC) 20 MG capsule TAKE 1 CAPSULE(20 MG) BY MOUTH DAILY 05/22/23   Saguier, Ramon Dredge, PA-C  sildenafil (REVATIO) 20 MG tablet 3-5 tab prior to sex.(Max use once in 24 hour period) Patient taking differently: Take 20 mg by mouth See admin instructions. 3-5 tab prior to sex.(Max use once in 24 hour period) 09/05/22   Saguier, Ramon Dredge, PA-C  timolol (TIMOPTIC) 0.5 % ophthalmic solution Place 1 drop into both eyes daily. 06/10/21   [provider]  Allergies    Codeine    Review of Systems   Review of Systems  Physical Exam Updated Vital Signs BP (!) 147/98   Pulse (!) 113   Temp 97.7 F (36.5 C) (Oral)   Resp 20   Ht 5\' 6"  (1.676 m)   Wt 58.1 kg   SpO2 92%   BMI 20.66 kg/m  Physical Exam Constitutional:      Comments: Alert nontoxic.  Well-nourished well-developed.  No respiratory distress.  HENT:     Nose: Nose normal.     Mouth/Throat:     Pharynx: Oropharynx is clear.  Eyes:     Extraocular Movements: Extraocular movements intact.     Comments: Teardrop pupil asymmetry on the left baseline.  Extraocular motions  intact.  Neck:     Comments: Point tenderness about the upper cervical spine midline.  Severely restricted range of motion for any lateral motion or flexion. Pulmonary:     Effort: Pulmonary effort is normal.     Breath sounds: Normal breath sounds.  Chest:     Chest wall: No tenderness.  Abdominal:     General: There is no distension.     Palpations: Abdomen is soft.     Tenderness: There is no abdominal tenderness. There is no guarding.  Musculoskeletal:        General: No swelling or tenderness. Normal range of motion.     Right lower leg: No edema.     Left lower leg: No edema.  Skin:    General: Skin is warm and dry.  Neurological:     General: No focal deficit present.     Mental Status: He is oriented to person, place, and time.     Motor: No weakness.     Coordination: Coordination normal.  Psychiatric:        Mood and Affect: Mood normal.     ED Results / Procedures / Treatments   Labs (all labs ordered are listed, but only abnormal results are displayed) Labs Reviewed - No data to display  EKG None  Radiology CT Cervical Spine Wo Contrast  Result Date: 05/28/2023 CLINICAL DATA:  Neck pain that started on the left side now spreading to the entire posterior neck. Pain increases with turning head side to side. Episode of dizziness. EXAM: CT CERVICAL SPINE WITHOUT CONTRAST TECHNIQUE: Multidetector CT imaging of the cervical spine was performed without intravenous contrast. Multiplanar CT image reconstructions were also generated. RADIATION DOSE REDUCTION: This exam was performed according to the departmental dose-optimization program which includes automated exposure control, adjustment of the mA and/or kV according to patient size and/or use of iterative reconstruction technique. COMPARISON:  None Available. FINDINGS: Alignment: Grade 1 anterolisthesis of C2 and C3 is presumed chronic. No evidence of traumatic listhesis. Skull base and vertebrae: No acute fracture of the  left anterior arch of C1 with 3 mm of widening (series 603/image 54 and 302/image 19). Anterior wedging of C5 is presumed chronic. Soft tissues and spinal canal: Prevertebral soft tissue swelling anterior to the dens compatible with hematoma in the setting of acute fracture. Question small epidural hematoma posterior to the dens versus pannus formation from degenerative change. No evidence of severe spinal canal narrowing. Disc levels: Multilevel advanced spondylosis, disc space height loss, degenerative endplate change. Advanced facet arthropathy on the left at C3-C4 and C4-C5. Uncovertebral spurring and facet arthropathy cause multilevel neural foraminal narrowing which is greatest on the left at C2-C3, on the left at C3-C4, and on the  right at C5-C6 where it is severe. Upper chest: Emphysema.  No acute abnormality. Other: Carotid calcification. IMPRESSION: Acute fracture of the left anterior arch of C1 with 3 mm of widening. Question small epidural hematoma versus pannus formation posterior to the dens. No severe spinal canal narrowing. Prevertebral soft tissue swelling anterior to the dens. Multilevel advanced spondylosis. Critical Value/emergent results were called by telephone at the time of interpretation on 05/28/2023 at 5:17 pm to provider [facility] , who verbally acknowledged these results. Electronically Signed   By: Minerva Fester M.D.   On: 05/28/2023 17:17    Procedures Procedures  {Document cardiac monitor, telemetry assessment procedure when appropriate:1}  Medications Ordered in ED Medications  dexamethasone (DECADRON) injection 10 mg (10 mg Intramuscular Given 05/28/23 1545)  ketorolac (TORADOL) 30 MG/ML injection 30 mg (30 mg Intramuscular Given 05/28/23 1546)  oxyCODONE-acetaminophen (PERCOCET/ROXICET) 5-325 MG per tablet 1 tablet (1 tablet Oral Given 05/28/23 1545)    ED Course/ Medical Decision Making/ A&P   {   Click here for ABCD2, HEART and other calculatorsREFRESH Note before  signing :1}                              Medical Decision Making Amount and/or Complexity of Data Reviewed Radiology: ordered.  Risk Prescription drug management.     {Document critical care time when appropriate:1} {Document review of labs and clinical decision tools ie heart score, Chads2Vasc2 etc:1}  {Document your independent review of radiology images, and any outside records:1} {Document your discussion with family members, caretakers, and with consultants:1} {Document social determinants of health affecting pt's care:1} {Document your decision making why or why not admission, treatments were needed:1} Final Clinical Impression(s) / ED Diagnoses Final diagnoses:  Other closed nondisplaced fracture of first cervical vertebra, initial encounter [facility])    Rx / DC Orders ED Discharge Orders          Ordered    Apply aspen cervicle collar        05/28/23 1712

## 2023-05-28 NOTE — ED Triage Notes (Addendum)
Pt c/o neck pain that started on the LT side Thurs, now has spread to entire posterior neck; no injury or fall; pain increases with turning head side to side; also reports episode of dizziness and "felt like I was going to pass out" last night

## 2023-05-28 NOTE — Discharge Instructions (Addendum)
1.  Keep your cervical collar on at all times.  You may take it off to change the pads.  Wear it at night while you are sleeping.  Avoid any turning of your head or brisk movements.  The collar from the emergency department did not come with an extra set of pads.  You may see if the medical supply store has extra pads for your collar, for showering, you could remove the pads and wrap a towel around your neck and reapply the collar while showering. 2.  You may take Percocet 1 to 2 tablets every 6-8 hours for pain.  You were given a Decadron shot in the emergency department.  This will be helping over the next several days with pain.  Percocet may cause constipation.  If you have any tendency towards constipation, get over-the-counter Colace or other stool softener intake per package instructions to avoid constipation. 3.  Call the neurosurgical office on the next business day to establish your follow-up appointment within about 2 weeks. 4.  Return to emergency department immediately if you have any weakness numbness or dysfunction of your arms or legs.

## 2023-05-28 NOTE — ED Notes (Signed)
Aspen collar applied

## 2023-05-30 ENCOUNTER — Other Ambulatory Visit: Payer: Self-pay | Admitting: Cardiology

## 2023-05-30 DIAGNOSIS — I10 Essential (primary) hypertension: Secondary | ICD-10-CM

## 2023-05-30 DIAGNOSIS — E785 Hyperlipidemia, unspecified: Secondary | ICD-10-CM

## 2023-05-30 DIAGNOSIS — Z720 Tobacco use: Secondary | ICD-10-CM

## 2023-05-30 DIAGNOSIS — R0609 Other forms of dyspnea: Secondary | ICD-10-CM

## 2023-05-30 DIAGNOSIS — J432 Centrilobular emphysema: Secondary | ICD-10-CM

## 2023-05-30 DIAGNOSIS — I2510 Atherosclerotic heart disease of native coronary artery without angina pectoris: Secondary | ICD-10-CM

## 2023-06-08 ENCOUNTER — Other Ambulatory Visit: Payer: Self-pay | Admitting: Pulmonary Disease

## 2023-06-08 DIAGNOSIS — J432 Centrilobular emphysema: Secondary | ICD-10-CM

## 2023-06-14 DIAGNOSIS — S12090A Other displaced fracture of first cervical vertebra, initial encounter for closed fracture: Secondary | ICD-10-CM | POA: Diagnosis not present

## 2023-06-15 ENCOUNTER — Ambulatory Visit (HOSPITAL_BASED_OUTPATIENT_CLINIC_OR_DEPARTMENT_OTHER)
Admission: RE | Admit: 2023-06-15 | Discharge: 2023-06-15 | Disposition: A | Discharge status: Home / Self Care | Payer: Medicare Other | Source: Ambulatory Visit | Attending: Cardiology | Admitting: Cardiology

## 2023-06-15 DIAGNOSIS — R0609 Other forms of dyspnea: Secondary | ICD-10-CM | POA: Diagnosis not present

## 2023-06-15 DIAGNOSIS — I2584 Coronary atherosclerosis due to calcified coronary lesion: Secondary | ICD-10-CM | POA: Insufficient documentation

## 2023-06-15 DIAGNOSIS — I2510 Atherosclerotic heart disease of native coronary artery without angina pectoris: Secondary | ICD-10-CM

## 2023-06-15 LAB — ECHOCARDIOGRAM COMPLETE
AR max vel: 1.97 cm2
AV Area VTI: 2 cm2
AV Area mean vel: 2.09 cm2
AV Mean grad: 3 mmHg
AV Peak grad: 5.8 mmHg
Ao pk vel: 1.2 m/s
Area-P 1/2: 2.56 cm2
Calc EF: 55 %
P 1/2 time: 1036 msec
S' Lateral: 3.2 cm
Single Plane A2C EF: 53.9 %
Single Plane A4C EF: 55.6 %

## 2023-06-20 ENCOUNTER — Encounter: Payer: Self-pay | Admitting: Pulmonary Disease

## 2023-06-20 ENCOUNTER — Ambulatory Visit: Payer: Medicare Other | Admitting: Pulmonary Disease

## 2023-06-20 VITALS — BP 122/74 | HR 65 | Ht 66.0 in | Wt 128.6 lb

## 2023-06-20 DIAGNOSIS — Z23 Encounter for immunization: Secondary | ICD-10-CM

## 2023-06-20 DIAGNOSIS — F17210 Nicotine dependence, cigarettes, uncomplicated: Secondary | ICD-10-CM | POA: Diagnosis not present

## 2023-06-20 DIAGNOSIS — J432 Centrilobular emphysema: Secondary | ICD-10-CM

## 2023-06-20 MED ORDER — ALBUTEROL SULFATE HFA 108 (90 BASE) MCG/ACT IN AERS
2.0000 | INHALATION_SPRAY | Freq: Four times a day (QID) | RESPIRATORY_TRACT | 11 refills | Status: DC | PRN
Start: 2023-06-20 — End: 2024-06-24

## 2023-06-20 MED ORDER — AZITHROMYCIN 250 MG PO TABS
ORAL_TABLET | ORAL | 0 refills | Status: DC
Start: 2023-06-20 — End: 2023-08-02

## 2023-06-20 NOTE — Progress Notes (Signed)
imm

## 2023-06-20 NOTE — Patient Instructions (Addendum)
Continue breztri inhaler 2 puffs twice daily - rinse mouth out after each use  Use albuterol inhaler 1-2 puffs every 4-6 hours as needed  Start steroid taper from you primary care and the Zpak antibiotic I sent in today  Follow up in 6 months

## 2023-06-20 NOTE — Progress Notes (Signed)
Synopsis: Referred in September 2022 for COPD  Subjective:   PATIENT ID: Martin Santiago GENDER: male DOB: [birth year], MRN: [id number]  HPI  Chief Complaint  Patient presents with   Follow-up    Pts states he is doing good, no concerns    Murray Damewood is a 73 year old male, daily smoker with hypertension and emphysema who returns to pulmonary clinic for COPD follow up.   They report that their primary care doctor prescribed prednisone, which they have not yet started. They describe their breathing as congested and have noticed an increase in the use of their albuterol inhaler.   The patient is currently using a Breztri inhaler twice a day and an albuterol inhaler as needed.   He is smoking 5-6 cigs daily. He received his motorized scooter after last visit.  OV 10/24/22 He continues to have dyspnea and decreased stamina. He has increased congestion over the past few days. He continues to smoke 4-6 cigarettes per day.   OV 04/19/22 PFTs today show severe obstruction, air trapping and severe diffusion defect.  He is now riding a motorized scooter around the grocery store due to dyspnea.   His wife passed away in 2023-01-04, he continues to grieve her loss. He is considering some travel but is concerned about traveling with a nebulizer machine.  OV 09/24/21 Overnight oximitry showed he spent 7 hours and 17 minutes with an SpO2 less than 88%. Patient was offered supplemental oxygen but declined at that time. He was started on breztri inhaler at last visit with good improvement in his shortness of breath. He is also using albuterol nebulizer twice daily with relief. He reports he is sleeping better since starting inhaler therapy.   OV 06/25/21 He has had increasing shortness of breath, cough and wheezing over recent months.  He has been on Bevespi inhaler over the last few years with good effect but he has been having more frequent breakthrough shortness of breath and wheezing recently.  He has  an as needed albuterol inhaler which he uses but does not find much relief with this inhaler.  He does not have a nebulizer machine at home.  His cough is productive with sputum production on a daily basis.  He continues to smoke daily and has smoked over the past 45 years.  He has a 60+ pack year smoking history.  High-resolution CT scan from 2017 shows extensive centrilobular emphysematous changes.  He has been seeing an ophthalmologist for retinal detachment but does not recall being treated for glaucoma.  Under his problem list there is listed secondary open-angle glaucoma of the left eye.  Past Medical History:  Diagnosis Date   Colon polyps    COPD (chronic obstructive pulmonary disease) (HCC)    Crohn's disease (HCC)    Detached retina    Essential hypertension    Tropical sprue      Family History  Problem Relation Age of Onset   Colon cancer Maternal Aunt    Breast cancer Mother      Social History   Socioeconomic History   Marital status: Widowed    Spouse name: Not on file   Number of children: 0   Years of education: Not on file   Highest education level: Bachelor's degree (e.g., BA, AB, BS)  Occupational History   Occupation: retired, Counsellor, church organist    Employer: SELF EMPLOYED  Tobacco Use   Smoking status: Every Day    Current packs/day: 2.00    Average  packs/day: 2.0 packs/day for 55.0 years (110.0 ttl pk-yrs)    Types: Cigarettes   Smokeless tobacco: Never   Tobacco comments:    Smoke 1/4 pack a day. Tay 04/19/22  Substance and Sexual Activity   Alcohol use: Yes    Alcohol/week: 0.0 standard drinks of alcohol    Comment: Rare, 3-4 per yr   Drug use: No    Comment: Remote marijuana   Sexual activity: Not Currently  Other Topics Concern   Not on file  Social History Narrative   Wife, Darl Pikes passed away in 2022/01/10 from [place].   1 step-daughter who lives in [place].   Social Determinants of Health   Financial Resource Strain:  Low Risk  (04/06/2023)   Overall Financial Resource Strain (CARDIA)    Difficulty of Paying Living Expenses: Not hard at all  Food Insecurity: No Food Insecurity (04/06/2023)   Hunger Vital Sign    Worried About Running Out of Food in the Last Year: Never true    Ran Out of Food in the Last Year: Never true  Transportation Needs: No Transportation Needs (04/06/2023)   PRAPARE - Administrator, Civil Service (Medical): No    Lack of Transportation (Non-Medical): No  Physical Activity: Unknown (04/06/2023)   Exercise Vital Sign    Days of Exercise per Week: 0 days    Minutes of Exercise per Session: Not on file  Stress: No Stress Concern Present (04/06/2023)   [employer] of Occupational Health - Occupational Stress Questionnaire    Feeling of Stress : Only a little  Social Connections: Moderately Integrated (04/06/2023)   Social Connection and Isolation Panel [NHANES]    Frequency of Communication with Friends and Family: More than three times a week    Frequency of Social Gatherings with Friends and Family: More than three times a week    Attends Religious Services: More than 4 times per year    Active Member of [employer] or Organizations: Yes    Attends Banker Meetings: More than 4 times per year    Marital Status: Widowed  Intimate Partner Violence: Not At Risk (01/16/2023)   Humiliation, Afraid, Rape, and Kick questionnaire    Fear of Current or Ex-Partner: No    Emotionally Abused: No    Physically Abused: No    Sexually Abused: No     Allergies  Allergen Reactions   Codeine Nausea And Vomiting and Other (See Comments)    Other reaction(s): Flushing (ALLERGY/intolerance) fainting     Outpatient Medications Prior to Visit  Medication Sig Dispense Refill   albuterol (PROVENTIL) (2.5 MG/3ML) 0.083% nebulizer solution Take 3 mLs (2.5 mg total) by nebulization every 4 (four) hours. 540 mL 11   amLODipine (NORVASC) 10 MG tablet Take 1 tablet (10 mg total)  by mouth daily. Reported on 02/25/2016 90 tablet 3   aspirin EC 81 MG tablet Take 1 tablet (81 mg total) by mouth daily. Swallow whole.     atorvastatin (LIPITOR) 10 MG tablet Take 1 tablet (10 mg total) by mouth daily. 30 tablet 3   BREZTRI AEROSPHERE 160-9-4.8 MCG/ACT AERO INHALE 2 PUFFS INTO THE LUNGS IN THE MORNING AND AT BEDTIME 10.7 g 5   COVID-19 mRNA bivalent vaccine, Moderna, (MODERNA COVID-19 BIVAL BOOSTER) 50 MCG/0.5ML injection Inject into the muscle. (Patient taking differently: Inject 0.5 mLs into the muscle once.) 0.5 mL 0   losartan (COZAAR) 100 MG tablet Take 1 tablet (100 mg total) by mouth daily. Reported  on 02/25/2016 90 tablet 3   omeprazole (PRILOSEC) 20 MG capsule TAKE 1 CAPSULE(20 MG) BY MOUTH DAILY 90 capsule 2   sildenafil (REVATIO) 20 MG tablet 3-5 tab prior to sex.(Max use once in 24 hour period) (Patient taking differently: Take 20 mg by mouth See admin instructions. 3-5 tab prior to sex.(Max use once in 24 hour period)) 50 tablet 0   timolol (TIMOPTIC) 0.5 % ophthalmic solution Place 1 drop into both eyes daily.     albuterol (VENTOLIN HFA) 108 (90 Base) MCG/ACT inhaler INHALE 2 PUFFS INTO THE LUNGS EVERY 6 HOURS AS NEEDED FOR WHEEZING OR SHORTNESS OF BREATH 18 g 0   methylPREDNISolone (MEDROL) 4 MG tablet Standard 6 day taper 21 tablet 0   oxyCODONE-acetaminophen (PERCOCET/ROXICET) 5-325 MG tablet Take 1-2 tablets by mouth every 6 (six) hours as needed for severe pain. 20 tablet 0   No facility-administered medications prior to visit.    Review of Systems  Constitutional:  Negative for chills, fever, malaise/fatigue and weight loss.  HENT:  Negative for congestion, sinus pain and sore throat.   Eyes: Negative.   Respiratory:  Positive for cough, sputum production, shortness of breath and wheezing. Negative for hemoptysis.   Cardiovascular:  Negative for chest pain, palpitations, orthopnea, claudication and leg swelling.  Gastrointestinal:  Negative for abdominal pain,  heartburn, nausea and vomiting.  Genitourinary: Negative.   Musculoskeletal:  Negative for joint pain and myalgias.  Skin:  Negative for rash.  Neurological:  Negative for weakness.  Endo/Heme/Allergies: Negative.   Psychiatric/Behavioral: Negative.     Objective:   Vitals:   06/20/23 1405  BP: 122/74  Pulse: 65  SpO2: 93%  Weight: 128 lb 9.6 oz (58.3 kg)  Height: 5\' 6"  (1.676 m)   Physical Exam Constitutional:      General: He is not in acute distress. HENT:     Head: Normocephalic and atraumatic.  Eyes:     Conjunctiva/sclera: Conjunctivae normal.  Cardiovascular:     Rate and Rhythm: Normal rate and regular rhythm.     Pulses: Normal pulses.     Heart sounds: Normal heart sounds. No murmur heard. Pulmonary:     Effort: Pulmonary effort is normal.     Breath sounds: Decreased air movement present. Wheezing (mild scattered) and rhonchi present. No rales.  Musculoskeletal:     Right lower leg: No edema.     Left lower leg: No edema.  Skin:    General: Skin is warm and dry.  Neurological:     General: No focal deficit present.     Mental Status: He is alert.    CBC    Component Value Date/Time   WBC 5.8 04/06/2023 1129   RBC 5.09 04/06/2023 1129   HGB 14.0 04/06/2023 1129   HCT 43.4 04/06/2023 1129   PLT 255.0 04/06/2023 1129   MCV 85.3 04/06/2023 1129   MCH 31.1 06/14/2017 0705   MCHC 32.4 04/06/2023 1129   RDW 16.2 (H) 04/06/2023 1129   LYMPHSABS 0.8 04/06/2023 1129   MONOABS 0.7 04/06/2023 1129   EOSABS 0.2 04/06/2023 1129   BASOSABS 0.1 04/06/2023 1129   Chest imaging: HRCT Chest 2017 1. Moderate to severe centrilobular emphysema. No evidence of interstitial lung disease. 2. Basilar subpleural nodules are likely subpleural lymph nodes.  PFT:    Latest Ref Rng & Units 04/19/2022   11:51 AM  PFT Results  FVC-Pre L 2.58   FVC-Predicted Pre % 69   FVC-Post L 2.74   FVC-Predicted  Post % 74   Pre FEV1/FVC % % 39   Post FEV1/FCV % % 39   FEV1-Pre  L 1.01   FEV1-Predicted Pre % 37   FEV1-Post L 1.07   DLCO uncorrected ml/min/mmHg 7.75   DLCO UNC% % 34   DLCO corrected ml/min/mmHg 7.75   DLCO COR %Predicted % 34   DLVA Predicted % 37   TLC L 7.91   TLC % Predicted % 127   RV % Predicted % 196     Assessment & Plan:   Centrilobular emphysema (HCC) - Plan: albuterol (VENTOLIN HFA) 108 (90 Base) MCG/ACT inhaler, azithromycin (ZITHROMAX) 250 MG tablet  Cigarette smoker  Discussion: Martin Santiago is a 72 year old male, daily smoker with hypertension and emphysema who returns to pulmonary clinic for COPD follow up.   Chronic Obstructive Pulmonary Disease (COPD) Increased wheezing and chest congestion. Previous courses of prednisone with variable response. Currently on Breztri (2 puffs twice daily) and Albuterol as needed. Patient continues to smoke 5-6 cigarettes per day. -Start prednisone taper (6-day course) already at home. -Refill Albuterol inhaler with multiple refills. -Consider future need for oxygen therapy as disease progresses.  Smoking Cessation Patient continues to smoke 5-6 cigarettes per day. Discussed the impact of continued smoking on COPD progression and overall health. -Encouraged to continue efforts to quit smoking.  General Health Maintenance -Administer influenza vaccine today. -Recommended to schedule COVID and RSV vaccines at local pharmacy.  Follow up in 6 months.   Melody Comas, MD [facility] Office: [phone]   Current Outpatient Medications:    albuterol (PROVENTIL) (2.5 MG/3ML) 0.083% nebulizer solution, Take 3 mLs (2.5 mg total) by nebulization every 4 (four) hours., Disp: 540 mL, Rfl: 11   amLODipine (NORVASC) 10 MG tablet, Take 1 tablet (10 mg total) by mouth daily. Reported on 02/25/2016, Disp: 90 tablet, Rfl: 3   aspirin EC 81 MG tablet, Take 1 tablet (81 mg total) by mouth daily. Swallow whole., Disp: , Rfl:    atorvastatin (LIPITOR) 10 MG tablet, Take 1 tablet  (10 mg total) by mouth daily., Disp: 30 tablet, Rfl: 3   azithromycin (ZITHROMAX) 250 MG tablet, Take as directed, Disp: 6 tablet, Rfl: 0   BREZTRI AEROSPHERE 160-9-4.8 MCG/ACT AERO, INHALE 2 PUFFS INTO THE LUNGS IN THE MORNING AND AT BEDTIME, Disp: 10.7 g, Rfl: 5   COVID-19 mRNA bivalent vaccine, Moderna, (MODERNA COVID-19 BIVAL BOOSTER) 50 MCG/0.5ML injection, Inject into the muscle. (Patient taking differently: Inject 0.5 mLs into the muscle once.), Disp: 0.5 mL, Rfl: 0   losartan (COZAAR) 100 MG tablet, Take 1 tablet (100 mg total) by mouth daily. Reported on 02/25/2016, Disp: 90 tablet, Rfl: 3   omeprazole (PRILOSEC) 20 MG capsule, TAKE 1 CAPSULE(20 MG) BY MOUTH DAILY, Disp: 90 capsule, Rfl: 2   sildenafil (REVATIO) 20 MG tablet, 3-5 tab prior to sex.(Max use once in 24 hour period) (Patient taking differently: Take 20 mg by mouth See admin instructions. 3-5 tab prior to sex.(Max use once in 24 hour period)), Disp: 50 tablet, Rfl: 0   timolol (TIMOPTIC) 0.5 % ophthalmic solution, Place 1 drop into both eyes daily., Disp: , Rfl:    albuterol (VENTOLIN HFA) 108 (90 Base) MCG/ACT inhaler, Inhale 2 puffs into the lungs every 6 (six) hours as needed for wheezing or shortness of breath., Disp: 18 g, Rfl: 11

## 2023-06-22 ENCOUNTER — Telehealth: Payer: Self-pay

## 2023-06-22 NOTE — Telephone Encounter (Signed)
Left message on My Chart with Echo results per Dr. Vanetta Shawl note. Routed to PCP.

## 2023-06-23 ENCOUNTER — Telehealth: Payer: Self-pay

## 2023-06-23 NOTE — Telephone Encounter (Signed)
Pt viewed results in My Chart per Dr. Krasowski's note. Routed to PCP.  

## 2023-06-27 ENCOUNTER — Encounter: Payer: Self-pay | Admitting: Medical

## 2023-06-28 ENCOUNTER — Other Ambulatory Visit: Payer: Self-pay | Admitting: Medical

## 2023-06-29 DIAGNOSIS — S12090A Other displaced fracture of first cervical vertebra, initial encounter for closed fracture: Secondary | ICD-10-CM | POA: Diagnosis not present

## 2023-08-02 ENCOUNTER — Ambulatory Visit: Payer: Medicare Other | Attending: Cardiology | Admitting: Cardiology

## 2023-08-02 ENCOUNTER — Encounter: Payer: Self-pay | Admitting: Cardiology

## 2023-08-02 VITALS — BP 118/82 | HR 88 | Ht 66.0 in | Wt 128.0 lb

## 2023-08-02 DIAGNOSIS — I2510 Atherosclerotic heart disease of native coronary artery without angina pectoris: Secondary | ICD-10-CM

## 2023-08-02 DIAGNOSIS — S12000A Unspecified displaced fracture of first cervical vertebra, initial encounter for closed fracture: Secondary | ICD-10-CM | POA: Insufficient documentation

## 2023-08-02 DIAGNOSIS — M40209 Unspecified kyphosis, site unspecified: Secondary | ICD-10-CM | POA: Insufficient documentation

## 2023-08-02 DIAGNOSIS — E785 Hyperlipidemia, unspecified: Secondary | ICD-10-CM

## 2023-08-02 DIAGNOSIS — Z720 Tobacco use: Secondary | ICD-10-CM | POA: Diagnosis not present

## 2023-08-02 DIAGNOSIS — I10 Essential (primary) hypertension: Secondary | ICD-10-CM

## 2023-08-02 MED ORDER — ATORVASTATIN CALCIUM 20 MG PO TABS: 20.0000 mg | ORAL_TABLET | Freq: Every day | ORAL | 3 refills | Status: DC

## 2023-08-02 NOTE — Addendum Note (Signed)
Addended by: Baldo Ash D on: 08/02/2023 03:45 PM   Modules accepted: Orders

## 2023-08-02 NOTE — Patient Instructions (Addendum)
Medication Instructions:   INCREASE: Lipitor 20mg   tablet daily- you can double your current dose- your next refill will reflect your new dose   Lab Work: 3rd Floor   [address]  Your physician recommends that you return for lab work in:  6 weeks after starting Lipitor 20mg  You need to have labs done when you are fasting.  You can come Monday through Friday 8:00 am to 11:30AM and 1:00 to 4:00. You do not need to make an appointment as the order has already been placed. The labs you are going to have done are  Lipid, AST, ALT    Testing/Procedures: Your physician has requested that you have a lexiscan myoview. For further information please visit [email or web address] Please follow instruction sheet, as given.  The test will take approximately 3 to 4 hours to complete; you may bring reading material.  If someone comes with you to your appointment, they will need to remain in the main lobby due to limited space in the testing area.    How to prepare for your Myocardial Perfusion Test: Do not eat or drink 3 hours prior to your test, except you may have water. Do not consume products containing caffeine (regular or decaffeinated) 12 hours prior to your test. (ex: coffee, chocolate, sodas, tea). Do bring a list of your current medications with you.  If not listed below, you may take your medications as normal. Do wear comfortable clothes (no dresses or overalls) and walking shoes, tennis shoes preferred (No heels or open toe shoes are allowed). Do NOT wear cologne, perfume, aftershave, or lotions (deodorant is allowed). If these instructions are not followed, your test will have to be rescheduled.     Follow-Up: At [facility], you and your health needs are our priority.  As part of our continuing mission to provide you with exceptional heart care, we have created designated Provider Care Teams.  These Care Teams include your primary Cardiologist (physician) and Advanced Practice Providers  (APPs -  Physician Assistants and Nurse Practitioners) who all work together to provide you with the care you need, when you need it.  We recommend signing up for the patient portal called "MyChart".  Sign up information is provided on this After Visit Summary.  MyChart is used to connect with patients for Virtual Visits (Telemedicine).  Patients are able to view lab/test results, encounter notes, upcoming appointments, etc.  Non-urgent messages can be sent to your provider as well.   To learn more about what you can do with MyChart, go to [email or web address].    Your next appointment:   6 month(s)  The format for your next appointment:   In Person  Provider:   Gypsy Balsam, MD    Other Instructions NA

## 2023-08-02 NOTE — Progress Notes (Signed)
Cardiology Office Note:    Date:  08/02/2023   ID:  Martin Santiago, DOB 12-18-1950, MRN [id number]  PCP:  Esperanza Richters, PA-C  Cardiologist:  Gypsy Balsam, MD    Referring MD: Marisue Brooklyn   Chief Complaint  Patient presents with   Follow-up    History of Present Illness:    Martin Santiago is a 72 y.o. male with past medical history significant for chronic smoking, advanced COPD, dyslipidemia, essential hypertension.  He was referred to [place] because he will be noticed to have severe calcification of the coronary arteries on the CT of his chest.  Look like he got evaluation of coronary artery disease done in 2017 by Dr. Diona Browner and that showed no evidence of significant obstruction.  He is doing fair.  Shortness of breath seems to be the biggest problem also recently he started having neck pain he was find to have some chronic fracture over there.  Denies have any typical chest pain tightness squeezing pressure burning chest but overall ability to exercise is limited because of his advanced COPD.  Past Medical History:  Diagnosis Date   Colon polyps    COPD (chronic obstructive pulmonary disease) (HCC)    Crohn's disease (HCC)    Detached retina    Essential hypertension    Tropical sprue     Past Surgical History:  Procedure Laterality Date   BOWEL RESECTION  05/1994   [facility]:  ? Crohn's   COLONOSCOPY     Dr Clide Dales, [facility]:  Hx polyps. Cannot remmeber date   COLONOSCOPY N/A 12/03/2012   Procedure: COLONOSCOPY;  Surgeon: Corbin Ade, MD;  Location: [facility];  Service: Endoscopy;  Laterality: N/A;  7:30   INGUINAL HERNIA REPAIR Right    RETINAL DETACHMENT SURGERY  07/2006   RETINAL DETACHMENT SURGERY  09/2006   RETINAL DETACHMENT SURGERY  01/2007    Current Medications: Current Meds  Medication Sig   albuterol (PROVENTIL) (2.5 MG/3ML) 0.083% nebulizer solution Take 3 mLs (2.5 mg total) by nebulization every 4 (four) hours.   albuterol (VENTOLIN HFA)  108 (90 Base) MCG/ACT inhaler Inhale 2 puffs into the lungs every 6 (six) hours as needed for wheezing or shortness of breath.   amLODipine (NORVASC) 10 MG tablet Take 1 tablet (10 mg total) by mouth daily. Reported on 02/25/2016   aspirin EC 81 MG tablet Take 1 tablet (81 mg total) by mouth daily. Swallow whole.   atorvastatin (LIPITOR) 10 MG tablet TAKE 1 TABLET(10 MG) BY MOUTH DAILY (Patient taking differently: Take 10 mg by mouth daily.)   BREZTRI AEROSPHERE 160-9-4.8 MCG/ACT AERO INHALE 2 PUFFS INTO THE LUNGS IN THE MORNING AND AT BEDTIME   COVID-19 mRNA bivalent vaccine, Moderna, (MODERNA COVID-19 BIVAL BOOSTER) 50 MCG/0.5ML injection Inject into the muscle. (Patient taking differently: Inject 0.5 mLs into the muscle once.)   losartan (COZAAR) 100 MG tablet Take 1 tablet (100 mg total) by mouth daily. Reported on 02/25/2016   omeprazole (PRILOSEC) 20 MG capsule TAKE 1 CAPSULE(20 MG) BY MOUTH DAILY (Patient taking differently: Take 20 mg by mouth daily.)   sildenafil (REVATIO) 20 MG tablet 3-5 tab prior to sex.(Max use once in 24 hour period) (Patient taking differently: Take 20 mg by mouth See admin instructions. 3-5 tab prior to sex.(Max use once in 24 hour period))   timolol (TIMOPTIC) 0.5 % ophthalmic solution Place 1 drop into both eyes daily.   [DISCONTINUED] azithromycin (ZITHROMAX) 250 MG tablet Take as directed (Patient  taking differently: Take 250 mg by mouth See admin instructions. Take as directed)     Allergies:   Codeine   Social History   Socioeconomic History   Marital status: Widowed    Spouse name: Not on file   Number of children: 0   Years of education: Not on file   Highest education level: Bachelor's degree (e.g., BA, AB, BS)  Occupational History   Occupation: retired, Counsellor, Engineer, agricultural: SELF EMPLOYED  Tobacco Use   Smoking status: Every Day    Current packs/day: 2.00    Average packs/day: 2.0 packs/day for 55.0 years (110.0 ttl pk-yrs)     Types: Cigarettes   Smokeless tobacco: Never   Tobacco comments:    Smoke 1/4 pack a day. Tay 04/19/22  Substance and Sexual Activity   Alcohol use: Yes    Alcohol/week: 0.0 standard drinks of alcohol    Comment: Rare, 3-4 per yr   Drug use: No    Comment: Remote marijuana   Sexual activity: Not Currently  Other Topics Concern   Not on file  Social History Narrative   Wife, Darl Pikes passed away in 08-Dec-2021 from [place].   1 step-daughter who lives in [place].   Social Determinants of Health   Financial Resource Strain: Low Risk  (04/06/2023)   Overall Financial Resource Strain (CARDIA)    Difficulty of Paying Living Expenses: Not hard at all  Food Insecurity: No Food Insecurity (04/06/2023)   Hunger Vital Sign    Worried About Running Out of Food in the Last Year: Never true    Ran Out of Food in the Last Year: Never true  Transportation Needs: No Transportation Needs (04/06/2023)   PRAPARE - Administrator, Civil Service (Medical): No    Lack of Transportation (Non-Medical): No  Physical Activity: Unknown (04/06/2023)   Exercise Vital Sign    Days of Exercise per Week: 0 days    Minutes of Exercise per Session: Not on file  Stress: No Stress Concern Present (04/06/2023)   [employer] of Occupational Health - Occupational Stress Questionnaire    Feeling of Stress : Only a little  Social Connections: Moderately Integrated (04/06/2023)   Social Connection and Isolation Panel [NHANES]    Frequency of Communication with Friends and Family: More than three times a week    Frequency of Social Gatherings with Friends and Family: More than three times a week    Attends Religious Services: More than 4 times per year    Active Member of [employer] or Organizations: Yes    Attends Banker Meetings: More than 4 times per year    Marital Status: Widowed     Family History: The patient's family history includes Breast cancer in his mother; Colon cancer in  his maternal aunt. ROS:   Please see the history of present illness.    All 14 point review of systems negative except as described per history of present illness  EKGs/Labs/Other Studies Reviewed:         Recent Labs: 04/06/2023: ALT 6; BUN 13; Creatinine, Ser 0.95; Hemoglobin 14.0; Platelets 255.0; Potassium 4.5; Sodium 139  Recent Lipid Panel    Component Value Date/Time   CHOL 180 08/30/2022 0941   TRIG 76.0 08/30/2022 0941   HDL 54.80 08/30/2022 0941   CHOLHDL 3 08/30/2022 0941   VLDL 15.2 08/30/2022 0941   LDLCALC 110 (H) 08/30/2022 0941    Physical Exam:    VS:  BP 118/82 (BP Location: Left Arm, Patient Position: Sitting)   Pulse 88   Ht 5\' 6"  (1.676 m)   Wt 128 lb (58.1 kg)   SpO2 90%   BMI 20.66 kg/m     Wt Readings from Last 3 Encounters:  08/02/23 128 lb (58.1 kg)  06/20/23 128 lb 9.6 oz (58.3 kg)  05/28/23 128 lb (58.1 kg)     GEN:  Well nourished, well developed in no acute distress HEENT: Normal NECK: No JVD; No carotid bruits LYMPHATICS: No lymphadenopathy CARDIAC: RRR, no murmurs, no rubs, no gallops RESPIRATORY:  Clear to auscultation without rales, wheezing or rhonchi  ABDOMEN: Soft, non-tender, non-distended MUSCULOSKELETAL:  No edema; No deformity  SKIN: Warm and dry LOWER EXTREMITIES: no swelling NEUROLOGIC:  Alert and oriented x 3 PSYCHIATRIC:  Normal affect   ASSESSMENT:    1. Coronary artery calcification   2. Primary hypertension   3. Dyslipidemia   4. Tobacco abuse    PLAN:    In order of problems listed above:  Coronary calcifications I spoke again to him about potentially doing stress test.  He agreed to proceed with [facility] which we will make arrangements for.  In the meantime we will continue antiplatelets therapy and rest of the medication. Dyslipidemia I did review K PN which show me his LDL of 110 HDL 54.  He is taking Lipitor 10 asking to increase to 20 we will recheck his fasting lipid profile. Tobacco abuse still  ongoing problem he understands an issue however look like he will not be able to easily quit.   Medication Adjustments/Labs and Tests Ordered: Current medicines are reviewed at length with the patient today.  Concerns regarding medicines are outlined above.  No orders of the defined types were placed in this encounter.  Medication changes: No orders of the defined types were placed in this encounter.   Signed, Georgeanna Lea, MD, [facility] 08/02/2023 3:30 PM    [facility]

## 2023-08-02 NOTE — Addendum Note (Signed)
Addended by: Baldo Ash D on: 08/02/2023 04:04 PM   Modules accepted: Orders

## 2023-08-27 ENCOUNTER — Telehealth: Payer: Self-pay | Admitting: Medical

## 2023-08-27 NOTE — Telephone Encounter (Signed)
I have PT referral form that [facility] wants me to sign off on. On review of my notes in past did not mention fall risk or gait instability. Not sure what is going on with pt. Will you get pt scheduled for follow up. Will assess then place the referral to PT.

## 2023-08-28 NOTE — Telephone Encounter (Signed)
Pt is seeing neurosurgery for a cervical fracture as of 05/2023, do you want me to fax the paperwork to their office for the referral?

## 2023-08-29 NOTE — Telephone Encounter (Signed)
Need form to fax to their office

## 2023-08-30 NOTE — Telephone Encounter (Signed)
Faxed to [facility] [phone]

## 2023-09-04 ENCOUNTER — Telehealth: Payer: Self-pay | Admitting: Medical

## 2023-09-04 NOTE — Telephone Encounter (Signed)
Marie with [employer] called to confirm whether we received a form because it was sent on 12/2 and she has not received anything back. Did not see the document in the chart or providers [address]. Requested it to be faxed again. Please call Hilda Lias at [phone] to advise status if we do already have it.

## 2023-09-04 NOTE — Telephone Encounter (Signed)
Form faxed to [facility] and also to [employer]

## 2023-09-12 DIAGNOSIS — R2689 Other abnormalities of gait and mobility: Secondary | ICD-10-CM | POA: Diagnosis not present

## 2023-09-12 DIAGNOSIS — R278 Other lack of coordination: Secondary | ICD-10-CM | POA: Diagnosis not present

## 2023-09-12 DIAGNOSIS — R2681 Unsteadiness on feet: Secondary | ICD-10-CM | POA: Diagnosis not present

## 2023-09-12 DIAGNOSIS — M158 Other polyosteoarthritis: Secondary | ICD-10-CM | POA: Diagnosis not present

## 2023-09-12 DIAGNOSIS — R296 Repeated falls: Secondary | ICD-10-CM | POA: Diagnosis not present

## 2023-09-12 DIAGNOSIS — M62511 Muscle wasting and atrophy, not elsewhere classified, right shoulder: Secondary | ICD-10-CM | POA: Diagnosis not present

## 2023-09-12 DIAGNOSIS — M62512 Muscle wasting and atrophy, not elsewhere classified, left shoulder: Secondary | ICD-10-CM | POA: Diagnosis not present

## 2023-09-13 ENCOUNTER — Telehealth: Payer: Self-pay

## 2023-09-13 DIAGNOSIS — R296 Repeated falls: Secondary | ICD-10-CM | POA: Diagnosis not present

## 2023-09-13 DIAGNOSIS — M158 Other polyosteoarthritis: Secondary | ICD-10-CM | POA: Diagnosis not present

## 2023-09-13 DIAGNOSIS — R2681 Unsteadiness on feet: Secondary | ICD-10-CM | POA: Diagnosis not present

## 2023-09-13 DIAGNOSIS — R278 Other lack of coordination: Secondary | ICD-10-CM | POA: Diagnosis not present

## 2023-09-13 DIAGNOSIS — M62511 Muscle wasting and atrophy, not elsewhere classified, right shoulder: Secondary | ICD-10-CM | POA: Diagnosis not present

## 2023-09-13 DIAGNOSIS — R2689 Other abnormalities of gait and mobility: Secondary | ICD-10-CM | POA: Diagnosis not present

## 2023-09-13 NOTE — Telephone Encounter (Signed)
Error with CRM call

## 2023-09-13 NOTE — Telephone Encounter (Signed)
Pt called & lvm to return call to schedule an appt

## 2023-09-13 NOTE — Telephone Encounter (Signed)
Copied from CRM [id number]. Topic: Clinical - Medical Advice >> Sep 13, 2023 10:09 AM Fonda Kinder J wrote: Reason for CRM: Pt called in with his Physical therapist wanting to notify his PCP that his blood pressure has an at risk reading for 142/101. They wanted to speak with someone about this and is seeking advice. Pt is requesting a follow up call [phone]. Pt did not want to schedule an appointment with but will do so if the doctor recommends

## 2023-09-14 DIAGNOSIS — R2681 Unsteadiness on feet: Secondary | ICD-10-CM | POA: Diagnosis not present

## 2023-09-14 DIAGNOSIS — M158 Other polyosteoarthritis: Secondary | ICD-10-CM | POA: Diagnosis not present

## 2023-09-14 DIAGNOSIS — M62511 Muscle wasting and atrophy, not elsewhere classified, right shoulder: Secondary | ICD-10-CM | POA: Diagnosis not present

## 2023-09-14 DIAGNOSIS — R2689 Other abnormalities of gait and mobility: Secondary | ICD-10-CM | POA: Diagnosis not present

## 2023-09-14 DIAGNOSIS — R296 Repeated falls: Secondary | ICD-10-CM | POA: Diagnosis not present

## 2023-09-14 DIAGNOSIS — R278 Other lack of coordination: Secondary | ICD-10-CM | POA: Diagnosis not present

## 2023-09-14 NOTE — Telephone Encounter (Signed)
Spoke with pt , he stated he hasn't been having any issues with his BP just that one high reading with the PT at his living facility , pt stated its hard for him to come to [facility] since he is in the facility for the winter and would rather not try to find a ride due to no bp complications made him aware if any issues occur to notify primary care , cardiology and to also seek emergent care. Pt verbalized understanding.

## 2023-09-18 ENCOUNTER — Telehealth: Payer: Self-pay

## 2023-09-18 DIAGNOSIS — R2689 Other abnormalities of gait and mobility: Secondary | ICD-10-CM | POA: Diagnosis not present

## 2023-09-18 DIAGNOSIS — R278 Other lack of coordination: Secondary | ICD-10-CM | POA: Diagnosis not present

## 2023-09-18 DIAGNOSIS — R2681 Unsteadiness on feet: Secondary | ICD-10-CM | POA: Diagnosis not present

## 2023-09-18 DIAGNOSIS — R296 Repeated falls: Secondary | ICD-10-CM | POA: Diagnosis not present

## 2023-09-18 DIAGNOSIS — M158 Other polyosteoarthritis: Secondary | ICD-10-CM | POA: Diagnosis not present

## 2023-09-18 DIAGNOSIS — M62511 Muscle wasting and atrophy, not elsewhere classified, right shoulder: Secondary | ICD-10-CM | POA: Diagnosis not present

## 2023-09-18 NOTE — Telephone Encounter (Signed)
Copied from CRM [id number]. Topic: Clinical - Medical Advice >> Sep 18, 2023  2:22 PM Theodis Sato wrote: Reason for CRM: Hilda Lias from [facility] ([facility]'s [facility] center)  is calling to inquire about PT having an oxygen trial- Call [phone]

## 2023-09-19 ENCOUNTER — Other Ambulatory Visit (HOSPITAL_BASED_OUTPATIENT_CLINIC_OR_DEPARTMENT_OTHER): Payer: Self-pay

## 2023-09-22 DIAGNOSIS — R278 Other lack of coordination: Secondary | ICD-10-CM | POA: Diagnosis not present

## 2023-09-22 DIAGNOSIS — R296 Repeated falls: Secondary | ICD-10-CM | POA: Diagnosis not present

## 2023-09-22 DIAGNOSIS — R2681 Unsteadiness on feet: Secondary | ICD-10-CM | POA: Diagnosis not present

## 2023-09-22 DIAGNOSIS — M158 Other polyosteoarthritis: Secondary | ICD-10-CM | POA: Diagnosis not present

## 2023-09-22 DIAGNOSIS — M62511 Muscle wasting and atrophy, not elsewhere classified, right shoulder: Secondary | ICD-10-CM | POA: Diagnosis not present

## 2023-09-22 DIAGNOSIS — R2689 Other abnormalities of gait and mobility: Secondary | ICD-10-CM | POA: Diagnosis not present

## 2023-09-26 ENCOUNTER — Telehealth: Payer: Self-pay | Admitting: Medical

## 2023-09-26 DIAGNOSIS — M158 Other polyosteoarthritis: Secondary | ICD-10-CM | POA: Diagnosis not present

## 2023-09-26 DIAGNOSIS — R2681 Unsteadiness on feet: Secondary | ICD-10-CM | POA: Diagnosis not present

## 2023-09-26 DIAGNOSIS — R2689 Other abnormalities of gait and mobility: Secondary | ICD-10-CM | POA: Diagnosis not present

## 2023-09-26 DIAGNOSIS — M62511 Muscle wasting and atrophy, not elsewhere classified, right shoulder: Secondary | ICD-10-CM | POA: Diagnosis not present

## 2023-09-26 DIAGNOSIS — R296 Repeated falls: Secondary | ICD-10-CM | POA: Diagnosis not present

## 2023-09-26 DIAGNOSIS — R278 Other lack of coordination: Secondary | ICD-10-CM | POA: Diagnosis not present

## 2023-09-26 NOTE — Telephone Encounter (Signed)
 Copied from CRM [id number]. Topic: Clinical - Home Health Verbal Orders >> Sep 26, 2023 11:41 AM Leila C wrote: Caller/Agency: Earnie from [employer] at [facility] (independent retirement) [phone] needs verbal order approval for pt to have Oxygen  trial, preferrable a portable oxygen  machine, they noticed patient's oxygen  goes down to 70 when doing functional activities and occupational therapy. Please call back, they need to know as soon as possible.

## 2023-09-27 NOTE — Telephone Encounter (Signed)
 Dr. Kara,  I got message from [facility]. Pt has copd and he is attemptin to do OT. I have not seen him post orthopedic surgery. You last saw him in September. Not sure how to approach this. Not sure if they are asking for portable oxygen  concentrator. 02% 70 very low. Could he follow up with you and then get on oxygen  or get concentrator if indicated.  Thanks, Dallas Maxwell, PA-C     Copied from [employer] [id number]. Topic: Clinical - Home Health Verbal Orders >> Sep 26, 2023 11:41 AM Leila C wrote: Caller/Agency: Earnie from [employer] at [facility] (independent retirement) [phone] needs verbal order approval for pt to have Oxygen  trial, preferrable a portable oxygen  machine, they noticed patient's oxygen  goes down to 70 when doing functional activities and occupational therapy. Please call back, they need to know as soon as possible.

## 2023-09-27 NOTE — Progress Notes (Signed)
 Ophthalmology Clinic note    CHIEF COMPLAINT 1 year follow up with OCT scan. Vision is stable  HISTORY OF PRESENT ILLNESSES Martin Santiago is a 73 y.o. male who presents to the clinic today for follow up. The patient is s/p PPV OD w/ laser and oil on 02/12/21 MG and S/p PPV/SOR MG/RD 08/04/2021. Patient has a history of RD OS s/p surgery by Dr. [name] x 2, PPV with oil by Dr. JAYSON Santiago 2008 (oil remains), aphakia OS, LP vision OS  Today the patient reports vision is stable. Patient denies ocular pain, flashes, floaters and other concerns at this time.    INTERVAL OF HEALTH No recent hospitalizations or surgeries. Health is stable   MEDICATIONS Outpatient Encounter Medications as of 09/28/2023  Medication Sig Dispense Refill  . timolol (TIMOPTIC) 0.5 % ophthalmic solution Administer 1 drop into the right eye Once Daily. 5 mL 1  . albuterol  2.5 mg /3 mL (0.083 %) nebulizer solution 2.5 mg.    . amLODIPine  (NORVASC ) 10 mg tablet Take 10 mg by mouth Once Daily.    . atorvastatin  (LIPITOR) 10 mg tablet Take 10 mg by mouth daily. (Patient not taking: Reported on 04/26/2023)    . Breztri  Aerosphere 160-9-4.8 mcg/actuation HFAA INHALE 2 PUFFS INTO THE LUNGS IN THE MORNING AND AT BEDTIME    . losartan  (COZAAR ) 100 mg tablet Take 100 mg by mouth Once Daily.    . meloxicam (MOBIC) 15 mg tablet Take 15 mg by mouth Once Daily.    . omeprazole  (PriLOSEC) 20 mg DR capsule Take 20 mg by mouth Once Daily.    . sildenafiL  (REVATIO ) 20 mg tablet 3-5 tab prior to sex.(Max use once in 24 hour period)     No facility-administered encounter medications on file as of 09/28/2023.    ALLERGIES Allergies  Allergen Reactions  . Codeine Other (See Comments), Dizziness and Flushing    fainting    PAST MEDICAL HISTORY Past Medical History:  Diagnosis Date  . Acute respiratory failure with hypoxia (CMD) 06/14/2017  . Cataract   . Chronic obstructive pulmonary disease (CMD) 06/13/2017  . Crohn's disease  (CMD)   . GERD (gastroesophageal reflux disease)   . Hypertension   . Pseudophakia of right eye 10/29/2020  . Retinal detachment   . Retinal detachment, right 02/12/2021   Added automatically from request for surgery [id number]  . Secondary open-angle glaucoma of left eye, severe stage 01/07/2016  . Snores    no witnessed apnea. no history of a sleep study  . Tobacco abuse 06/14/2017   Past Surgical History:  Procedure Laterality Date  . APPENDECTOMY  1995   Procedure: APPENDECTOMY; removed with colon surgery  . CATARACT EXTRACTION Right    Procedure: CATARACT EXTRACTION  . INGUINAL HERNIA REPAIR Right    Procedure: INGUINAL HERNIA REPAIR; 1980s  . PARS PLANA VITRECTOMY Right 02/12/2021   Procedure: PARS PLANA VITRECTOMY 23G W/ LASER & OIL;  Surgeon: Martin Rosina Pugh, MD;  Location: [facility] OUTPATIENT OR;  Service: Ophthalmology;  Laterality: Right;  . PARS PLANA VITRECTOMY Right 08/04/2021   Procedure: PARS PLANA VITRECTOMY 23G W/ OIL REMOVAL;  Surgeon: Martin Rosina Pugh, MD;  Location[facility] OUTPAT[facility];  Service: Ophthalmology;  Laterality: Right;  . RETINAL DETACHMENT SURGERY Left    Procedure: RETINAL DETACHMENT SURGERY; x3  . RIGHT COLECTOMY  1995   Procedure: RIGHT COLECTOMY  . TONSILLECTOMY  1956   Procedure: TONSILLECTOMY    SOCIAL HISTORY Social History   Tobacco Use  .  Smoking status: Every Day    Current packs/day: 0.60    Types: Cigarettes  . Smokeless tobacco: Never  Substance Use Topics  . Alcohol use: Yes    Comment: occasional  . Drug use: No    FAMILY HISTORY Family History  Problem Relation Name Age of Onset  . Cancer Mother    . Cancer Maternal Aunt    . Cancer Paternal Aunt    . Stroke Paternal Grandmother      OPHTHALMIC REVIEW OF SYSTEMS Negative unless otherwise specified.  Redness,  Irritation, pain, discharge, dryness, itching Flashes, floaters, visual field defect Loss of vision, blurred vision , Double vision, pain on eye  movement, misaligned eyes Glare, Lid abnormalities      OPHTHALMIC EXAM    Base Eye Exam     Visual Acuity (Snellen - Linear)       Right Left   Dist [name] 20/40 -1 [name]   Dist ph  NI          Tonometry (Tonopen: Tetracaine OU, 3:33 PM)       Right Left   Pressure 12 30         Tonometry #2 (Tonopen: Tetracaine OU, 3:34 PM)       Right Left   Pressure  27         Pupils       Shape React APD   Right Round NR None   Left Irregular NR None         Visual Fields (Counting fingers)       Left Right   Restrictions Total superior temporal, inferior temporal, superior nasal, inferior nasal deficiencies Partial outer inferior temporal deficiency         Extraocular Movement       Right Left    Full Abnormal    -- -- --  --  --  -- -- --   -- -- --  --  --  -- -- --           Neuro/Psych     Oriented x3: Yes   Mood/Affect: Normal         Dilation     Both eyes: 1.0% Tropicamide, 2.5% Phenylephrine  @ 3:34 PM           Slit Lamp and Fundus Exam     Slit Lamp Exam       Right Left   Lids/Lashes Normal Normal   Conjunctiva/Sclera white and quiet White and quiet   Cornea clear Clear   Anterior Chamber Deep and quiet Deep and quiet   Iris dilated oblong, 7 mm   Lens toric PCIOL, 1 + PCO,non central aphakia   Anterior Vitreous clear 70% oil fill         Fundus Exam       Right Left   Disc PPA, cupping, inferior and temporal rim thinning 4+ Pallor   C/D Ratio 0.65    Macula flat flat   Vessels attenuated    Periphery Large posterior HST superonasally, ST HST flat with good laser, inferior HST flat good laser surrounding tears, 360 laser scars, attached inferior srf not to arcade, buckle in periphery, laser 360              ASSESSMENT   1. Retinal detachment, right  OCT, Macula - OU - Both Eyes    2. Pseudophakia of right eye      3. Aphakia, left      4. Glaucoma suspect of  right eye      5. Retinal detachment  of left eye with multiple breaks - Left Eye oil 2008          Retinal detachment right eye.  The incidence, risk factors, and natural history of retinal detachment was discussed with patient.  Potential treatment options including delimiting laser, pneumatic retinopexy, scleral buckle, and vitrectomy, cryotherapy and laser, and the use of air, gas, and oil discussed with patient.  The risks of blindness, loss of vision, infection, hemorrhage, cataract progression or lens displacement were discussed with patient. s/p PPV OD w/ laser and oil on 02/12/21 MG and S/p PPV/SOR MG/RD 08/04/2021. Retina attached and macula with good morphology  Glaucoma suspect right eye. Followed by Dr. McCuen . Continue Timolol QAM OD.  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   PLAN   Obtained OCT today Continue Timolol QAM OD Return in about 1 year (around 09/27/2024) for OCT.   Patient Instructions  Continue to see Dr Leslee  Continue Timoptic 1 x day in the right eye  Return in one year    Lorrene HERO. Cheree MD, FACS Charlie JUDITHANN Ferrier Professor and [facility] Department of Ophthalmology [facility]  Abbreviations DM diabetes mellitus; DME diabetic macular edema; PDR proliferative diabetic retinopathy; NPDR non proliferative diabetic retinopathy; Ex AMD exudative age related macular degeneration; Dry AMD  Dry age related macular degeneration; BRVO  Branch retinal vein occlusion; CRVO central retinal vein occlusion; ME macular edema; RD retinal detachment; RT retinal tear; SB scleral buckle; PPV pars plana vitrecctomy; OAG Open angle glaucoma; CE  IOL cataract extraction with intraocular lens;  PC IOL  posterior chamber intraocular lens; VH  Vitreous hemorrhage; PRP  panretinal laser photocoagulation;  AC IOL anterior chamber intraocular lens IVK intravitreal kenalog ; IVA intravitreal avastin; IVL intravitreal Lucentis; IVE  Intravitreal Eylea; MFC multifocal choroiditis; PVD posterior vitreous detachment; OD right eye; OS left eye; OU  Both eyes;  VMT  vitreomacular traction;  MI  Myocardial infarction;   COPD  Chronic obstructive pulmonary disease, CRF  Chronic renal failure; HTN  Hypertension; MH  Macular hole; ERM epiretinal membrane; NVD neovascularization of the disc,; NVE neovascularization elsewhere; AREDS age related eye disease study   This document serves as a record of services personally performed by Lorrene HERO. Greven, MD. It was created on their behalf by Charmaine A. Georgina, a trained medical scribe. The creation of this record is the provider's dictation and/or activities during the visit. I agree the documentation is accurate and complete.  Electronically signed by: Lorrene Cheree MD 09/28/2023 4:05 PM

## 2023-09-28 DIAGNOSIS — R2681 Unsteadiness on feet: Secondary | ICD-10-CM | POA: Diagnosis not present

## 2023-09-28 DIAGNOSIS — R278 Other lack of coordination: Secondary | ICD-10-CM | POA: Diagnosis not present

## 2023-09-28 DIAGNOSIS — R2689 Other abnormalities of gait and mobility: Secondary | ICD-10-CM | POA: Diagnosis not present

## 2023-09-28 DIAGNOSIS — H40001 Preglaucoma, unspecified, right eye: Secondary | ICD-10-CM | POA: Diagnosis not present

## 2023-09-28 DIAGNOSIS — M62512 Muscle wasting and atrophy, not elsewhere classified, left shoulder: Secondary | ICD-10-CM | POA: Diagnosis not present

## 2023-09-28 DIAGNOSIS — M158 Other polyosteoarthritis: Secondary | ICD-10-CM | POA: Diagnosis not present

## 2023-09-28 DIAGNOSIS — M62511 Muscle wasting and atrophy, not elsewhere classified, right shoulder: Secondary | ICD-10-CM | POA: Diagnosis not present

## 2023-09-28 DIAGNOSIS — H2702 Aphakia, left eye: Secondary | ICD-10-CM | POA: Diagnosis not present

## 2023-09-28 DIAGNOSIS — H33022 Retinal detachment with multiple breaks, left eye: Secondary | ICD-10-CM | POA: Diagnosis not present

## 2023-09-28 DIAGNOSIS — R296 Repeated falls: Secondary | ICD-10-CM | POA: Diagnosis not present

## 2023-09-28 NOTE — Telephone Encounter (Signed)
 Please schedule patient for evaluation for need of supplemental oxygen in one of my blocked slots in my upcoming clinic days.  Thanks, JD

## 2023-09-29 NOTE — Telephone Encounter (Signed)
 Please leave message in [facility] desk pool until pt returns call

## 2023-09-29 NOTE — Telephone Encounter (Signed)
 LM for PT we have a spot 1/23 @ 4:00. Asked him to call and ask for me.

## 2023-10-02 DIAGNOSIS — R296 Repeated falls: Secondary | ICD-10-CM | POA: Diagnosis not present

## 2023-10-02 DIAGNOSIS — M158 Other polyosteoarthritis: Secondary | ICD-10-CM | POA: Diagnosis not present

## 2023-10-02 DIAGNOSIS — M62511 Muscle wasting and atrophy, not elsewhere classified, right shoulder: Secondary | ICD-10-CM | POA: Diagnosis not present

## 2023-10-02 DIAGNOSIS — R2681 Unsteadiness on feet: Secondary | ICD-10-CM | POA: Diagnosis not present

## 2023-10-02 DIAGNOSIS — R278 Other lack of coordination: Secondary | ICD-10-CM | POA: Diagnosis not present

## 2023-10-02 DIAGNOSIS — R2689 Other abnormalities of gait and mobility: Secondary | ICD-10-CM | POA: Diagnosis not present

## 2023-10-03 ENCOUNTER — Telehealth: Payer: Self-pay

## 2023-10-03 NOTE — Telephone Encounter (Signed)
 Copied from CRM [id number]. Topic: Clinical - Home Health Verbal Orders >> Oct 03, 2023 11:38 AM Russell PARAS wrote: Caller/Agency: [facility] [phone] Service Requested: Calling to confirm they did receive palliative care order, and will follow the order.  Frequency: N/a Any new concerns about the patient? No

## 2023-10-04 DIAGNOSIS — R2689 Other abnormalities of gait and mobility: Secondary | ICD-10-CM | POA: Diagnosis not present

## 2023-10-04 DIAGNOSIS — R296 Repeated falls: Secondary | ICD-10-CM | POA: Diagnosis not present

## 2023-10-04 DIAGNOSIS — R278 Other lack of coordination: Secondary | ICD-10-CM | POA: Diagnosis not present

## 2023-10-04 DIAGNOSIS — R2681 Unsteadiness on feet: Secondary | ICD-10-CM | POA: Diagnosis not present

## 2023-10-04 DIAGNOSIS — M158 Other polyosteoarthritis: Secondary | ICD-10-CM | POA: Diagnosis not present

## 2023-10-04 DIAGNOSIS — M62511 Muscle wasting and atrophy, not elsewhere classified, right shoulder: Secondary | ICD-10-CM | POA: Diagnosis not present

## 2023-10-05 NOTE — Telephone Encounter (Signed)
 Pt is at assisted living in [place]  Pt has a pulmonology appt on 10/19/23  Also notes have been placed in red folder

## 2023-10-09 DIAGNOSIS — J432 Centrilobular emphysema: Secondary | ICD-10-CM | POA: Diagnosis not present

## 2023-10-09 DIAGNOSIS — N401 Enlarged prostate with lower urinary tract symptoms: Secondary | ICD-10-CM | POA: Diagnosis not present

## 2023-10-09 DIAGNOSIS — Z515 Encounter for palliative care: Secondary | ICD-10-CM | POA: Diagnosis not present

## 2023-10-09 DIAGNOSIS — J9611 Chronic respiratory failure with hypoxia: Secondary | ICD-10-CM | POA: Diagnosis not present

## 2023-10-10 ENCOUNTER — Encounter: Payer: Self-pay | Admitting: Family Medicine

## 2023-10-10 DIAGNOSIS — M158 Other polyosteoarthritis: Secondary | ICD-10-CM | POA: Diagnosis not present

## 2023-10-10 DIAGNOSIS — M62511 Muscle wasting and atrophy, not elsewhere classified, right shoulder: Secondary | ICD-10-CM | POA: Diagnosis not present

## 2023-10-10 DIAGNOSIS — R2689 Other abnormalities of gait and mobility: Secondary | ICD-10-CM | POA: Diagnosis not present

## 2023-10-10 DIAGNOSIS — R296 Repeated falls: Secondary | ICD-10-CM | POA: Diagnosis not present

## 2023-10-10 DIAGNOSIS — R2681 Unsteadiness on feet: Secondary | ICD-10-CM | POA: Diagnosis not present

## 2023-10-10 DIAGNOSIS — R278 Other lack of coordination: Secondary | ICD-10-CM | POA: Diagnosis not present

## 2023-10-11 DIAGNOSIS — R2689 Other abnormalities of gait and mobility: Secondary | ICD-10-CM | POA: Diagnosis not present

## 2023-10-11 DIAGNOSIS — R2681 Unsteadiness on feet: Secondary | ICD-10-CM | POA: Diagnosis not present

## 2023-10-11 DIAGNOSIS — R278 Other lack of coordination: Secondary | ICD-10-CM | POA: Diagnosis not present

## 2023-10-11 DIAGNOSIS — M62511 Muscle wasting and atrophy, not elsewhere classified, right shoulder: Secondary | ICD-10-CM | POA: Diagnosis not present

## 2023-10-11 DIAGNOSIS — R296 Repeated falls: Secondary | ICD-10-CM | POA: Diagnosis not present

## 2023-10-11 DIAGNOSIS — M158 Other polyosteoarthritis: Secondary | ICD-10-CM | POA: Diagnosis not present

## 2023-10-12 DIAGNOSIS — R296 Repeated falls: Secondary | ICD-10-CM | POA: Diagnosis not present

## 2023-10-12 DIAGNOSIS — M158 Other polyosteoarthritis: Secondary | ICD-10-CM | POA: Diagnosis not present

## 2023-10-12 DIAGNOSIS — M62511 Muscle wasting and atrophy, not elsewhere classified, right shoulder: Secondary | ICD-10-CM | POA: Diagnosis not present

## 2023-10-12 DIAGNOSIS — R278 Other lack of coordination: Secondary | ICD-10-CM | POA: Diagnosis not present

## 2023-10-12 DIAGNOSIS — R2681 Unsteadiness on feet: Secondary | ICD-10-CM | POA: Diagnosis not present

## 2023-10-12 DIAGNOSIS — R2689 Other abnormalities of gait and mobility: Secondary | ICD-10-CM | POA: Diagnosis not present

## 2023-10-16 DIAGNOSIS — R2689 Other abnormalities of gait and mobility: Secondary | ICD-10-CM | POA: Diagnosis not present

## 2023-10-16 DIAGNOSIS — R296 Repeated falls: Secondary | ICD-10-CM | POA: Diagnosis not present

## 2023-10-16 DIAGNOSIS — M62511 Muscle wasting and atrophy, not elsewhere classified, right shoulder: Secondary | ICD-10-CM | POA: Diagnosis not present

## 2023-10-16 DIAGNOSIS — R278 Other lack of coordination: Secondary | ICD-10-CM | POA: Diagnosis not present

## 2023-10-16 DIAGNOSIS — M158 Other polyosteoarthritis: Secondary | ICD-10-CM | POA: Diagnosis not present

## 2023-10-16 DIAGNOSIS — R2681 Unsteadiness on feet: Secondary | ICD-10-CM | POA: Diagnosis not present

## 2023-10-17 DIAGNOSIS — M158 Other polyosteoarthritis: Secondary | ICD-10-CM | POA: Diagnosis not present

## 2023-10-17 DIAGNOSIS — M62511 Muscle wasting and atrophy, not elsewhere classified, right shoulder: Secondary | ICD-10-CM | POA: Diagnosis not present

## 2023-10-17 DIAGNOSIS — R278 Other lack of coordination: Secondary | ICD-10-CM | POA: Diagnosis not present

## 2023-10-17 DIAGNOSIS — R296 Repeated falls: Secondary | ICD-10-CM | POA: Diagnosis not present

## 2023-10-17 DIAGNOSIS — R2689 Other abnormalities of gait and mobility: Secondary | ICD-10-CM | POA: Diagnosis not present

## 2023-10-17 DIAGNOSIS — R2681 Unsteadiness on feet: Secondary | ICD-10-CM | POA: Diagnosis not present

## 2023-10-18 DIAGNOSIS — M62511 Muscle wasting and atrophy, not elsewhere classified, right shoulder: Secondary | ICD-10-CM | POA: Diagnosis not present

## 2023-10-18 DIAGNOSIS — R2689 Other abnormalities of gait and mobility: Secondary | ICD-10-CM | POA: Diagnosis not present

## 2023-10-18 DIAGNOSIS — M158 Other polyosteoarthritis: Secondary | ICD-10-CM | POA: Diagnosis not present

## 2023-10-18 DIAGNOSIS — R2681 Unsteadiness on feet: Secondary | ICD-10-CM | POA: Diagnosis not present

## 2023-10-18 DIAGNOSIS — R296 Repeated falls: Secondary | ICD-10-CM | POA: Diagnosis not present

## 2023-10-18 DIAGNOSIS — R278 Other lack of coordination: Secondary | ICD-10-CM | POA: Diagnosis not present

## 2023-10-19 ENCOUNTER — Ambulatory Visit (INDEPENDENT_AMBULATORY_CARE_PROVIDER_SITE_OTHER): Payer: Medicare Other | Admitting: Pulmonary Disease

## 2023-10-19 ENCOUNTER — Encounter: Payer: Self-pay | Admitting: Pulmonary Disease

## 2023-10-19 VITALS — BP 130/82 | HR 88 | Temp 97.2°F | Ht 66.0 in | Wt 132.5 lb

## 2023-10-19 DIAGNOSIS — J432 Centrilobular emphysema: Secondary | ICD-10-CM

## 2023-10-19 DIAGNOSIS — F17210 Nicotine dependence, cigarettes, uncomplicated: Secondary | ICD-10-CM

## 2023-10-19 MED ORDER — PREDNISONE 10 MG PO TABS
ORAL_TABLET | ORAL | 0 refills | Status: AC
Start: 2023-10-19 — End: 2023-10-31

## 2023-10-19 MED ORDER — AZITHROMYCIN 250 MG PO TABS
ORAL_TABLET | ORAL | 0 refills | Status: AC
Start: 2023-10-19 — End: ?

## 2023-10-19 NOTE — Patient Instructions (Addendum)
Continue breztri inhaler 2 puffs twice daily - rinse mouth out after each use  Use albuterol inhaler or nebs as needed every 4-6 hours as needed  We will check you for needing oxygen while you walk today  Follow up in 6 months, call sooner if needed

## 2023-10-19 NOTE — Progress Notes (Signed)
Synopsis: Referred in September 2022 for COPD  Subjective:   PATIENT ID: Martin Santiago GENDER: male DOB: 1951/04/19, MRN: [id number]  HPI  Chief Complaint  Patient presents with   Follow-up   Martin Santiago is a 73 year old male, daily smoker with hypertension and emphysema who returns to pulmonary clinic for COPD follow up.   He presents for an assessment of his need for portable oxygen. He reports a decrease in his overall ability to perform daily activities, including walking from his sofa to the dining room, a distance of approximately 250 feet, without needing to stop to catch his breath. He has been participating in physical and occupational therapy for several hours a week, which prompted the question of whether portable oxygen would be beneficial.  The patient also reports a persistent dryness in his chest, despite using a nebulizer. He describes difficulty in clearing his chest congestion, noting that the mucus is thicker than usual. He recalls that on two previous occasions, he was prescribed an antibiotic and prednisone, which seemed to help with similar symptoms.  In addition to his pulmonary concerns, the patient mentions a recent change in his living situation, moving to an independent retirement living community.   OV 06/20/23 They report that their primary care doctor prescribed prednisone, which they have not yet started. They describe their breathing as congested and have noticed an increase in the use of their albuterol inhaler.   The patient is currently using a Breztri inhaler twice a day and an albuterol inhaler as needed.   He is smoking 5-6 cigs daily. He received his motorized scooter after last visit.  OV 10/24/22 He continues to have dyspnea and decreased stamina. He has increased congestion over the past few days. He continues to smoke 4-6 cigarettes per day.   OV 04/19/22 PFTs today show severe obstruction, air trapping and severe diffusion defect.  He is  now riding a motorized scooter around the grocery store due to dyspnea.   His wife passed away in 11-30-23, he continues to grieve her loss. He is considering some travel but is concerned about traveling with a nebulizer machine.  OV 09/24/21 Overnight oximitry showed he spent 7 hours and 17 minutes with an SpO2 less than 88%. Patient was offered supplemental oxygen but declined at that time. He was started on breztri inhaler at last visit with good improvement in his shortness of breath. He is also using albuterol nebulizer twice daily with relief. He reports he is sleeping better since starting inhaler therapy.   OV 06/25/21 He has had increasing shortness of breath, cough and wheezing over recent months.  He has been on Bevespi inhaler over the last few years with good effect but he has been having more frequent breakthrough shortness of breath and wheezing recently.  He has an as needed albuterol inhaler which he uses but does not find much relief with this inhaler.  He does not have a nebulizer machine at home.  His cough is productive with sputum production on a daily basis.  He continues to smoke daily and has smoked over the past 45 years.  He has a 60+ pack year smoking history.  High-resolution CT scan from 2017 shows extensive centrilobular emphysematous changes.  He has been seeing an ophthalmologist for retinal detachment but does not recall being treated for glaucoma.  Under his problem list there is listed secondary open-angle glaucoma of the left eye.  Past Medical History:  Diagnosis Date   Colon polyps  COPD (chronic obstructive pulmonary disease) (HCC)    Crohn's disease (HCC)    Detached retina    Essential hypertension    Tropical sprue      Family History  Problem Relation Age of Onset   Colon cancer Maternal Aunt    Breast cancer Mother      Social History   Socioeconomic History   Marital status: Widowed    Spouse name: Not on file   Number of children: 0    Years of education: Not on file   Highest education level: Bachelor's degree (e.g., BA, AB, BS)  Occupational History   Occupation: retired, Counsellor, Engineer, agricultural: SELF EMPLOYED  Tobacco Use   Smoking status: Every Day    Current packs/day: 2.00    Average packs/day: 2.0 packs/day for 55.0 years (110.0 ttl pk-yrs)    Types: Cigarettes   Smokeless tobacco: Never   Tobacco comments:    Smoke 1/4 pack a day. Tay 04/19/22  Substance and Sexual Activity   Alcohol use: Yes    Alcohol/week: 0.0 standard drinks of alcohol    Comment: Rare, 3-4 per yr   Drug use: No    Comment: Remote marijuana   Sexual activity: Not Currently  Other Topics Concern   Not on file  Social History Narrative   Wife, Darl Pikes passed away in 06-Dec-2021 from [place].   1 step-daughter who lives in [place].   Social Drivers of Corporate investment banker Strain: Low Risk  (04/06/2023)   Overall Financial Resource Strain (CARDIA)    Difficulty of Paying Living Expenses: Not hard at all  Food Insecurity: No Food Insecurity (04/06/2023)   Hunger Vital Sign    Worried About Running Out of Food in the Last Year: Never true    Ran Out of Food in the Last Year: Never true  Transportation Needs: No Transportation Needs (04/06/2023)   PRAPARE - Administrator, Civil Service (Medical): No    Lack of Transportation (Non-Medical): No  Physical Activity: Unknown (04/06/2023)   Exercise Vital Sign    Days of Exercise per Week: 0 days    Minutes of Exercise per Session: Not on file  Stress: No Stress Concern Present (04/06/2023)   [employer] of Occupational Health - Occupational Stress Questionnaire    Feeling of Stress : Only a little  Social Connections: Moderately Integrated (04/06/2023)   Social Connection and Isolation Panel [NHANES]    Frequency of Communication with Friends and Family: More than three times a week    Frequency of Social Gatherings with Friends and Family: More  than three times a week    Attends Religious Services: More than 4 times per year    Active Member of [employer] or Organizations: Yes    Attends Banker Meetings: More than 4 times per year    Marital Status: Widowed  Intimate Partner Violence: Not At Risk (01/16/2023)   Humiliation, Afraid, Rape, and Kick questionnaire    Fear of Current or Ex-Partner: No    Emotionally Abused: No    Physically Abused: No    Sexually Abused: No     Allergies  Allergen Reactions   Codeine Nausea And Vomiting and Other (See Comments)    Other reaction(s): Flushing (ALLERGY/intolerance) fainting     Outpatient Medications Prior to Visit  Medication Sig Dispense Refill   albuterol (PROVENTIL) (2.5 MG/3ML) 0.083% nebulizer solution Take 3 mLs (2.5 mg total) by nebulization every 4 (  four) hours. 540 mL 11   albuterol (VENTOLIN HFA) 108 (90 Base) MCG/ACT inhaler Inhale 2 puffs into the lungs every 6 (six) hours as needed for wheezing or shortness of breath. 18 g 11   amLODipine (NORVASC) 10 MG tablet Take 1 tablet (10 mg total) by mouth daily. Reported on 02/25/2016 90 tablet 3   aspirin EC 81 MG tablet Take 1 tablet (81 mg total) by mouth daily. Swallow whole.     atorvastatin (LIPITOR) 20 MG tablet Take 1 tablet (20 mg total) by mouth daily. 90 tablet 3   BREZTRI AEROSPHERE 160-9-4.8 MCG/ACT AERO INHALE 2 PUFFS INTO THE LUNGS IN THE MORNING AND AT BEDTIME 10.7 g 5   losartan (COZAAR) 100 MG tablet Take 1 tablet (100 mg total) by mouth daily. Reported on 02/25/2016 90 tablet 3   omeprazole (PRILOSEC) 20 MG capsule TAKE 1 CAPSULE(20 MG) BY MOUTH DAILY (Patient taking differently: Take 20 mg by mouth daily.) 90 capsule 2   sildenafil (REVATIO) 20 MG tablet 3-5 tab prior to sex.(Max use once in 24 hour period) (Patient taking differently: Take 20 mg by mouth See admin instructions. 3-5 tab prior to sex.(Max use once in 24 hour period)) 50 tablet 0   timolol (TIMOPTIC) 0.5 % ophthalmic solution Place 1 drop  into both eyes daily.     COVID-19 mRNA bivalent vaccine, Moderna, (MODERNA COVID-19 BIVAL BOOSTER) 50 MCG/0.5ML injection Inject into the muscle. (Patient taking differently: Inject 0.5 mLs into the muscle once.) 0.5 mL 0   No facility-administered medications prior to visit.    Review of Systems  Constitutional:  Negative for chills, fever, malaise/fatigue and weight loss.  HENT:  Negative for congestion, sinus pain and sore throat.   Eyes: Negative.   Respiratory:  Positive for cough, sputum production, shortness of breath and wheezing. Negative for hemoptysis.   Cardiovascular:  Negative for chest pain, palpitations, orthopnea, claudication and leg swelling.  Gastrointestinal:  Negative for abdominal pain, heartburn, nausea and vomiting.  Genitourinary: Negative.   Musculoskeletal:  Negative for joint pain and myalgias.  Skin:  Negative for rash.  Neurological:  Negative for weakness.  Endo/Heme/Allergies: Negative.   Psychiatric/Behavioral: Negative.     Objective:   Vitals:   10/19/23 1602 10/19/23 1603  BP: 130/82 130/82  Pulse:  88  Temp:  (!) 97.2 F (36.2 C)  TempSrc:  Temporal  SpO2: 94% 94%  Weight:  132 lb 8 oz (60.1 kg)  Height:  5\' 6"  (1.676 m)    Physical Exam Constitutional:      General: He is not in acute distress. HENT:     Head: Normocephalic and atraumatic.  Eyes:     Conjunctiva/sclera: Conjunctivae normal.  Cardiovascular:     Rate and Rhythm: Normal rate and regular rhythm.     Pulses: Normal pulses.     Heart sounds: Normal heart sounds. No murmur heard. Pulmonary:     Effort: Pulmonary effort is normal.     Breath sounds: Decreased air movement present. Wheezing (mild scattered) present. No rhonchi or rales.  Musculoskeletal:     Right lower leg: No edema.     Left lower leg: No edema.  Skin:    General: Skin is warm and dry.  Neurological:     General: No focal deficit present.     Mental Status: He is alert.    CBC    Component  Value Date/Time   WBC 5.8 04/06/2023 1129   RBC 5.09 04/06/2023 1129  HGB 14.0 04/06/2023 1129   HCT 43.4 04/06/2023 1129   PLT 255.0 04/06/2023 1129   MCV 85.3 04/06/2023 1129   MCH 31.1 06/14/2017 0705   MCHC 32.4 04/06/2023 1129   RDW 16.2 (H) 04/06/2023 1129   LYMPHSABS 0.8 04/06/2023 1129   MONOABS 0.7 04/06/2023 1129   EOSABS 0.2 04/06/2023 1129   BASOSABS 0.1 04/06/2023 1129   Chest imaging: HRCT Chest 2017 1. Moderate to severe centrilobular emphysema. No evidence of interstitial lung disease. 2. Basilar subpleural nodules are likely subpleural lymph nodes.  PFT:    Latest Ref Rng & Units 04/19/2022   11:51 AM  PFT Results  FVC-Pre L 2.58   FVC-Predicted Pre % 69   FVC-Post L 2.74   FVC-Predicted Post % 74   Pre FEV1/FVC % % 39   Post FEV1/FCV % % 39   FEV1-Pre L 1.01   FEV1-Predicted Pre % 37   FEV1-Post L 1.07   DLCO uncorrected ml/min/mmHg 7.75   DLCO UNC% % 34   DLCO corrected ml/min/mmHg 7.75   DLCO COR %Predicted % 34   DLVA Predicted % 37   TLC L 7.91   TLC % Predicted % 127   RV % Predicted % 196     Assessment & Plan:   Centrilobular emphysema (HCC) - Plan: predniSONE (DELTASONE) 10 MG tablet, azithromycin (ZITHROMAX) 250 MG tablet, Pulse oximetry, overnight, Ambulatory Referral for DME  Cigarette smoker  Discussion: Martin Santiago is a 73 year old male, daily smoker with hypertension and emphysema who returns to pulmonary clinic for COPD follow up.   Chronic Obstructive Pulmonary Disease (COPD) Increased wheezing and chest congestion concerning for exacerbation -Start prednisone taper and Zpak - Continue breztri inhaler 2 puffs twice daily -Continue albuterol as needed  Chronic Hypoxemic Respiratory failure Patient SpO2 84% after 1 lap on room air. POC titrated to 4L with SpO2 94% after 1 lap of walking. - Send order for supplemental oxygen to DME for POC, pulsed at 4L  Smoking Cessation Patient continues to smoke 5-6 cigarettes per  day. Discussed the impact of continued smoking on COPD progression and overall health. -Encouraged to continue efforts to quit smoking.  Follow up in 6 months.   Melody Comas, MD [facility] Office: [phone]   Current Outpatient Medications:    albuterol (PROVENTIL) (2.5 MG/3ML) 0.083% nebulizer solution, Take 3 mLs (2.5 mg total) by nebulization every 4 (four) hours., Disp: 540 mL, Rfl: 11   albuterol (VENTOLIN HFA) 108 (90 Base) MCG/ACT inhaler, Inhale 2 puffs into the lungs every 6 (six) hours as needed for wheezing or shortness of breath., Disp: 18 g, Rfl: 11   amLODipine (NORVASC) 10 MG tablet, Take 1 tablet (10 mg total) by mouth daily. Reported on 02/25/2016, Disp: 90 tablet, Rfl: 3   aspirin EC 81 MG tablet, Take 1 tablet (81 mg total) by mouth daily. Swallow whole., Disp: , Rfl:    atorvastatin (LIPITOR) 20 MG tablet, Take 1 tablet (20 mg total) by mouth daily., Disp: 90 tablet, Rfl: 3   azithromycin (ZITHROMAX) 250 MG tablet, Take as directed, Disp: 6 tablet, Rfl: 0   BREZTRI AEROSPHERE 160-9-4.8 MCG/ACT AERO, INHALE 2 PUFFS INTO THE LUNGS IN THE MORNING AND AT BEDTIME, Disp: 10.7 g, Rfl: 5   losartan (COZAAR) 100 MG tablet, Take 1 tablet (100 mg total) by mouth daily. Reported on 02/25/2016, Disp: 90 tablet, Rfl: 3   omeprazole (PRILOSEC) 20 MG capsule, TAKE 1 CAPSULE(20 MG) BY MOUTH DAILY (  Patient taking differently: Take 20 mg by mouth daily.), Disp: 90 capsule, Rfl: 2   predniSONE (DELTASONE) 10 MG tablet, Take 4 tablets (40 mg total) by mouth daily with breakfast for 3 days, THEN 3 tablets (30 mg total) daily with breakfast for 3 days, THEN 2 tablets (20 mg total) daily with breakfast for 3 days, THEN 1 tablet (10 mg total) daily with breakfast for 3 days., Disp: 30 tablet, Rfl: 0   sildenafil (REVATIO) 20 MG tablet, 3-5 tab prior to sex.(Max use once in 24 hour period) (Patient taking differently: Take 20 mg by mouth See admin instructions. 3-5 tab prior  to sex.(Max use once in 24 hour period)), Disp: 50 tablet, Rfl: 0   timolol (TIMOPTIC) 0.5 % ophthalmic solution, Place 1 drop into both eyes daily., Disp: , Rfl:

## 2023-10-20 ENCOUNTER — Encounter: Payer: Self-pay | Admitting: Pulmonary Disease

## 2023-10-21 ENCOUNTER — Encounter: Payer: Self-pay | Admitting: Pulmonary Disease

## 2023-10-23 ENCOUNTER — Telehealth: Payer: Self-pay | Admitting: Pulmonary Disease

## 2023-10-23 DIAGNOSIS — R278 Other lack of coordination: Secondary | ICD-10-CM | POA: Diagnosis not present

## 2023-10-23 DIAGNOSIS — M158 Other polyosteoarthritis: Secondary | ICD-10-CM | POA: Diagnosis not present

## 2023-10-23 DIAGNOSIS — R2681 Unsteadiness on feet: Secondary | ICD-10-CM | POA: Diagnosis not present

## 2023-10-23 DIAGNOSIS — R296 Repeated falls: Secondary | ICD-10-CM | POA: Diagnosis not present

## 2023-10-23 DIAGNOSIS — M62511 Muscle wasting and atrophy, not elsewhere classified, right shoulder: Secondary | ICD-10-CM | POA: Diagnosis not present

## 2023-10-23 DIAGNOSIS — R2689 Other abnormalities of gait and mobility: Secondary | ICD-10-CM | POA: Diagnosis not present

## 2023-10-25 NOTE — Telephone Encounter (Signed)
The walk results have not been put in the [facility] note and message from [place] with [employer] states Martin Santiago  Order needs changed to continuous  and we need sats, sats also need to be co-signed by provider or added to provider progress note. Also RX would need to be signed.

## 2023-10-25 NOTE — Telephone Encounter (Signed)
Order has been sent to [employer]

## 2023-10-26 DIAGNOSIS — R2689 Other abnormalities of gait and mobility: Secondary | ICD-10-CM | POA: Diagnosis not present

## 2023-10-26 DIAGNOSIS — R296 Repeated falls: Secondary | ICD-10-CM | POA: Diagnosis not present

## 2023-10-26 DIAGNOSIS — M62511 Muscle wasting and atrophy, not elsewhere classified, right shoulder: Secondary | ICD-10-CM | POA: Diagnosis not present

## 2023-10-26 DIAGNOSIS — R2681 Unsteadiness on feet: Secondary | ICD-10-CM | POA: Diagnosis not present

## 2023-10-26 DIAGNOSIS — M158 Other polyosteoarthritis: Secondary | ICD-10-CM | POA: Diagnosis not present

## 2023-10-26 DIAGNOSIS — R278 Other lack of coordination: Secondary | ICD-10-CM | POA: Diagnosis not present

## 2023-10-26 NOTE — Telephone Encounter (Signed)
PT calling from [place] saying he still does not know results of walk test and 02 order.I told him order has been sent but he said [employer] has nothing on it,  Please call to advise [phone].  Also he wants it to go to locally not to his address on file

## 2023-10-31 DIAGNOSIS — J441 Chronic obstructive pulmonary disease with (acute) exacerbation: Secondary | ICD-10-CM | POA: Diagnosis not present

## 2023-10-31 DIAGNOSIS — N401 Enlarged prostate with lower urinary tract symptoms: Secondary | ICD-10-CM | POA: Diagnosis not present

## 2023-10-31 DIAGNOSIS — J432 Centrilobular emphysema: Secondary | ICD-10-CM | POA: Diagnosis not present

## 2023-10-31 DIAGNOSIS — Z515 Encounter for palliative care: Secondary | ICD-10-CM | POA: Diagnosis not present

## 2023-10-31 DIAGNOSIS — J9611 Chronic respiratory failure with hypoxia: Secondary | ICD-10-CM | POA: Diagnosis not present

## 2023-11-01 DIAGNOSIS — M158 Other polyosteoarthritis: Secondary | ICD-10-CM | POA: Diagnosis not present

## 2023-11-01 DIAGNOSIS — M62511 Muscle wasting and atrophy, not elsewhere classified, right shoulder: Secondary | ICD-10-CM | POA: Diagnosis not present

## 2023-11-01 DIAGNOSIS — M62512 Muscle wasting and atrophy, not elsewhere classified, left shoulder: Secondary | ICD-10-CM | POA: Diagnosis not present

## 2023-11-01 DIAGNOSIS — R278 Other lack of coordination: Secondary | ICD-10-CM | POA: Diagnosis not present

## 2023-11-01 DIAGNOSIS — R2681 Unsteadiness on feet: Secondary | ICD-10-CM | POA: Diagnosis not present

## 2023-11-01 DIAGNOSIS — R2689 Other abnormalities of gait and mobility: Secondary | ICD-10-CM | POA: Diagnosis not present

## 2023-11-01 DIAGNOSIS — R296 Repeated falls: Secondary | ICD-10-CM | POA: Diagnosis not present

## 2023-11-03 DIAGNOSIS — R2689 Other abnormalities of gait and mobility: Secondary | ICD-10-CM | POA: Diagnosis not present

## 2023-11-03 DIAGNOSIS — R296 Repeated falls: Secondary | ICD-10-CM | POA: Diagnosis not present

## 2023-11-03 DIAGNOSIS — R278 Other lack of coordination: Secondary | ICD-10-CM | POA: Diagnosis not present

## 2023-11-03 DIAGNOSIS — M158 Other polyosteoarthritis: Secondary | ICD-10-CM | POA: Diagnosis not present

## 2023-11-03 DIAGNOSIS — R2681 Unsteadiness on feet: Secondary | ICD-10-CM | POA: Diagnosis not present

## 2023-11-03 DIAGNOSIS — M62511 Muscle wasting and atrophy, not elsewhere classified, right shoulder: Secondary | ICD-10-CM | POA: Diagnosis not present

## 2023-11-07 DIAGNOSIS — G4730 Sleep apnea, unspecified: Secondary | ICD-10-CM | POA: Diagnosis not present

## 2023-11-07 DIAGNOSIS — R0902 Hypoxemia: Secondary | ICD-10-CM | POA: Diagnosis not present

## 2023-11-08 DIAGNOSIS — R278 Other lack of coordination: Secondary | ICD-10-CM | POA: Diagnosis not present

## 2023-11-08 DIAGNOSIS — R2681 Unsteadiness on feet: Secondary | ICD-10-CM | POA: Diagnosis not present

## 2023-11-08 DIAGNOSIS — M62511 Muscle wasting and atrophy, not elsewhere classified, right shoulder: Secondary | ICD-10-CM | POA: Diagnosis not present

## 2023-11-08 DIAGNOSIS — M158 Other polyosteoarthritis: Secondary | ICD-10-CM | POA: Diagnosis not present

## 2023-11-08 DIAGNOSIS — R296 Repeated falls: Secondary | ICD-10-CM | POA: Diagnosis not present

## 2023-11-08 DIAGNOSIS — R2689 Other abnormalities of gait and mobility: Secondary | ICD-10-CM | POA: Diagnosis not present

## 2023-11-09 DIAGNOSIS — M158 Other polyosteoarthritis: Secondary | ICD-10-CM | POA: Diagnosis not present

## 2023-11-09 DIAGNOSIS — R296 Repeated falls: Secondary | ICD-10-CM | POA: Diagnosis not present

## 2023-11-09 DIAGNOSIS — R278 Other lack of coordination: Secondary | ICD-10-CM | POA: Diagnosis not present

## 2023-11-09 DIAGNOSIS — R2681 Unsteadiness on feet: Secondary | ICD-10-CM | POA: Diagnosis not present

## 2023-11-09 DIAGNOSIS — R2689 Other abnormalities of gait and mobility: Secondary | ICD-10-CM | POA: Diagnosis not present

## 2023-11-09 DIAGNOSIS — M62511 Muscle wasting and atrophy, not elsewhere classified, right shoulder: Secondary | ICD-10-CM | POA: Diagnosis not present

## 2023-11-12 DIAGNOSIS — R296 Repeated falls: Secondary | ICD-10-CM | POA: Diagnosis not present

## 2023-11-12 DIAGNOSIS — R2689 Other abnormalities of gait and mobility: Secondary | ICD-10-CM | POA: Diagnosis not present

## 2023-11-12 DIAGNOSIS — R2681 Unsteadiness on feet: Secondary | ICD-10-CM | POA: Diagnosis not present

## 2023-11-12 DIAGNOSIS — M62511 Muscle wasting and atrophy, not elsewhere classified, right shoulder: Secondary | ICD-10-CM | POA: Diagnosis not present

## 2023-11-12 DIAGNOSIS — R278 Other lack of coordination: Secondary | ICD-10-CM | POA: Diagnosis not present

## 2023-11-12 DIAGNOSIS — M158 Other polyosteoarthritis: Secondary | ICD-10-CM | POA: Diagnosis not present

## 2023-11-14 DIAGNOSIS — M158 Other polyosteoarthritis: Secondary | ICD-10-CM | POA: Diagnosis not present

## 2023-11-14 DIAGNOSIS — J432 Centrilobular emphysema: Secondary | ICD-10-CM | POA: Diagnosis not present

## 2023-11-14 DIAGNOSIS — R2681 Unsteadiness on feet: Secondary | ICD-10-CM | POA: Diagnosis not present

## 2023-11-14 DIAGNOSIS — R278 Other lack of coordination: Secondary | ICD-10-CM | POA: Diagnosis not present

## 2023-11-14 DIAGNOSIS — Z515 Encounter for palliative care: Secondary | ICD-10-CM | POA: Diagnosis not present

## 2023-11-14 DIAGNOSIS — R2689 Other abnormalities of gait and mobility: Secondary | ICD-10-CM | POA: Diagnosis not present

## 2023-11-14 DIAGNOSIS — N401 Enlarged prostate with lower urinary tract symptoms: Secondary | ICD-10-CM | POA: Diagnosis not present

## 2023-11-14 DIAGNOSIS — M62511 Muscle wasting and atrophy, not elsewhere classified, right shoulder: Secondary | ICD-10-CM | POA: Diagnosis not present

## 2023-11-14 DIAGNOSIS — J9611 Chronic respiratory failure with hypoxia: Secondary | ICD-10-CM | POA: Diagnosis not present

## 2023-11-14 DIAGNOSIS — J441 Chronic obstructive pulmonary disease with (acute) exacerbation: Secondary | ICD-10-CM | POA: Diagnosis not present

## 2023-11-14 DIAGNOSIS — R296 Repeated falls: Secondary | ICD-10-CM | POA: Diagnosis not present

## 2023-11-16 ENCOUNTER — Telehealth: Payer: Self-pay | Admitting: Pulmonary Disease

## 2023-11-16 DIAGNOSIS — J449 Chronic obstructive pulmonary disease, unspecified: Secondary | ICD-10-CM

## 2023-11-16 NOTE — Telephone Encounter (Signed)
ONO Results  Please let patient know his ONO results.   Patient spent 5 hr 24 min with SpO2 less than 88%. Recommend using 3L of oxygen with sleep.   Thanks, JD

## 2023-11-17 NOTE — Telephone Encounter (Signed)
I have notified the patient. He is asking if you will order a humidifier for his concentrator?

## 2023-11-19 NOTE — Telephone Encounter (Signed)
 Yes, please send an order to his DME company.  Thanks, JD

## 2023-11-20 NOTE — Telephone Encounter (Signed)
 DME order placed for humidifier for oxygen concentrator.  Patient is aware. Nothing further at this time.

## 2023-11-23 ENCOUNTER — Telehealth: Payer: Self-pay | Admitting: Pulmonary Disease

## 2023-11-23 DIAGNOSIS — M62511 Muscle wasting and atrophy, not elsewhere classified, right shoulder: Secondary | ICD-10-CM | POA: Diagnosis not present

## 2023-11-23 DIAGNOSIS — R278 Other lack of coordination: Secondary | ICD-10-CM | POA: Diagnosis not present

## 2023-11-23 DIAGNOSIS — R296 Repeated falls: Secondary | ICD-10-CM | POA: Diagnosis not present

## 2023-11-23 DIAGNOSIS — M158 Other polyosteoarthritis: Secondary | ICD-10-CM | POA: Diagnosis not present

## 2023-11-23 DIAGNOSIS — J441 Chronic obstructive pulmonary disease with (acute) exacerbation: Secondary | ICD-10-CM

## 2023-11-23 DIAGNOSIS — R2689 Other abnormalities of gait and mobility: Secondary | ICD-10-CM | POA: Diagnosis not present

## 2023-11-23 DIAGNOSIS — J449 Chronic obstructive pulmonary disease, unspecified: Secondary | ICD-10-CM

## 2023-11-23 DIAGNOSIS — R2681 Unsteadiness on feet: Secondary | ICD-10-CM | POA: Diagnosis not present

## 2023-11-23 NOTE — Telephone Encounter (Signed)
 Martin Santiago; Kathe Becton; Randie Heinz, Clovis Riley I thought i had responded, I only seen the humidification RX and i didn't find in a progress note where the pt needed it by provider only that the patient requested it.

## 2023-11-23 NOTE — Telephone Encounter (Signed)
 Patient needs a humidifier for his oxygen concentrator.[employer] does not have the order.   Patient would also like an oxygen mask for at night since he is a mouth breather.   He will be traveling and wants to know how he would be able to travel with an oxygen concentrator. He's also not sure if he has sleep apnea .   He was carrying his POC and is wondering if it could be as a backpack for him to carry.    Please call back patient with his inquires.

## 2023-11-23 NOTE — Telephone Encounter (Signed)
 Request made for humidification was made after visit, ok to place order for humidification of oxygen.  Please see telephone note 11/16/23.  JD

## 2023-11-24 ENCOUNTER — Other Ambulatory Visit: Payer: Self-pay | Admitting: Medical

## 2023-11-24 DIAGNOSIS — R296 Repeated falls: Secondary | ICD-10-CM | POA: Diagnosis not present

## 2023-11-24 DIAGNOSIS — R2681 Unsteadiness on feet: Secondary | ICD-10-CM | POA: Diagnosis not present

## 2023-11-24 DIAGNOSIS — R278 Other lack of coordination: Secondary | ICD-10-CM | POA: Diagnosis not present

## 2023-11-24 DIAGNOSIS — R2689 Other abnormalities of gait and mobility: Secondary | ICD-10-CM | POA: Diagnosis not present

## 2023-11-24 DIAGNOSIS — M62511 Muscle wasting and atrophy, not elsewhere classified, right shoulder: Secondary | ICD-10-CM | POA: Diagnosis not present

## 2023-11-24 DIAGNOSIS — M158 Other polyosteoarthritis: Secondary | ICD-10-CM | POA: Diagnosis not present

## 2023-11-24 NOTE — Telephone Encounter (Signed)
 I called and spoke with pt. I informed pt that we could end an order for humidity for his 02. Pt states he also needed an o2 mask at night due to being a mouth breather and wanted a back pack to carry his 02 around. I send those orders to Dr Francine Graven. Pt also stated that he is going to be traveling and wants to know what he needs to do about his oxygen. I informed pt that if his is going to be traveling by plane, then he could not bring tanks, but his poc should be fine. Pt states he could sleep with his poc but I advised pt that poc is not meant for night time o2 use. Routing to Dr Francine Graven to advise about suggestions for night time o2 for traveling.

## 2023-11-27 ENCOUNTER — Telehealth: Payer: Self-pay

## 2023-11-27 MED ORDER — AMLODIPINE BESYLATE 10 MG PO TABS: 10.0000 mg | ORAL_TABLET | Freq: Every day | ORAL | 3 refills | Status: DC

## 2023-11-27 NOTE — Telephone Encounter (Signed)
 Rx sent  Copied from CRM [id number]. Topic: Clinical - Prescription Issue >> Nov 27, 2023 10:19 AM Martin Santiago wrote: Reason for CRM: Pt called stating he needs a auth for medication amLODipine (NORVASC) 10 MG tablet pt is requesting the provider call in authorization for his medication. Please call pt back at [phone]

## 2023-11-27 NOTE — Telephone Encounter (Signed)
 Spoke to patient. He stated that he will reach out to DME company and will call back with update so order can be placed.  Nothing further needed at this time.

## 2023-11-28 DIAGNOSIS — J441 Chronic obstructive pulmonary disease with (acute) exacerbation: Secondary | ICD-10-CM | POA: Diagnosis not present

## 2023-11-28 DIAGNOSIS — Z515 Encounter for palliative care: Secondary | ICD-10-CM | POA: Diagnosis not present

## 2023-11-28 DIAGNOSIS — M62512 Muscle wasting and atrophy, not elsewhere classified, left shoulder: Secondary | ICD-10-CM | POA: Diagnosis not present

## 2023-11-28 DIAGNOSIS — M62511 Muscle wasting and atrophy, not elsewhere classified, right shoulder: Secondary | ICD-10-CM | POA: Diagnosis not present

## 2023-11-28 DIAGNOSIS — J9611 Chronic respiratory failure with hypoxia: Secondary | ICD-10-CM | POA: Diagnosis not present

## 2023-11-28 DIAGNOSIS — J432 Centrilobular emphysema: Secondary | ICD-10-CM | POA: Diagnosis not present

## 2023-11-28 DIAGNOSIS — M158 Other polyosteoarthritis: Secondary | ICD-10-CM | POA: Diagnosis not present

## 2023-11-28 DIAGNOSIS — R278 Other lack of coordination: Secondary | ICD-10-CM | POA: Diagnosis not present

## 2023-11-28 DIAGNOSIS — R296 Repeated falls: Secondary | ICD-10-CM | POA: Diagnosis not present

## 2023-11-28 DIAGNOSIS — N401 Enlarged prostate with lower urinary tract symptoms: Secondary | ICD-10-CM | POA: Diagnosis not present

## 2023-11-28 DIAGNOSIS — R2681 Unsteadiness on feet: Secondary | ICD-10-CM | POA: Diagnosis not present

## 2023-11-28 DIAGNOSIS — R2689 Other abnormalities of gait and mobility: Secondary | ICD-10-CM | POA: Diagnosis not present

## 2023-11-29 DIAGNOSIS — R278 Other lack of coordination: Secondary | ICD-10-CM | POA: Diagnosis not present

## 2023-11-29 DIAGNOSIS — R2689 Other abnormalities of gait and mobility: Secondary | ICD-10-CM | POA: Diagnosis not present

## 2023-11-29 DIAGNOSIS — R2681 Unsteadiness on feet: Secondary | ICD-10-CM | POA: Diagnosis not present

## 2023-11-29 DIAGNOSIS — R296 Repeated falls: Secondary | ICD-10-CM | POA: Diagnosis not present

## 2023-11-29 DIAGNOSIS — M62511 Muscle wasting and atrophy, not elsewhere classified, right shoulder: Secondary | ICD-10-CM | POA: Diagnosis not present

## 2023-11-29 DIAGNOSIS — M158 Other polyosteoarthritis: Secondary | ICD-10-CM | POA: Diagnosis not present

## 2023-11-30 DIAGNOSIS — R2689 Other abnormalities of gait and mobility: Secondary | ICD-10-CM | POA: Diagnosis not present

## 2023-11-30 DIAGNOSIS — R2681 Unsteadiness on feet: Secondary | ICD-10-CM | POA: Diagnosis not present

## 2023-11-30 DIAGNOSIS — M158 Other polyosteoarthritis: Secondary | ICD-10-CM | POA: Diagnosis not present

## 2023-11-30 DIAGNOSIS — R296 Repeated falls: Secondary | ICD-10-CM | POA: Diagnosis not present

## 2023-11-30 DIAGNOSIS — R278 Other lack of coordination: Secondary | ICD-10-CM | POA: Diagnosis not present

## 2023-11-30 DIAGNOSIS — M62511 Muscle wasting and atrophy, not elsewhere classified, right shoulder: Secondary | ICD-10-CM | POA: Diagnosis not present

## 2023-12-05 DIAGNOSIS — R296 Repeated falls: Secondary | ICD-10-CM | POA: Diagnosis not present

## 2023-12-05 DIAGNOSIS — R2681 Unsteadiness on feet: Secondary | ICD-10-CM | POA: Diagnosis not present

## 2023-12-05 DIAGNOSIS — M62511 Muscle wasting and atrophy, not elsewhere classified, right shoulder: Secondary | ICD-10-CM | POA: Diagnosis not present

## 2023-12-05 DIAGNOSIS — R2689 Other abnormalities of gait and mobility: Secondary | ICD-10-CM | POA: Diagnosis not present

## 2023-12-05 DIAGNOSIS — R278 Other lack of coordination: Secondary | ICD-10-CM | POA: Diagnosis not present

## 2023-12-05 DIAGNOSIS — M158 Other polyosteoarthritis: Secondary | ICD-10-CM | POA: Diagnosis not present

## 2023-12-06 DIAGNOSIS — M158 Other polyosteoarthritis: Secondary | ICD-10-CM | POA: Diagnosis not present

## 2023-12-06 DIAGNOSIS — R296 Repeated falls: Secondary | ICD-10-CM | POA: Diagnosis not present

## 2023-12-06 DIAGNOSIS — R2689 Other abnormalities of gait and mobility: Secondary | ICD-10-CM | POA: Diagnosis not present

## 2023-12-06 DIAGNOSIS — M62511 Muscle wasting and atrophy, not elsewhere classified, right shoulder: Secondary | ICD-10-CM | POA: Diagnosis not present

## 2023-12-06 DIAGNOSIS — R278 Other lack of coordination: Secondary | ICD-10-CM | POA: Diagnosis not present

## 2023-12-06 DIAGNOSIS — R2681 Unsteadiness on feet: Secondary | ICD-10-CM | POA: Diagnosis not present

## 2023-12-08 DIAGNOSIS — Z961 Presence of intraocular lens: Secondary | ICD-10-CM | POA: Diagnosis not present

## 2023-12-08 DIAGNOSIS — H40011 Open angle with borderline findings, low risk, right eye: Secondary | ICD-10-CM | POA: Diagnosis not present

## 2023-12-08 DIAGNOSIS — H524 Presbyopia: Secondary | ICD-10-CM | POA: Diagnosis not present

## 2023-12-29 ENCOUNTER — Encounter: Payer: Self-pay | Admitting: Pulmonary Disease

## 2024-01-08 DIAGNOSIS — Z23 Encounter for immunization: Secondary | ICD-10-CM | POA: Diagnosis not present

## 2024-01-09 DIAGNOSIS — N401 Enlarged prostate with lower urinary tract symptoms: Secondary | ICD-10-CM | POA: Diagnosis not present

## 2024-01-09 DIAGNOSIS — J9611 Chronic respiratory failure with hypoxia: Secondary | ICD-10-CM | POA: Diagnosis not present

## 2024-01-09 DIAGNOSIS — Z515 Encounter for palliative care: Secondary | ICD-10-CM | POA: Diagnosis not present

## 2024-01-09 DIAGNOSIS — J432 Centrilobular emphysema: Secondary | ICD-10-CM | POA: Diagnosis not present

## 2024-01-09 DIAGNOSIS — J441 Chronic obstructive pulmonary disease with (acute) exacerbation: Secondary | ICD-10-CM | POA: Diagnosis not present

## 2024-01-10 DIAGNOSIS — E785 Hyperlipidemia, unspecified: Secondary | ICD-10-CM | POA: Diagnosis not present

## 2024-01-11 LAB — LIPID PANEL
Chol/HDL Ratio: 2.8 ratio (ref 0.0–5.0)
Cholesterol, Total: 142 mg/dL (ref 100–199)
HDL: 51 mg/dL (ref >39)
LDL Chol Calc (NIH): 70 mg/dL (ref 0–99)
Triglycerides: 117 mg/dL (ref 0–149)
VLDL Cholesterol Cal: 21 mg/dL (ref 5–40)

## 2024-01-11 LAB — AST: AST: 14 IU/L (ref 0–40)

## 2024-01-11 LAB — ALT: ALT: 6 IU/L (ref 0–44)

## 2024-01-16 ENCOUNTER — Telehealth: Payer: Self-pay

## 2024-01-16 NOTE — Telephone Encounter (Signed)
 Left message on My Chart with lab results per Dr. Vanetta Shawl note. Routed to PCP.

## 2024-01-16 NOTE — Telephone Encounter (Signed)
Pt viewed results on My Chart per Dr. Krasowski's note. Routed to PCP.  

## 2024-01-18 ENCOUNTER — Ambulatory Visit

## 2024-01-18 DIAGNOSIS — Z0000 Encounter for general adult medical examination without abnormal findings: Secondary | ICD-10-CM

## 2024-01-18 DIAGNOSIS — Z23 Encounter for immunization: Secondary | ICD-10-CM

## 2024-01-18 NOTE — Patient Instructions (Signed)
 [name] , Thank you for taking time to come for your Medicare Wellness Visit. I appreciate your ongoing commitment to your health goals. Please review the following plan we discussed and let me know if I can assist you in the future.   Referrals/Orders/Follow-Ups/Clinician Recommendations: none  This is a list of the screening recommended for you and due dates:  Health Maintenance  Topic Date Due   Hepatitis C Screening  Never done   DTaP/Tdap/Td vaccine (1 - Tdap) Never done   Colon Cancer Screening  12/04/2022   Screening for Lung Cancer  02/13/2024   Flu Shot  04/26/2024   COVID-19 Vaccine (9 - Moderna risk 2024-25 season) 07/09/2024   Medicare Annual Wellness Visit  01/17/2025   Pneumonia Vaccine  Completed   Zoster (Shingles) Vaccine  Completed   HPV Vaccine  Aged Out   Meningitis B Vaccine  Aged Out    Advanced directives: (In Chart) A copy of your advanced directives are scanned into your chart should your provider ever need it.  Next Medicare Annual Wellness Visit scheduled for next year: Yes  insert Preventive Care attachment Insert FALL PREVENTION attachment if needed

## 2024-01-18 NOTE — Progress Notes (Signed)
 Subjective:   Martin Santiago is a 73 y.o. who presents for a Medicare Wellness preventive visit.  Visit Complete: Virtual I connected with  Martin Santiago on 01/18/24 by a audio enabled telemedicine application and verified that I am speaking with the correct person using two identifiers.  Patient Location: Home  Provider Location: Home Office  I discussed the limitations of evaluation and management by telemedicine. The patient expressed understanding and agreed to proceed.  Vital Signs: Because this visit was a virtual/telehealth visit, some criteria may be missing or patient reported. Any vitals not documented were not able to be obtained and vitals that have been documented are patient reported.  VideoError- Librarian, academic were attempted between this provider and patient, however failed, due to patient having technical difficulties OR patient did not have access to video capability.  We continued and completed visit with audio only.   Persons Participating in Visit: Patient.  AWV Questionnaire: No: Patient Medicare AWV questionnaire was not completed prior to this visit.  Cardiac Risk Factors include: advanced age (>66men, >65 women);dyslipidemia;hypertension;male gender     Objective:    Today's Vitals   There is no height or weight on file to calculate BMI.     01/18/2024   10:15 AM 05/28/2023    2:31 PM 01/16/2023    1:00 PM 01/06/2022   12:25 PM 06/13/2017   10:42 AM 12/03/2012    7:04 AM  Advanced Directives  Does Patient Have a Medical Advance Directive? Yes No Yes Yes No;Yes Patient has advance directive, copy not in chart  Type of Advance Directive Healthcare Power of [place];Living will  Healthcare Power of [place];Living will Healthcare Power of [place];Living will Living will Living will  Does patient want to make changes to medical advance directive?   No - Patient declined     Copy of Healthcare Power of Attorney in Chart? Yes -  validated most recent copy scanned in chart (See row information)  Yes - validated most recent copy scanned in chart (See row information) Yes - validated most recent copy scanned in chart (See row information)    Would patient like information on creating a medical advance directive?     No - Patient declined   Pre-existing out of facility DNR order (yellow form or pink MOST form)      No    Current Medications (verified) Outpatient Encounter Medications as of 01/18/2024  Medication Sig   albuterol  (PROVENTIL ) (2.5 MG/3ML) 0.083% nebulizer solution Take 3 mLs (2.5 mg total) by nebulization every 4 (four) hours.   albuterol  (VENTOLIN  HFA) 108 (90 Base) MCG/ACT inhaler Inhale 2 puffs into the lungs every 6 (six) hours as needed for wheezing or shortness of breath.   amLODipine  (NORVASC ) 10 MG tablet Take 1 tablet (10 mg total) by mouth daily. Reported on 02/25/2016   aspirin  EC 81 MG tablet Take 1 tablet (81 mg total) by mouth daily. Swallow whole.   BREZTRI  AEROSPHERE 160-9-4.8 MCG/ACT AERO INHALE 2 PUFFS INTO THE LUNGS IN THE MORNING AND AT BEDTIME   losartan  (COZAAR ) 100 MG tablet TAKE 1 TABLET BY MOUTH DAILY   omeprazole  (PRILOSEC) 20 MG capsule TAKE 1 CAPSULE(20 MG) BY MOUTH DAILY (Patient taking differently: Take 20 mg by mouth daily.)   timolol (TIMOPTIC) 0.5 % ophthalmic solution Place 1 drop into both eyes daily.   atorvastatin  (LIPITOR) 20 MG tablet Take 1 tablet (20 mg total) by mouth daily.   azithromycin  (ZITHROMAX ) 250 MG tablet  Take as directed (Patient not taking: Reported on 01/18/2024)   sildenafil  (REVATIO ) 20 MG tablet 3-5 tab prior to sex.(Max use once in 24 hour period) (Patient not taking: Reported on 01/18/2024)   No facility-administered encounter medications on file as of 01/18/2024.    Allergies (verified) Codeine   History: Past Medical History:  Diagnosis Date   Colon polyps    COPD (chronic obstructive pulmonary disease) (HCC)    Crohn's disease (HCC)     Detached retina    Essential hypertension    Tropical sprue    Past Surgical History:  Procedure Laterality Date   BOWEL RESECTION  05/1994   [place]:  ? Crohn's   COLONOSCOPY     Dr Kathrynn Park, [facility]:  Hx polyps. Cannot remmeber date   COLONOSCOPY N/A 12/12/2012   Procedure: COLONOSCOPY;  Surgeon: Suzette Espy, MD;  Location: [facility];  Service: Endoscopy;  Laterality: N/A;  7:30   INGUINAL HERNIA REPAIR Right    RETINAL DETACHMENT SURGERY  07/2006   RETINAL DETACHMENT SURGERY  09/2006   RETINAL DETACHMENT SURGERY  01/2007   Family History  Problem Relation Age of Onset   Colon cancer Maternal Aunt    Breast cancer Mother    Social History   Socioeconomic History   Marital status: Widowed    Spouse name: Not on file   Number of children: 0   Years of education: Not on file   Highest education level: Bachelor's degree (e.g., BA, AB, BS)  Occupational History   Occupation: retired, Counsellor, Engineer, agricultural: SELF EMPLOYED  Tobacco Use   Smoking status: Every Day    Current packs/day: 2.00    Average packs/day: 2.0 packs/day for 55.0 years (110.0 ttl pk-yrs)    Types: Cigarettes   Smokeless tobacco: Never   Tobacco comments:    Smoke 1/4 pack a day. Tay 04/19/22  Substance and Sexual Activity   Alcohol use: Not Currently    Comment: Rare, 3-4 per yr   Drug use: No    Comment: Remote marijuana   Sexual activity: Not Currently  Other Topics Concern   Not on file  Social History Narrative   Wife, Martin Santiago passed away in 12/12/21 from [place].   1 step-daughter who lives in [place].   Social Drivers of Corporate investment banker Strain: Low Risk  (01/18/2024)   Overall Financial Resource Strain (CARDIA)    Difficulty of Paying Living Expenses: Not hard at all  Food Insecurity: No Food Insecurity (01/18/2024)   Hunger Vital Sign    Worried About Running Out of Food in the Last Year: Never true    Ran Out of Food in the Last Year: Never true   Transportation Needs: No Transportation Needs (01/18/2024)   PRAPARE - Administrator, Civil Service (Medical): No    Lack of Transportation (Non-Medical): No  Physical Activity: Inactive (01/18/2024)   Exercise Vital Sign    Days of Exercise per Week: 0 days    Minutes of Exercise per Session: 0 min  Stress: No Stress Concern Present (01/18/2024)   [employer] of Occupational Health - Occupational Stress Questionnaire    Feeling of Stress : Not at all  Social Connections: Moderately Isolated (01/18/2024)   Social Connection and Isolation Panel [NHANES]    Frequency of Communication with Friends and Family: More than three times a week    Frequency of Social Gatherings with Friends and Family: More than three  times a week    Attends Religious Services: 1 to 4 times per year    Active Member of Clubs or Organizations: No    Attends Banker Meetings: Never    Marital Status: Widowed    Tobacco Counseling Ready to quit: No Counseling given: Not Answered Tobacco comments: Smoke 1/4 pack a day. Tay 04/19/22    Clinical Intake:  Pre-visit preparation completed: Yes  Pain : No/denies pain     Nutritional Risks: None Diabetes: No  No results found for: "HGBA1C"   How often do you need to have someone help you when you read instructions, pamphlets, or other written materials from your doctor or pharmacy?: 1 - Never  Interpreter Needed?: No  Information entered by :: NAllen LPN   Activities of Daily Living     01/18/2024   10:08 AM  In your present state of health, do you have any difficulty performing the following activities:  Hearing? 0  Vision? 1  Comment decreased distance  Difficulty concentrating or making decisions? 0  Walking or climbing stairs? 1  Comment sob  Dressing or bathing? 0  Doing errands, shopping? 0  Preparing Food and eating ? N  Using the Toilet? N  In the past six months, have you accidently leaked urine? N  Do  you have problems with loss of bowel control? N  Managing your Medications? N  Managing your Finances? N  Housekeeping or managing your Housekeeping? N    Patient Care Team: Saguier, Edward, PA-C as PCP - General (Internal Medicine)  Indicate any recent Medical Services you may have received from other than [facility] providers in the past year (date may be approximate).     Assessment:   This is a routine wellness examination for Essa.  Hearing/Vision screen Hearing Screening - Comments:: Denies hearing issues Vision Screening - Comments:: Regular eye exams, Dr. Winn Havens, Dr. Juanito Norma   Goals Addressed             This Visit's Progress    Patient Stated       01/18/2024, continue to breath       Depression Screen     01/18/2024   10:18 AM 01/16/2023    1:08 PM 01/06/2022   12:11 PM 08/18/2021    1:59 PM  PHQ 2/9 Scores  PHQ - 2 Score 0 2 1 1   PHQ- 9 Score 3 2      Fall Risk     01/18/2024   10:17 AM 01/16/2023    1:03 PM 01/06/2022   12:25 PM 08/18/2021    1:59 PM 04/21/2017    1:34 PM  Fall Risk   Falls in the past year? 0 1 0 0 No  Comment     Emmi Telephone Survey: data to providers prior to load  Number falls in past yr: 0 0 0 0   Injury with Fall? 0 0 0 0   Risk for fall due to : Impaired mobility;Impaired balance/gait;Medication side effect No Fall Risks No Fall Risks    Follow up Falls prevention discussed;Falls evaluation completed Falls evaluation completed Falls prevention discussed      MEDICARE RISK AT HOME:  Medicare Risk at Home Any stairs in or around the home?: No If so, are there any without handrails?: No Home free of loose throw rugs in walkways, pet beds, electrical cords, etc?: Yes Adequate lighting in your home to reduce risk of falls?: Yes Life alert?: Yes Use of a cane, walker or  w/c?: Yes Grab bars in the bathroom?: Yes Shower chair or bench in shower?: Yes Elevated toilet seat or a handicapped toilet?: Yes  TIMED UP AND GO:  Was the  test performed?  No  Cognitive Function: 6CIT completed        01/18/2024   10:18 AM 01/16/2023    1:23 PM 01/06/2022   12:29 PM  6CIT Screen  What Year? 0 points 0 points 0 points  What month? 0 points 0 points 0 points  What time? 0 points 0 points 0 points  Count back from 20 0 points 0 points 0 points  Months in reverse 0 points 0 points 0 points  Repeat phrase 0 points 0 points 0 points  Total Score 0 points 0 points 0 points    Immunizations Immunization History  Administered Date(s) Administered   Fluad Quad(high Dose 65+) 06/25/2021, 06/02/2022   Influenza, High Dose Seasonal PF 06/15/2017, 06/20/2023   Moderna Covid-19 Vaccine  Bivalent Booster 55yrs & up 07/13/2021, 06/30/2022   Moderna Sars-Covid-2 Vaccination 10/31/2019, 11/29/2019, 07/17/2020, 01/22/2021   Novavax(Covid-19) Vaccine 06/20/2023   PNEUMOCOCCAL CONJUGATE-20 02/02/2022   Pneumococcal Polysaccharide-23 06/15/2017   Respiratory Syncytial Virus Vaccine,Recomb Aduvanted(Arexvy) 06/30/2022   Unspecified SARS-COV-2 Vaccination 01/08/2024   Zoster Recombinant(Shingrix) 07/28/2020, 09/28/2020    Screening Tests Health Maintenance  Topic Date Due   Hepatitis C Screening  Never done   DTaP/Tdap/Td (1 - Tdap) Never done   Colonoscopy  12/04/2022   Lung Cancer Screening  02/13/2024   INFLUENZA VACCINE  04/26/2024   COVID-19 Vaccine (9 - Moderna risk 2024-25 season) 07/09/2024   Medicare Annual Wellness (AWV)  01/17/2025   Pneumonia Vaccine 41+ Years old  Completed   Zoster Vaccines- Shingrix  Completed   HPV VACCINES  Aged Out   Meningococcal B Vaccine  Aged Out    Health Maintenance  Health Maintenance Due  Topic Date Due   Hepatitis C Screening  Never done   DTaP/Tdap/Td (1 - Tdap) Never done   Colonoscopy  12/04/2022   Lung Cancer Screening  02/13/2024   Health Maintenance Items Addressed: Declines colonoscopy. Due for Hep C screening. TDAP due  Additional Screening:  Vision Screening:  Recommended annual ophthalmology exams for early detection of glaucoma and other disorders of the eye.  Dental Screening: Recommended annual dental exams for proper oral hygiene  Community Resource Referral / Chronic Care Management: CRR required this visit?  No   CCM required this visit?  No     Plan:     I have personally reviewed and noted the following in the patient's chart:   Medical and social history Use of alcohol, tobacco or illicit drugs  Current medications and supplements including opioid prescriptions. Patient is not currently taking opioid prescriptions. Functional ability and status Nutritional status Physical activity Advanced directives List of other physicians Hospitalizations, surgeries, and ER visits in previous 12 months Vitals Screenings to include cognitive, depression, and falls Referrals and appointments  In addition, I have reviewed and discussed with patient certain preventive protocols, quality metrics, and best practice recommendations. A written personalized care plan for preventive services as well as general preventive health recommendations were provided to patient.     Areatha Beecham, LPN   1/61/0960   After Visit Summary: (MyChart) Due to this being a telephonic visit, the after visit summary with patients personalized plan was offered to patient via MyChart   Notes: Nothing significant to report at this time.

## 2024-01-19 ENCOUNTER — Telehealth: Payer: Self-pay

## 2024-01-19 NOTE — Telephone Encounter (Signed)
Pt viewed results on My Chart per Dr. Krasowski's note. Routed to PCP.  

## 2024-02-06 DIAGNOSIS — J432 Centrilobular emphysema: Secondary | ICD-10-CM | POA: Diagnosis not present

## 2024-02-06 DIAGNOSIS — N401 Enlarged prostate with lower urinary tract symptoms: Secondary | ICD-10-CM | POA: Diagnosis not present

## 2024-02-06 DIAGNOSIS — Z515 Encounter for palliative care: Secondary | ICD-10-CM | POA: Diagnosis not present

## 2024-02-06 DIAGNOSIS — J9611 Chronic respiratory failure with hypoxia: Secondary | ICD-10-CM | POA: Diagnosis not present

## 2024-02-06 DIAGNOSIS — J441 Chronic obstructive pulmonary disease with (acute) exacerbation: Secondary | ICD-10-CM | POA: Diagnosis not present

## 2024-02-19 ENCOUNTER — Other Ambulatory Visit: Payer: Self-pay | Admitting: Medical

## 2024-02-20 ENCOUNTER — Other Ambulatory Visit: Payer: Self-pay | Admitting: Medical

## 2024-02-21 ENCOUNTER — Ambulatory Visit: Payer: Self-pay | Admitting: Pulmonary Disease

## 2024-02-21 ENCOUNTER — Other Ambulatory Visit: Payer: Self-pay | Admitting: Acute Care

## 2024-02-21 DIAGNOSIS — Z122 Encounter for screening for malignant neoplasm of respiratory organs: Secondary | ICD-10-CM

## 2024-02-21 DIAGNOSIS — Z87891 Personal history of nicotine dependence: Secondary | ICD-10-CM

## 2024-02-21 DIAGNOSIS — F17210 Nicotine dependence, cigarettes, uncomplicated: Secondary | ICD-10-CM

## 2024-02-21 NOTE — Telephone Encounter (Signed)
 Copied from CRM [id number]. Topic: Clinical - Red Word Triage >> Feb 21, 2024  2:21 PM Martin Santiago wrote: Red Word that prompted transfer to Nurse Triage: Pt is trying to schedule 6 mo follow up with Dr. Diania Fortes in July (afternoon appt if available after 1pm), however, he mentioned he is experiencing shortness of breath that is worse since being seen in Jan of 2025.  TRIAGE SUMMARY NOTE: Pt originally calling to set up appt further out with Dr. Diania Fortes since knows visit will be due, pt reporting "overall gradual" worsening of SOB since January, confirms coughing up "congestion" but no yellow or green, occasional wheezing, but mentions having to use his rescue inhaler much more than he used to, using 2-3x/day, also having to use his oxygen  concentrator for sleep during the day at times as well over the past few days. Advised pt be examined in next 4 hours, no pulm availability, advised could be seen with PCP, pt reporting he was not looking for "urgent" appt, advised that sending HP message to pulm for call back to pt with other appt options and/or further recommendations, advised call back if worsening or new symptoms. Pt verbalized understanding. Please advise.  E2C2 Pulmonary Triage - Initial Assessment Questions "Chief Complaint (e.g., cough, sob, wheezing, fever, chills, sweat or additional symptoms) *Go to specific symptom protocol after initial questions. SOB been worsening since visit in January overall Nothing sudden just overall gradual worsening Occasionally wheezing and coughing Coughing up congestion no yellow or green No chest pain, dizziness, weakness more than usual, fever  "Have you used your inhalers/maintenance medication?" Yes If yes, "What medications?" Used to very seldom use the rescue inhaler, not using nebulizer any more than usual  If inhaler, ask "How many puffs and how often?" Note: Review instructions on medication in the chart. Rescue inhaler using it probably 2-3x/day,  usually gets relief each time Probably using nebulizer little bit less  OXYGEN : "Do you wear supplemental oxygen ?" Yes If yes, "How many liters are you supposed to use?" Oxygen  concentrator for sleeping and portable oxygen  but seldom use it, oxygen  concentrator used it even some during waking hours, not using portable oxygen  more often  "Do you monitor your oxygen  levels?" No  Reason for Disposition  [1] MILD difficulty breathing (e.g., minimal/no SOB at rest, SOB with walking, pulse <100) AND [2] NEW-onset or WORSE than normal  Protocols used: Breathing Difficulty-A-AH

## 2024-02-21 NOTE — Telephone Encounter (Signed)
 Dr Diania Fortes, could we use one of your held slots in June to see this pt?  Please see msg from E2C2 RN below, thanks!

## 2024-02-22 NOTE — Telephone Encounter (Signed)
 Routing to front desk for scheduling

## 2024-03-12 ENCOUNTER — Encounter: Payer: Self-pay | Admitting: Pulmonary Disease

## 2024-03-12 ENCOUNTER — Ambulatory Visit (INDEPENDENT_AMBULATORY_CARE_PROVIDER_SITE_OTHER): Admitting: Pulmonary Disease

## 2024-03-12 VITALS — BP 122/82 | HR 91 | Ht 66.0 in | Wt 132.0 lb

## 2024-03-12 DIAGNOSIS — J441 Chronic obstructive pulmonary disease with (acute) exacerbation: Secondary | ICD-10-CM

## 2024-03-12 MED ORDER — AZITHROMYCIN 250 MG PO TABS: 250.0000 mg | ORAL_TABLET | Freq: Every day | ORAL | 5 refills | Status: DC

## 2024-03-12 MED ORDER — PREDNISONE 10 MG PO TABS: ORAL_TABLET | ORAL | 0 refills | Status: AC

## 2024-03-12 NOTE — Patient Instructions (Addendum)
 Continue breztri  inhaler 2 puffs twice daily - rinse mouth out after each use  We will order you Ohtuvayre  Nebs to be used twice daily  Continue as needed albuterol  inhaler 1-2 puffs or albuterol  nebulizer solution every 4-6 hours  Start prednisone  taper 40mg  daily x 3 days 30mg  daily x 3 days 20mg  daily x 3 days 10mg  daily x 3 days  Start Azithromycin  250mg  daily   We will consider adding a daily steroid if you're continuing to have issues with your breathing.  Follow up in 2 months

## 2024-03-12 NOTE — Progress Notes (Signed)
 Synopsis: Referred in September 2022 for COPD  Subjective:   PATIENT ID: Martin Santiago, Martin Santiago  HPI  Chief Complaint  Patient presents with   Follow-up   Martin Santiago is a 73 year old male, daily smoker with hypertension and emphysema who returns to pulmonary clinic for COPD follow up.   He resides in an independent living facility and has recently required continuous oxygen  use, even at rest, over the past few days. He experiences increased cough and wheezing, along with a constant postnasal drip and changes in his voice. No fever or other systemic symptoms are present.  He has a portable oxygen  concentrator for outings, although he rarely uses it. He has been prescribed steroids in the past, with varying degrees of effectiveness: significant improvement during pollen season last spring, less so in the fall, and some help in January this year.  He is scheduled for a low dose CT scan tomorrow as part of his ongoing care.  He is followed by palliative care and asked whether he needed daily prednisone  for his breathing symptoms.   OV 10/19/23 He presents for an assessment of his need for portable oxygen . He reports a decrease in his overall ability to perform daily activities, including walking from his sofa to the dining room, a distance of approximately 250 feet, without needing to stop to catch his breath. He has been participating in physical and occupational therapy for several hours a week, which prompted the question of whether portable oxygen  would be beneficial.  The patient also reports a persistent dryness in his chest, despite using a nebulizer. He describes difficulty in clearing his chest congestion, noting that the mucus is thicker than usual. He recalls that on two previous occasions, he was prescribed an antibiotic and prednisone , which seemed to help with similar symptoms.  In addition to his pulmonary concerns, the patient mentions  a recent change in his living situation, moving to an independent retirement living community.   OV 06/20/23 They report that their primary care doctor prescribed prednisone , which they have not yet started. They describe their breathing as congested and have noticed an increase in the use of their albuterol  inhaler.   The patient is currently using a Breztri  inhaler twice a day and an albuterol  inhaler as needed.   He is smoking 5-6 cigs daily. He received his motorized scooter after last visit.  OV 10/24/22 He continues to have dyspnea and decreased stamina. He has increased congestion over the past few days. He continues to smoke 4-6 cigarettes per day.   OV 04/19/22 PFTs today show severe obstruction, air trapping and severe diffusion defect.  He is now riding a motorized scooter around the grocery store due to dyspnea.   His wife passed away in December 26, 2023, he continues to grieve her loss. He is considering some travel but is concerned about traveling with a nebulizer machine.  OV 09/24/21 Overnight oximitry showed he spent 7 hours and 17 minutes with an SpO2 less than 88%. Patient was offered supplemental oxygen  but declined at that time. He was started on breztri  inhaler at last visit with good improvement in his shortness of breath. He is also using albuterol  nebulizer twice daily with relief. He reports he is sleeping better since starting inhaler therapy.   OV 06/25/21 He has had increasing shortness of breath, cough and wheezing over recent months.  He has been on Bevespi inhaler over the last few years with good effect but he  has been having more frequent breakthrough shortness of breath and wheezing recently.  He has an as needed albuterol  inhaler which he uses but does not find much relief with this inhaler.  He does not have a nebulizer machine at home.  His cough is productive with sputum production on a daily basis.  He continues to smoke daily and has smoked over the past 45 years.   He has a 60+ pack year smoking history.  High-resolution CT scan from 2017 shows extensive centrilobular emphysematous changes.  He has been seeing an ophthalmologist for retinal detachment but does not recall being treated for glaucoma.  Under his problem list there is listed secondary open-angle glaucoma of the left eye.  Past Medical History:  Diagnosis Date   Colon polyps    COPD (chronic obstructive pulmonary disease) (HCC)    Crohn's disease (HCC)    Detached retina    Essential hypertension    Tropical sprue      Family History  Problem Relation Age of Onset   Colon cancer Maternal Aunt    Breast cancer Mother      Social History   Socioeconomic History   Marital status: Widowed    Spouse name: Not on file   Number of children: 0   Years of education: Not on file   Highest education level: Bachelor's degree (e.g., BA, AB, BS)  Occupational History   Occupation: retired, Counsellor, Engineer, agricultural: SELF EMPLOYED  Tobacco Use   Smoking status: Every Day    Current packs/day: 2.00    Average packs/day: 2.0 packs/day for 55.0 years (110.0 ttl pk-yrs)    Types: Cigarettes   Smokeless tobacco: Never   Tobacco comments:    Smoke 1/4 pack a day. Martin Santiago 04/19/22  Substance and Sexual Activity   Alcohol use: Not Currently    Comment: Rare, 3-4 per yr   Drug use: No    Comment: Remote marijuana   Sexual activity: Not Currently  Other Topics Concern   Not on file  Social History Narrative   Wife, Martin Santiago passed away in January 02, 2022 from [place].   1 step-daughter who lives in [place].   Social Drivers of Corporate investment banker Strain: Low Risk  (01/18/2024)   Overall Financial Resource Strain (CARDIA)    Difficulty of Paying Living Expenses: Not hard at all  Food Insecurity: No Food Insecurity (01/18/2024)   Hunger Vital Sign    Worried About Running Out of Food in the Last Year: Never true    Ran Out of Food in the Last Year: Never true   Transportation Needs: No Transportation Needs (01/18/2024)   PRAPARE - Administrator, Civil Service (Medical): No    Lack of Transportation (Non-Medical): No  Physical Activity: Inactive (01/18/2024)   Exercise Vital Sign    Days of Exercise per Week: 0 days    Minutes of Exercise per Session: 0 min  Stress: No Stress Concern Present (01/18/2024)   [employer] of Occupational Health - Occupational Stress Questionnaire    Feeling of Stress : Not at all  Social Connections: Moderately Isolated (01/18/2024)   Social Connection and Isolation Panel    Frequency of Communication with Friends and Family: More than three times a week    Frequency of Social Gatherings with Friends and Family: More than three times a week    Attends Religious Services: 1 to 4 times per year    Active Member of [employer]  or Organizations: No    Attends Banker Meetings: Never    Marital Status: Widowed  Intimate Partner Violence: Not At Risk (01/18/2024)   Humiliation, Afraid, Rape, and Kick questionnaire    Fear of Current or Ex-Partner: No    Emotionally Abused: No    Physically Abused: No    Sexually Abused: No     Allergies  Allergen Reactions   Codeine Nausea And Vomiting and Other (See Comments)    Other reaction(s): Flushing (ALLERGY/intolerance) fainting     Outpatient Medications Prior to Visit  Medication Sig Dispense Refill   albuterol  (PROVENTIL ) (2.5 MG/3ML) 0.083% nebulizer solution Take 3 mLs (2.5 mg total) by nebulization every 4 (four) hours. 540 mL 11   albuterol  (VENTOLIN  HFA) 108 (90 Base) MCG/ACT inhaler Inhale 2 puffs into the lungs every 6 (six) hours as needed for wheezing or shortness of breath. 18 g 11   amLODipine  (NORVASC ) 10 MG tablet Take 1 tablet (10 mg total) by mouth daily. Reported on 02/25/2016 90 tablet 3   aspirin  EC 81 MG tablet Take 1 tablet (81 mg total) by mouth daily. Swallow whole.     atorvastatin  (LIPITOR) 20 MG tablet Take 1 tablet (20  mg total) by mouth daily. 90 tablet 3   BREZTRI  AEROSPHERE 160-9-4.8 MCG/ACT AERO INHALE 2 PUFFS INTO THE LUNGS IN THE MORNING AND AT BEDTIME 10.7 g 5   losartan  (COZAAR ) 100 MG tablet TAKE 1 TABLET BY MOUTH DAILY 90 tablet 0   omeprazole  (PRILOSEC) 20 MG capsule TAKE 1 CAPSULE(20 MG) BY MOUTH DAILY 90 capsule 2   sildenafil  (REVATIO ) 20 MG tablet 3-5 tab prior to sex.(Max use once in 24 hour period) (Patient not taking: Reported on 01/18/2024) 50 tablet 0   timolol (TIMOPTIC) 0.5 % ophthalmic solution Place 1 drop into both eyes daily.     azithromycin  (ZITHROMAX ) 250 MG tablet Take as directed (Patient not taking: Reported on 01/18/2024) 6 tablet 0   No facility-administered medications prior to visit.    Review of Systems  Constitutional:  Negative for chills, fever, malaise/fatigue and weight loss.  HENT:  Negative for congestion, sinus pain and sore throat.   Eyes: Negative.   Respiratory:  Positive for cough, sputum production, shortness of breath and wheezing. Negative for hemoptysis.   Cardiovascular:  Negative for chest pain, palpitations, orthopnea, claudication and leg swelling.  Gastrointestinal:  Negative for abdominal pain, heartburn, nausea and vomiting.  Genitourinary: Negative.   Musculoskeletal:  Negative for joint pain and myalgias.  Skin:  Negative for rash.  Neurological:  Negative for weakness.  Endo/Heme/Allergies: Negative.   Psychiatric/Behavioral: Negative.     Objective:   Vitals:   03/12/24 1543  BP: 122/82  Pulse: 91  SpO2: 91%  Weight: 132 lb (59.9 kg)  Height: 5' 6 (1.676 m)   Physical Exam Constitutional:      General: He is not in acute distress.    Appearance: Normal appearance.  HENT:     Head: Normocephalic and atraumatic.   Eyes:     General: No scleral icterus.    Conjunctiva/sclera: Conjunctivae normal.    Cardiovascular:     Rate and Rhythm: Normal rate and regular rhythm.     Pulses: Normal pulses.     Heart sounds: Normal  heart sounds. No murmur heard. Pulmonary:     Effort: Pulmonary effort is normal.     Breath sounds: Decreased air movement present. Wheezing (mild scattered) present. No rhonchi or rales.   Musculoskeletal:  Right lower leg: No edema.     Left lower leg: No edema.   Skin:    General: Skin is warm and dry.   Neurological:     General: No focal deficit present.     Mental Status: He is alert.    CBC    Component Value Date/Time   WBC 5.8 04/06/2023 1129   RBC 5.09 04/06/2023 1129   HGB 14.0 04/06/2023 1129   HCT 43.4 04/06/2023 1129   PLT 255.0 04/06/2023 1129   MCV 85.3 04/06/2023 1129   MCH 31.1 06/14/2017 0705   MCHC 32.4 04/06/2023 1129   RDW 16.2 (H) 04/06/2023 1129   LYMPHSABS 0.8 04/06/2023 1129   MONOABS 0.7 04/06/2023 1129   EOSABS 0.2 04/06/2023 1129   BASOSABS 0.1 04/06/2023 1129   Chest imaging: HRCT Chest 2017 1. Moderate to severe centrilobular emphysema. No evidence of interstitial lung disease. 2. Basilar subpleural nodules are likely subpleural lymph nodes.  PFT:    Latest Ref Rng & Units 04/19/2022   11:51 AM  PFT Results  FVC-Pre L 2.58   FVC-Predicted Pre % 69   FVC-Post L 2.74   FVC-Predicted Post % 74   Pre FEV1/FVC % % 39   Post FEV1/FCV % % 39   FEV1-Pre L 1.01   FEV1-Predicted Pre % 37   FEV1-Post L 1.07   DLCO uncorrected ml/min/mmHg 7.75   DLCO UNC% % 34   DLCO corrected ml/min/mmHg 7.75   DLCO COR %Predicted % 34   DLVA Predicted % 37   TLC L 7.91   TLC % Predicted % 127   RV % Predicted % 196     Assessment & Plan:   COPD with acute exacerbation (HCC) - Plan: predniSONE  (DELTASONE ) 10 MG tablet, azithromycin  (ZITHROMAX ) 250 MG tablet  Discussion: Martin Santiago is a 73 year old male, daily smoker with hypertension and emphysema who returns to pulmonary clinic for COPD follow up.   Chronic Obstructive Pulmonary Disease (COPD) - Start prednisone  taper  - start daily azithromycin  250mg  daily - Continue breztri  inhaler  2 puffs twice daily - start Ohtuvayre  Nebulizer treatments twice daily, paperwork completed today - Continue albuterol  as needed - will consider adding daily steroid if still not better at follow up  Chronic Hypoxemic Respiratory failure - continue supplemental oxygen  therapy - Goal SpO2 88%  Smoking Cessation Patient continues to smoke 5-6 cigarettes per day. Discussed the impact of continued smoking on COPD progression and overall health. -Encouraged to continue efforts to quit smoking.  Follow up in 2 months.   Martin German, MD [facility] Office: [phone]   Current Outpatient Medications:    azithromycin  (ZITHROMAX ) 250 MG tablet, Take 1 tablet (250 mg total) by mouth daily., Disp: 30 tablet, Rfl: 5   predniSONE  (DELTASONE ) 10 MG tablet, Take 4 tablets (40 mg total) by mouth daily with breakfast for 3 days, THEN 3 tablets (30 mg total) daily with breakfast for 3 days, THEN 2 tablets (20 mg total) daily with breakfast for 3 days, THEN 1 tablet (10 mg total) daily with breakfast for 3 days., Disp: 30 tablet, Rfl: 0   albuterol  (PROVENTIL ) (2.5 MG/3ML) 0.083% nebulizer solution, Take 3 mLs (2.5 mg total) by nebulization every 4 (four) hours., Disp: 540 mL, Rfl: 11   albuterol  (VENTOLIN  HFA) 108 (90 Base) MCG/ACT inhaler, Inhale 2 puffs into the lungs every 6 (six) hours as needed for wheezing or shortness of breath., Disp: 18 g, Rfl: 11  amLODipine  (NORVASC ) 10 MG tablet, Take 1 tablet (10 mg total) by mouth daily. Reported on 02/25/2016, Disp: 90 tablet, Rfl: 3   aspirin  EC 81 MG tablet, Take 1 tablet (81 mg total) by mouth daily. Swallow whole., Disp: , Rfl:    atorvastatin  (LIPITOR) 20 MG tablet, Take 1 tablet (20 mg total) by mouth daily., Disp: 90 tablet, Rfl: 3   BREZTRI  AEROSPHERE 160-9-4.8 MCG/ACT AERO, INHALE 2 PUFFS INTO THE LUNGS IN THE MORNING AND AT BEDTIME, Disp: 10.7 g, Rfl: 5   losartan  (COZAAR ) 100 MG tablet, TAKE 1 TABLET BY MOUTH DAILY,  Disp: 90 tablet, Rfl: 0   omeprazole  (PRILOSEC) 20 MG capsule, TAKE 1 CAPSULE(20 MG) BY MOUTH DAILY, Disp: 90 capsule, Rfl: 2   sildenafil  (REVATIO ) 20 MG tablet, 3-5 tab prior to sex.(Max use once in 24 hour period) (Patient not taking: Reported on 01/18/2024), Disp: 50 tablet, Rfl: 0   timolol (TIMOPTIC) 0.5 % ophthalmic solution, Place 1 drop into both eyes daily., Disp: , Rfl:

## 2024-03-13 ENCOUNTER — Ambulatory Visit (HOSPITAL_BASED_OUTPATIENT_CLINIC_OR_DEPARTMENT_OTHER)
Admission: RE | Admit: 2024-03-13 | Discharge: 2024-03-13 | Disposition: A | Discharge status: Home / Self Care | Source: Ambulatory Visit | Attending: Acute Care | Admitting: Acute Care

## 2024-03-13 ENCOUNTER — Telehealth: Payer: Self-pay

## 2024-03-13 DIAGNOSIS — Z122 Encounter for screening for malignant neoplasm of respiratory organs: Secondary | ICD-10-CM | POA: Diagnosis not present

## 2024-03-13 DIAGNOSIS — F17210 Nicotine dependence, cigarettes, uncomplicated: Secondary | ICD-10-CM | POA: Diagnosis not present

## 2024-03-13 DIAGNOSIS — Z87891 Personal history of nicotine dependence: Secondary | ICD-10-CM | POA: Diagnosis not present

## 2024-03-13 NOTE — Telephone Encounter (Signed)
 Received Ohtuvayre new start paperwork. Completed form and faxed with clinicals and insurance card copy to [facility]   Phone#: [phone] Fax#: [phone]

## 2024-03-14 NOTE — Telephone Encounter (Signed)
 Received fax from [employer] with summary of benefits. Referral form for Ohtuvayre  received. Rx will be triaged to [facility].. Once benefits investigation completed, pharmacy will reach out the patient to schedule shipment. If medication is unaffordable, patient will need to express financial hardship to be referred back to [place] [facility] for patient assistance program pre-screening.   Patient ID: [id number] Pharmacy phone: [phone] [place] [facility] Phone#: [phone]

## 2024-03-19 ENCOUNTER — Telehealth: Payer: Self-pay | Admitting: Medical

## 2024-03-19 NOTE — Telephone Encounter (Signed)
 Copied from CRM [id number]. Topic: Clinical - Medication Refill >> Mar 19, 2024  1:01 PM Leah C wrote: Medication: losartan  (COZAAR ) 100 MG tablet  Has the patient contacted their pharmacy? Yes. Pharmacy called and stated to not have received the script for Losartan .  (Agent: If no, request that the patient contact the pharmacy for the refill. If patient does not wish to contact the pharmacy document the reason why and proceed with request.) (Agent: If yes, when and what did the pharmacy advise?)  This is the patient's preferred pharmacy:  [facility] - [place], [place] - [address] [place] [address] Phone: [phone] Fax: [phone]   Is this the correct pharmacy for this prescription? Yes If no, delete pharmacy and type the correct one.   Has the prescription been filled recently? Yes  Is the patient out of the medication? Yes  Has the patient been seen for an appointment in the last year OR does the patient have an upcoming appointment? Yes  Can we respond through MyChart? Yes  Agent: Please be advised that Rx refills may take up to 3 business days. We ask that you follow-up with your pharmacy.

## 2024-03-21 ENCOUNTER — Other Ambulatory Visit: Payer: Self-pay | Admitting: Medical

## 2024-03-21 NOTE — Telephone Encounter (Signed)
 Copied from CRM [id number]. Topic: Clinical - Medication Refill >> Mar 21, 2024  4:03 PM Avram G wrote: Medication: losartan  (COZAAR ) 100 MG tablet [[id number]]  Has the patient contacted their pharmacy? Yes (Agent: If no, request that the patient contact the pharmacy for the refill. If patient does not wish to contact the pharmacy document the reason why and proceed with request.) (Agent: If yes, when and what did the pharmacy advise?)  This is the patient's preferred pharmacy:  [facility] - [place], [place] - [address] [place] [address] Phone: [phone] Fax: [phone]   Phone: [phone] Fax: [phone]  Is this the correct pharmacy for this prescription? Yes If no, delete pharmacy and type the correct one.   Has the prescription been filled recently? No  Is the patient out of the medication? Yes  Has the patient been seen for an appointment in the last year OR does the patient have an upcoming appointment? Yes  Can we respond through MyChart? Yes  Agent: Please be advised that Rx refills may take up to 3 business days. We ask that you follow-up with your pharmacy.

## 2024-03-25 ENCOUNTER — Telehealth: Payer: Self-pay | Admitting: *Deleted

## 2024-03-25 NOTE — Telephone Encounter (Signed)
 Call report from [facility]:  IMPRESSION: 1. Partially visualized large infrarenal abdominal aortic aneurysm measuring at least 9.4 cm diameter. Recommend dedicated CT angiogram of the abdomen and pelvis at this time and referral to or continued care with vascular specialist. (Ref.: J Vasc Surg. 2018; 67:2-77 and J Am Coll Radiol 2013;10(10):789-794.) 2. Lung-RADS 3, probably benign findings. Short-term follow-up in 6 months is recommended with repeat low-dose chest CT without contrast (please use the following order, CT CHEST LCS NODULE FOLLOW-UP W/O CM). Several new indistinct patchy clustered pulmonary nodules throughout the periphery of both lungs, largest 5.9 mm in the peripheral left upper lobe, favor infectious or inflammatory etiology. 3. Three-vessel coronary atherosclerosis. 4. Aortic Atherosclerosis (ICD10-I70.0) and Emphysema (ICD10-J43.9).

## 2024-04-01 ENCOUNTER — Telehealth: Payer: Self-pay | Admitting: Pulmonary Disease

## 2024-04-01 DIAGNOSIS — J449 Chronic obstructive pulmonary disease, unspecified: Secondary | ICD-10-CM

## 2024-04-01 NOTE — Telephone Encounter (Unsigned)
 Copied from CRM [id number]. Topic: Clinical - Medication Refill >> Apr 01, 2024  4:03 PM Joesph PARAS wrote: Medication: Administration Set for OHTUVAYRE   Has the patient contacted their pharmacy? Yes   This is the patient's preferred pharmacy:  [facility] Phone: [phone] Fax: [phone]  Is this the correct pharmacy for this prescription? Yes If no, delete pharmacy and type the correct one.   Has the prescription been filled recently? No  Is the patient out of the medication? Yes - refill ready to ship soon  Has the patient been seen for an appointment in the last year OR does the patient have an upcoming appointment? Yes  Can we respond through MyChart? Yes  Agent: Please be advised that Rx refills may take up to 3 business days. We ask that you follow-up with your pharmacy.

## 2024-04-04 MED ORDER — OHTUVAYRE 3 MG/2.5ML IN SUSP: 3.0000 mg | Freq: Two times a day (BID) | RESPIRATORY_TRACT | 1 refills | Status: DC

## 2024-04-04 NOTE — Telephone Encounter (Signed)
 Rx for Ohtuvayre  sent to [facility]  Sherry Pennant, PharmD, MPH, BCPS, CPP Clinical Pharmacist (Rheumatology and Pulmonology)

## 2024-04-08 ENCOUNTER — Other Ambulatory Visit: Payer: Self-pay | Admitting: Acute Care

## 2024-04-08 DIAGNOSIS — I714 Abdominal aortic aneurysm, without rupture, unspecified: Secondary | ICD-10-CM

## 2024-04-08 NOTE — Progress Notes (Signed)
 Pt. Will be referred to vascular surgery for follow up of AAA.

## 2024-04-09 ENCOUNTER — Telehealth: Payer: Self-pay | Admitting: Acute Care

## 2024-04-09 ENCOUNTER — Other Ambulatory Visit: Payer: Self-pay | Admitting: Medical

## 2024-04-09 DIAGNOSIS — R911 Solitary pulmonary nodule: Secondary | ICD-10-CM

## 2024-04-09 MED ORDER — AMLODIPINE BESYLATE 10 MG PO TABS: 10.0000 mg | ORAL_TABLET | Freq: Every day | ORAL | 0 refills | Status: DC

## 2024-04-09 MED ORDER — LOSARTAN POTASSIUM 100 MG PO TABS: 100.0000 mg | ORAL_TABLET | Freq: Every day | ORAL | 0 refills | Status: DC

## 2024-04-09 NOTE — Telephone Encounter (Signed)
 Patient had a LDCT on 03/13/2024 that resulted as an LR3. An incidental finding of 9.4cm AAA was noted. Lauraine referred the pt to vascular surgery on 04/08/2024 as an urgent referral. Will need to call patient to inform him of the LR3 results, the AAA and referral, along with the other findings of atherosclerosis and emphysema. Pts PCP will need to be notified as well of the findings and plan for pt. Will need to order 6 month follow up LDCT due to LR3 results.    IMPRESSION: 1. Partially visualized large infrarenal abdominal aortic aneurysm measuring at least 9.4 cm diameter. Recommend dedicated CT angiogram of the abdomen and pelvis at this time and referral to or continued care with vascular specialist. (Ref.: J Vasc Surg. 2018; 67:2-77 and J Am Coll Radiol 2013;10(10):789-794.) 2. Lung-RADS 3, probably benign findings. Short-term follow-up in 6 months is recommended with repeat low-dose chest CT without contrast (please use the following order, CT CHEST LCS NODULE FOLLOW-UP W/O CM). Several new indistinct patchy clustered pulmonary nodules throughout the periphery of both lungs, largest 5.9 mm in the peripheral left upper lobe, favor infectious or inflammatory etiology. 3. Three-vessel coronary atherosclerosis. 4. Aortic Atherosclerosis (ICD10-I70.0) and Emphysema (ICD10-J43.9).   These results will be called to the ordering clinician or representative by the Radiologist Assistant, and communication documented in the PACS or [employer].     Electronically Signed   By: Selinda DELENA Blue M.D.   On: 03/22/2024 17:50

## 2024-04-09 NOTE — Telephone Encounter (Signed)
 See provider note 04/09/2024

## 2024-04-09 NOTE — Telephone Encounter (Signed)
 Copied from CRM [id number]. Topic: Clinical - Medication Refill >> Apr 09, 2024 11:05 AM [place] wrote: Medication: amLODipine  (NORVASC ) 10 MG tablet ; losartan  (COZAAR ) 100 MG tablet  Has the patient contacted their pharmacy? Yes (Agent: If no, request that the patient contact the pharmacy for the refill. If patient does not wish to contact the pharmacy document the reason why and proceed with request.) (Agent: If yes, when and what did the pharmacy advise?)  This is the patient's preferred pharmacy:  [facility] - [place], [place] - [address] [place] [address] Phone: [phone] Fax: [phone]  Is this the correct pharmacy for this prescription? Yes If no, delete pharmacy and type the correct one.   Has the prescription been filled recently? Yes  Is the patient out of the medication? Yes  Has the patient been seen for an appointment in the last year OR does the patient have an upcoming appointment? Yes  Can we respond through MyChart? Yes  Agent: Please be advised that Rx refills may take up to 3 business days. We ask that you follow-up with your pharmacy.

## 2024-04-10 ENCOUNTER — Other Ambulatory Visit: Payer: Self-pay

## 2024-04-10 ENCOUNTER — Ambulatory Visit (HOSPITAL_COMMUNITY)
Admission: RE | Admit: 2024-04-10 | Discharge: 2024-04-10 | Disposition: A | Discharge status: Home / Self Care | Source: Ambulatory Visit | Attending: Vascular Surgery

## 2024-04-10 ENCOUNTER — Ambulatory Visit (HOSPITAL_BASED_OUTPATIENT_CLINIC_OR_DEPARTMENT_OTHER)
Admission: RE | Admit: 2024-04-10 | Discharge: 2024-04-10 | Disposition: A | Discharge status: Home / Self Care | Source: Ambulatory Visit | Attending: Vascular Surgery | Admitting: Vascular Surgery

## 2024-04-10 ENCOUNTER — Ambulatory Visit: Admitting: Vascular Surgery

## 2024-04-10 ENCOUNTER — Telehealth: Payer: Self-pay

## 2024-04-10 ENCOUNTER — Encounter: Payer: Self-pay | Admitting: Vascular Surgery

## 2024-04-10 VITALS — BP 132/88 | HR 73 | Temp 98.0°F | Ht 66.0 in | Wt 132.0 lb

## 2024-04-10 DIAGNOSIS — I7143 Infrarenal abdominal aortic aneurysm, without rupture: Secondary | ICD-10-CM | POA: Diagnosis not present

## 2024-04-10 DIAGNOSIS — F17210 Nicotine dependence, cigarettes, uncomplicated: Secondary | ICD-10-CM | POA: Diagnosis not present

## 2024-04-10 DIAGNOSIS — I878 Other specified disorders of veins: Secondary | ICD-10-CM

## 2024-04-10 DIAGNOSIS — K219 Gastro-esophageal reflux disease without esophagitis: Secondary | ICD-10-CM | POA: Diagnosis not present

## 2024-04-10 DIAGNOSIS — I771 Stricture of artery: Secondary | ICD-10-CM | POA: Diagnosis not present

## 2024-04-10 DIAGNOSIS — Z803 Family history of malignant neoplasm of breast: Secondary | ICD-10-CM | POA: Diagnosis not present

## 2024-04-10 DIAGNOSIS — I959 Hypotension, unspecified: Secondary | ICD-10-CM | POA: Diagnosis not present

## 2024-04-10 DIAGNOSIS — Z885 Allergy status to narcotic agent status: Secondary | ICD-10-CM | POA: Diagnosis not present

## 2024-04-10 DIAGNOSIS — Z7951 Long term (current) use of inhaled steroids: Secondary | ICD-10-CM | POA: Diagnosis not present

## 2024-04-10 DIAGNOSIS — I2510 Atherosclerotic heart disease of native coronary artery without angina pectoris: Secondary | ICD-10-CM | POA: Diagnosis not present

## 2024-04-10 DIAGNOSIS — K683 Retroperitoneal hematoma: Secondary | ICD-10-CM | POA: Diagnosis not present

## 2024-04-10 DIAGNOSIS — H409 Unspecified glaucoma: Secondary | ICD-10-CM | POA: Diagnosis not present

## 2024-04-10 DIAGNOSIS — R202 Paresthesia of skin: Secondary | ICD-10-CM | POA: Diagnosis not present

## 2024-04-10 DIAGNOSIS — Z860100 Personal history of colon polyps, unspecified: Secondary | ICD-10-CM | POA: Diagnosis not present

## 2024-04-10 DIAGNOSIS — I10 Essential (primary) hypertension: Secondary | ICD-10-CM | POA: Diagnosis not present

## 2024-04-10 DIAGNOSIS — I714 Abdominal aortic aneurysm, without rupture, unspecified: Secondary | ICD-10-CM | POA: Diagnosis not present

## 2024-04-10 DIAGNOSIS — K5090 Crohn's disease, unspecified, without complications: Secondary | ICD-10-CM | POA: Diagnosis not present

## 2024-04-10 DIAGNOSIS — I70203 Unspecified atherosclerosis of native arteries of extremities, bilateral legs: Secondary | ICD-10-CM | POA: Diagnosis not present

## 2024-04-10 DIAGNOSIS — Z79899 Other long term (current) drug therapy: Secondary | ICD-10-CM | POA: Diagnosis not present

## 2024-04-10 DIAGNOSIS — Z800 Family history of malignant neoplasm of digestive organs: Secondary | ICD-10-CM | POA: Diagnosis not present

## 2024-04-10 DIAGNOSIS — R918 Other nonspecific abnormal finding of lung field: Secondary | ICD-10-CM | POA: Diagnosis not present

## 2024-04-10 DIAGNOSIS — I7123 Aneurysm of the descending thoracic aorta, without rupture: Secondary | ICD-10-CM | POA: Diagnosis not present

## 2024-04-10 DIAGNOSIS — J449 Chronic obstructive pulmonary disease, unspecified: Secondary | ICD-10-CM | POA: Diagnosis not present

## 2024-04-10 MED ORDER — IOHEXOL 350 MG/ML SOLN
75.0000 mL | Freq: Once | INTRAVENOUS | Status: AC | PRN
  Administered 2024-04-10: 100 mL via INTRAVENOUS

## 2024-04-10 NOTE — Progress Notes (Addendum)
 Patient ID: Martin Santiago, male   DOB: 1951-04-15, 73 y.o.   MRN: [id number]  Reason for Consult: New Patient (Initial Visit)   Referred by Ruthell Lauraine FALCON, NP  Subjective:     HPI:  Martin Santiago is a 73 y.o. male without significant peripheral vascular disease does have significant COPD with shortness of breath at baseline and is a current everyday smoker.  He denies any personal or family history of aneurysm disease.  The first he heard of an aneurysm was during his most recent CT scan for lung cancer screening.  Past Medical History:  Diagnosis Date   Colon polyps    COPD (chronic obstructive pulmonary disease) (HCC)    Crohn's disease (HCC)    Detached retina    Essential hypertension    Tropical sprue    Family History  Problem Relation Age of Onset   Colon cancer Maternal Aunt    Breast cancer Mother    Past Surgical History:  Procedure Laterality Date   BOWEL RESECTION  05/1994   Erik:  ? Crohn's   COLONOSCOPY     Dr Karena, [facility]:  Hx polyps. Cannot remmeber date   COLONOSCOPY N/A 12/03/2012   Procedure: COLONOSCOPY;  Surgeon: Lamar CHRISTELLA Hollingshead, MD;  Location: [facility];  Service: Endoscopy;  Laterality: N/A;  7:30   INGUINAL HERNIA REPAIR Right    RETINAL DETACHMENT SURGERY  07/2006   RETINAL DETACHMENT SURGERY  09/2006   RETINAL DETACHMENT SURGERY  01/2007    Short Social History:  Social History   Tobacco Use   Smoking status: Every Day    Current packs/day: 2.00    Average packs/day: 2.0 packs/day for 55.0 years (110.0 ttl pk-yrs)    Types: Cigarettes   Smokeless tobacco: Never   Tobacco comments:    Smoke 1/4 pack a day. Tay 04/19/22  Substance Use Topics   Alcohol use: Not Currently    Comment: Rare, 3-4 per yr    Allergies  Allergen Reactions   Codeine Nausea And Vomiting and Other (See Comments)    Other reaction(s): Flushing (ALLERGY/intolerance) fainting    Current Outpatient Medications  Medication Sig Dispense Refill    albuterol  (PROVENTIL ) (2.5 MG/3ML) 0.083% nebulizer solution Take 3 mLs (2.5 mg total) by nebulization every 4 (four) hours. 540 mL 11   albuterol  (VENTOLIN  HFA) 108 (90 Base) MCG/ACT inhaler Inhale 2 puffs into the lungs every 6 (six) hours as needed for wheezing or shortness of breath. 18 g 11   amLODipine  (NORVASC ) 10 MG tablet Take 1 tablet (10 mg total) by mouth daily. Needs appt 30 tablet 0   aspirin  EC 81 MG tablet Take 1 tablet (81 mg total) by mouth daily. Swallow whole.     atorvastatin  (LIPITOR) 20 MG tablet Take 1 tablet (20 mg total) by mouth daily. 90 tablet 3   azithromycin  (ZITHROMAX ) 250 MG tablet Take 1 tablet (250 mg total) by mouth daily. 30 tablet 5   BREZTRI  AEROSPHERE 160-9-4.8 MCG/ACT AERO INHALE 2 PUFFS INTO THE LUNGS IN THE MORNING AND AT BEDTIME 10.7 g 5   Ensifentrine  (OHTUVAYRE ) 3 MG/2.5ML SUSP Inhale 3 mg into the lungs in the morning and at bedtime. 450 mL 1   losartan  (COZAAR ) 100 MG tablet Take 1 tablet (100 mg total) by mouth daily. Needs appt 30 tablet 0   omeprazole  (PRILOSEC) 20 MG capsule TAKE 1 CAPSULE(20 MG) BY MOUTH DAILY 90 capsule 2   sildenafil  (REVATIO ) 20 MG tablet 3-5  tab prior to sex.(Max use once in 24 hour period) 50 tablet 0   timolol (TIMOPTIC) 0.5 % ophthalmic solution Place 1 drop into both eyes daily.     No current facility-administered medications for this visit.    Review of Systems  Constitutional:  Constitutional negative. HENT: HENT negative.  Eyes: Eyes negative.  Respiratory: Positive for shortness of breath.  Cardiovascular: Cardiovascular negative.  GI: Gastrointestinal negative.  Musculoskeletal: Musculoskeletal negative.  Skin: Skin negative.  Neurological: Neurological negative. Hematologic: Hematologic/lymphatic negative.  Psychiatric: Psychiatric negative.        Objective:  Objective   Vitals:   04/10/24 1142  BP: 132/88  Pulse: 73  Temp: 98 F (36.7 C)  SpO2: (!) 84%  Weight: 132 lb (59.9 kg)  Height: 5'  6 (1.676 m)   Body mass index is 21.31 kg/m.  Physical Exam HENT:     Head: Normocephalic.     Nose: Nose normal.  Eyes:     Pupils: Pupils are equal, round, and reactive to light.  Cardiovascular:     Pulses:          Femoral pulses are 1+ on the right side and 1+ on the left side. Pulmonary:     Breath sounds: Wheezing present.  Abdominal:     General: Abdomen is flat.     Palpations: Abdomen is soft. There is mass.  Musculoskeletal:     Right lower leg: No edema.     Left lower leg: No edema.  Skin:    General: Skin is warm.     Capillary Refill: Capillary refill takes less than 2 seconds.  Neurological:     General: No focal deficit present.     Mental Status: He is alert.  Psychiatric:        Mood and Affect: Mood normal.        Thought Content: Thought content normal.        Judgment: Judgment normal.     Data: Abdominal Aorta Findings:  +-----------+-------+----------+----------+----------+--------+--------+  Location  AP (cm)Trans (cm)PSV (cm/s)Waveform  ThrombusComments  +-----------+-------+----------+----------+----------+--------+--------+  Proximal  3.60   3.45      64                                    +-----------+-------+----------+----------+----------+--------+--------+  Mid       6.80   6.15      52                                    +-----------+-------+----------+----------+----------+--------+--------+  Distal    9.13   11.20     19                                    +-----------+-------+----------+----------+----------+--------+--------+  RT CIA Prox                 162       monophasic                  +-----------+-------+----------+----------+----------+--------+--------+  LT CIA Prox2.6    2.3       42                                    +-----------+-------+----------+----------+----------+--------+--------+  Visualization of the Right CIA Proximal artery was limited.   IVC/Iliac  Findings:  +--------+------+--------+--------+   IVC   PatentThrombusComments  +--------+------+--------+--------+  IVC Proxpatent                  +--------+------+--------+--------+    Summary:  Abdominal Aorta: There is evidence of abnormal dilatation of the distal,  proximal and mid Abdominal aorta. The largest aortic diameter measurement  is 11.2 cm, with an aneurysm length of approximately 13.8cm.        CT IMPRESSION: 1. Large 9.8 cm infrarenal abdominal aortic aneurysm. Recommend referral to vascular specialist. 2. Unchanged size of 4.0 cm descending thoracic aortic aneurysm. 3. 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 4. Limited visualization of the bilateral iliac arteries and proximal femoral arteries due to contrast bolus timing. Likely mild-to-moderate stenoses throughout the tortuous bilateral common and external iliac arteries and at least moderate stenosis of the bilateral common femoral arteries. 5. Interval resolution of previously identified peripheral nodular opacities in the bilateral lungs.   Assessment/Plan:     73 year old with very large abdominal aortic aneurysm.  I discussed with the patient that he would be a candidate for endovascular repair but would likely need revascularization of his bilateral common femoral arteries.  He is very high risk for any general anesthetic given underlying cardiopulmonary disease.  We discussed that he is at very high risk of rupture from his aneurysm at its current size.  I have offered to fix him this week  including with admission to the hospital today if necessary.  Patient at this time is unsure if he wants to  undergo major surgery due to the risks.  He is with his son today and they demonstrate very good understanding of the high risk nature of his aneurysm as well as surgery.  All questions were answered.  I will plan to call him later today to  further delineate a plan moving forward.     Penne Lonni Colorado MD [facility]  Addendum: I called and discussed with patient and with again reviewed possibility of endovascular repair but the high risk nature of this from a cardiopulmonary standpoint and also his access of bilateral common femoral arteries and bilateral common and external iliac artery calcification.  He demonstrates good understanding and after talking with his family he wishes to proceed.  We discussed the signs and symptoms of rupture for which she would need emergent medical attention in short and that we will plan for repair in 2 days from now.  All questions were answered he demonstrates good understanding.  [name], MD

## 2024-04-10 NOTE — Telephone Encounter (Signed)
 Call report taken-pt CTA results are available and MD is aware.

## 2024-04-10 NOTE — Telephone Encounter (Signed)
 Spoke with pt regarding his LDCT. While on the phone pt he states he has not received the Ohtuvayre . He states he called over to [facility] and they informed him our office needs to approve a tubing/ attachment prior to sending the medication to him. Please advise as he has not received this medication yet.

## 2024-04-10 NOTE — H&P (View-Only) (Signed)
 Patient ID: Martin Santiago, male   DOB: 1951-04-15, 73 y.o.   MRN: [id number]  Reason for Consult: New Patient (Initial Visit)   Referred by Ruthell Lauraine FALCON, NP  Subjective:     HPI:  Martin Santiago is a 73 y.o. male without significant peripheral vascular disease does have significant COPD with shortness of breath at baseline and is a current everyday smoker.  He denies any personal or family history of aneurysm disease.  The first he heard of an aneurysm was during his most recent CT scan for lung cancer screening.  Past Medical History:  Diagnosis Date   Colon polyps    COPD (chronic obstructive pulmonary disease) (HCC)    Crohn's disease (HCC)    Detached retina    Essential hypertension    Tropical sprue    Family History  Problem Relation Age of Onset   Colon cancer Maternal Aunt    Breast cancer Mother    Past Surgical History:  Procedure Laterality Date   BOWEL RESECTION  05/1994   Erik:  ? Crohn's   COLONOSCOPY     Dr Karena, [facility]:  Hx polyps. Cannot remmeber date   COLONOSCOPY N/A 12/03/2012   Procedure: COLONOSCOPY;  Surgeon: Lamar CHRISTELLA Hollingshead, MD;  Location: [facility];  Service: Endoscopy;  Laterality: N/A;  7:30   INGUINAL HERNIA REPAIR Right    RETINAL DETACHMENT SURGERY  07/2006   RETINAL DETACHMENT SURGERY  09/2006   RETINAL DETACHMENT SURGERY  01/2007    Short Social History:  Social History   Tobacco Use   Smoking status: Every Day    Current packs/day: 2.00    Average packs/day: 2.0 packs/day for 55.0 years (110.0 ttl pk-yrs)    Types: Cigarettes   Smokeless tobacco: Never   Tobacco comments:    Smoke 1/4 pack a day. Tay 04/19/22  Substance Use Topics   Alcohol use: Not Currently    Comment: Rare, 3-4 per yr    Allergies  Allergen Reactions   Codeine Nausea And Vomiting and Other (See Comments)    Other reaction(s): Flushing (ALLERGY/intolerance) fainting    Current Outpatient Medications  Medication Sig Dispense Refill    albuterol  (PROVENTIL ) (2.5 MG/3ML) 0.083% nebulizer solution Take 3 mLs (2.5 mg total) by nebulization every 4 (four) hours. 540 mL 11   albuterol  (VENTOLIN  HFA) 108 (90 Base) MCG/ACT inhaler Inhale 2 puffs into the lungs every 6 (six) hours as needed for wheezing or shortness of breath. 18 g 11   amLODipine  (NORVASC ) 10 MG tablet Take 1 tablet (10 mg total) by mouth daily. Needs appt 30 tablet 0   aspirin  EC 81 MG tablet Take 1 tablet (81 mg total) by mouth daily. Swallow whole.     atorvastatin  (LIPITOR) 20 MG tablet Take 1 tablet (20 mg total) by mouth daily. 90 tablet 3   azithromycin  (ZITHROMAX ) 250 MG tablet Take 1 tablet (250 mg total) by mouth daily. 30 tablet 5   BREZTRI  AEROSPHERE 160-9-4.8 MCG/ACT AERO INHALE 2 PUFFS INTO THE LUNGS IN THE MORNING AND AT BEDTIME 10.7 g 5   Ensifentrine  (OHTUVAYRE ) 3 MG/2.5ML SUSP Inhale 3 mg into the lungs in the morning and at bedtime. 450 mL 1   losartan  (COZAAR ) 100 MG tablet Take 1 tablet (100 mg total) by mouth daily. Needs appt 30 tablet 0   omeprazole  (PRILOSEC) 20 MG capsule TAKE 1 CAPSULE(20 MG) BY MOUTH DAILY 90 capsule 2   sildenafil  (REVATIO ) 20 MG tablet 3-5  tab prior to sex.(Max use once in 24 hour period) 50 tablet 0   timolol (TIMOPTIC) 0.5 % ophthalmic solution Place 1 drop into both eyes daily.     No current facility-administered medications for this visit.    Review of Systems  Constitutional:  Constitutional negative. HENT: HENT negative.  Eyes: Eyes negative.  Respiratory: Positive for shortness of breath.  Cardiovascular: Cardiovascular negative.  GI: Gastrointestinal negative.  Musculoskeletal: Musculoskeletal negative.  Skin: Skin negative.  Neurological: Neurological negative. Hematologic: Hematologic/lymphatic negative.  Psychiatric: Psychiatric negative.        Objective:  Objective   Vitals:   04/10/24 1142  BP: 132/88  Pulse: 73  Temp: 98 F (36.7 C)  SpO2: (!) 84%  Weight: 132 lb (59.9 kg)  Height: 5'  6 (1.676 m)   Body mass index is 21.31 kg/m.  Physical Exam HENT:     Head: Normocephalic.     Nose: Nose normal.  Eyes:     Pupils: Pupils are equal, round, and reactive to light.  Cardiovascular:     Pulses:          Femoral pulses are 1+ on the right side and 1+ on the left side. Pulmonary:     Breath sounds: Wheezing present.  Abdominal:     General: Abdomen is flat.     Palpations: Abdomen is soft. There is mass.  Musculoskeletal:     Right lower leg: No edema.     Left lower leg: No edema.  Skin:    General: Skin is warm.     Capillary Refill: Capillary refill takes less than 2 seconds.  Neurological:     General: No focal deficit present.     Mental Status: He is alert.  Psychiatric:        Mood and Affect: Mood normal.        Thought Content: Thought content normal.        Judgment: Judgment normal.     Data: Abdominal Aorta Findings:  +-----------+-------+----------+----------+----------+--------+--------+  Location  AP (cm)Trans (cm)PSV (cm/s)Waveform  ThrombusComments  +-----------+-------+----------+----------+----------+--------+--------+  Proximal  3.60   3.45      64                                    +-----------+-------+----------+----------+----------+--------+--------+  Mid       6.80   6.15      52                                    +-----------+-------+----------+----------+----------+--------+--------+  Distal    9.13   11.20     19                                    +-----------+-------+----------+----------+----------+--------+--------+  RT CIA Prox                 162       monophasic                  +-----------+-------+----------+----------+----------+--------+--------+  LT CIA Prox2.6    2.3       42                                    +-----------+-------+----------+----------+----------+--------+--------+  Visualization of the Right CIA Proximal artery was limited.   IVC/Iliac  Findings:  +--------+------+--------+--------+   IVC   PatentThrombusComments  +--------+------+--------+--------+  IVC Proxpatent                  +--------+------+--------+--------+    Summary:  Abdominal Aorta: There is evidence of abnormal dilatation of the distal,  proximal and mid Abdominal aorta. The largest aortic diameter measurement  is 11.2 cm, with an aneurysm length of approximately 13.8cm.        CT IMPRESSION: 1. Large 9.8 cm infrarenal abdominal aortic aneurysm. Recommend referral to vascular specialist. 2. Unchanged size of 4.0 cm descending thoracic aortic aneurysm. 3. 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 4. Limited visualization of the bilateral iliac arteries and proximal femoral arteries due to contrast bolus timing. Likely mild-to-moderate stenoses throughout the tortuous bilateral common and external iliac arteries and at least moderate stenosis of the bilateral common femoral arteries. 5. Interval resolution of previously identified peripheral nodular opacities in the bilateral lungs.   Assessment/Plan:     73 year old with very large abdominal aortic aneurysm.  I discussed with the patient that he would be a candidate for endovascular repair but would likely need revascularization of his bilateral common femoral arteries.  He is very high risk for any general anesthetic given underlying cardiopulmonary disease.  We discussed that he is at very high risk of rupture from his aneurysm at its current size.  I have offered to fix him this week  including with admission to the hospital today if necessary.  Patient at this time is unsure if he wants to  undergo major surgery due to the risks.  He is with his son today and they demonstrate very good understanding of the high risk nature of his aneurysm as well as surgery.  All questions were answered.  I will plan to call him later today to  further delineate a plan moving forward.     Penne Lonni Colorado MD [facility]  Addendum: I called and discussed with patient and with again reviewed possibility of endovascular repair but the high risk nature of this from a cardiopulmonary standpoint and also his access of bilateral common femoral arteries and bilateral common and external iliac artery calcification.  He demonstrates good understanding and after talking with his family he wishes to proceed.  We discussed the signs and symptoms of rupture for which she would need emergent medical attention in short and that we will plan for repair in 2 days from now.  All questions were answered he demonstrates good understanding.  [name], MD

## 2024-04-10 NOTE — Telephone Encounter (Signed)
 Called and spoke to pt. Informed him of the results of the LDCT regarding the new lung nodule and recs for 6 month follow up scan. Patient is agreeable to this, order placed. I also spoke with pt regarding the 9.4cm AAA. Patient has already been contacted regarding follow up on this by vascular.

## 2024-04-11 ENCOUNTER — Encounter (HOSPITAL_COMMUNITY): Payer: Self-pay | Admitting: Vascular Surgery

## 2024-04-11 ENCOUNTER — Other Ambulatory Visit: Payer: Self-pay

## 2024-04-11 NOTE — Progress Notes (Signed)
 SDW call  Patient was given pre-op instructions over the phone. Patient verbalized understanding of instructions provided.     PCP - Dallas Maxwell, PA-C Cardiologist - Dr. Lamar Fitch Pulmonary: Dr. Dorn Chill and Lauraine Lites NP.  He is currently taking Azithromycin  prescribed by Dr. Chill   PPM/ICD - denies Device Orders - na Rep Notified - na   Chest x-ray - chest CT: 04/10/2024 EKG -  05/12/2023 Stress Test -  ECHO - 06/15/2023 Cardiac Cath -  PFT: 04/19/2022  Sleep Study/sleep apnea/CPAP: denies  Non-diabetic  Blood Thinner Instructions: denies Aspirin  Instructions:continue   ERAS Protcol - NPO   Anesthesia review: Yes. HTN, HLD, COPD, AAA repair   Patient denies shortness of breath, fever and chest pain over the phone call. Patient has a constant cough  Your procedure is scheduled on Friday April 12, 2024  Report to [facility] Main Entrance A at  1230 PM, then check in with the Admitting office.  Call this number if you have problems the morning of surgery:  [phone]   If you have any questions prior to your surgery date call [phone]: Open Monday-Friday 8am-4pm If you experience any cold or flu symptoms such as cough, fever, chills, shortness of breath, etc. between now and your scheduled surgery, please notify us  at the above number    Remember:  Do not eat or drink after midnight the night before your surgery  Take these medicines the morning of surgery with A SIP OF WATER :  Amlodipine , atorvastatin , ASA, azithromycin , breztiri, omeprazole , flomax, eye drops  As needed: Albuterol  neb/inhaler  As of today, STOP taking any Aleve, Naproxen, Ibuprofen, Motrin, Advil, Goody's, BC's, all herbal medications, fish oil, and all vitamins.

## 2024-04-11 NOTE — Progress Notes (Signed)
 Anesthesia Chart Review: SAME DAY WORK-UP  Case: [id number] Date/Time: 04/12/24 1220   Procedure: INSERTION, ENDOVASCULAR STENT GRAFT, AORTA, ABDOMINAL   Anesthesia type: General   Diagnosis: Infrarenal abdominal aortic aneurysm (AAA) without rupture (HCC) [I71.43]   Pre-op diagnosis: AAA W/O RUPTURE   Location: [facility] ROOM 16 / [facility]   Surgeons: Sheree Penne Bruckner, MD       DISCUSSION: Patient is a 73 year old male scheduled for the above procedure. June 2025 chest CT LCS showed a partially visualized AAA measuring at least 9.4 cm.  He was referred to vascular surgery and seen on 04/10/24 after undergoing a CTA showing a 9.8 cm AAA (measured even larger on US ), stable 4.0 cm descending TAA. Dr. Sheree wrote, I discussed with the patient that he would be a candidate for endovascular repair but would likely need revascularization of his bilateral common femoral arteries. He is very high risk for any general anesthetic given underlying cardiopulmonary disease. We discussed that he is at very high risk of rupture from his aneurysm at its current size. I have offered to fix him this week including with admission to the hospital today if necessary....  History includes smoking, COPD, HTN, AAA, Crohn's disease, bowel resection (1995), right inguinal hernia repair (2008), retinal detachment (s/p surgery 2007, 2008, 2022).  He has known severe COPD and uses O2 during the day and night. By Februrary 2025 ONO, he spent 5 hr 24 min with SpO2 less than 88%. Recommend using 3L of oxygen  with sleep.  He was last evaluated by pulmonologist Dr. Kara was on 03/12/24. He was prescribed prednisone  taper and azithromycin  for COPD exacerbation with increased cough, wheezing, and postnasal drip. No fever or systemic symptoms. Continue Breztri . Paperwork completed to start Ohtuvayre  nebulizer treatments BID. Encouraged complete smoking cessation (still smoking 5-6 cigs/day) and continue supplemental home O2 (has home  O2 & portable O2 concentrator). Known severe obstruction, air trapping with severe diffusion defect by July 2023 PFTs.    He was referred to cardiologist Dr. Bernie in 2024 for severe coronary calcifications on chest imaging. He had previously had evaluation for the same reason in June 2017 with Dr. Jayson Sierras, and he had a non-ischemic stress test. Primary symptom was DOE. Did not described any worrisome chest pains, but activity significantly limited due to his respiratory status (uses motorized wheelchair). Echo and carotid US  initially ordered at August 2024 visit. 05/18/23 carotid US  showed 1-39% BICA stenosis. 06/15/23 TTE showed LVEF 60-65%, no RWMA, grade 1 DD, normal RV systolic function, mild MR. At 08/02/23 follow-up, no real change in symptoms, but given his poor functional status discussed a future Lexiscan  Myoview , but appears this was never done.   Seen by neurosurgeon Dr. Louis, last 06/29/23 for follow-up C1 fracture diagnosed 05/28/23. He was prescribed c-collar and Decadron  initially. Appears to have been managed conservatively. At follow-up, he was doing overall well with minimal neck pain, no radicular pain, but with known chronic paresthesias in both hands that had been present for years. He denied loss of strength or dexterity.  He is currently residing at [facility].   Late add for EVAR of very large AAA. Anesthesia team to evaluate on the day of surgery.    VS: Ht 5' 6 (1.676 m)   Wt 59.9 kg   BMI 21.31 kg/m  BP Readings from Last 3 Encounters:  04/10/24 132/88  03/12/24 122/82  10/19/23 130/82   Pulse Readings from Last 3 Encounters:  04/10/24 73  03/12/24 91  10/19/23 88    PROVIDERS: Dorina Loving, PA-C is PCP  Bernie Charleston, MD is cardiologist Kara Carrier, MD is pulmonologist Sheree Riis, MD is vascular surgeon Louis Shove, MD is neurosurgeon   LABS: For day of surgery as indicated. Most recent results in  [facility] include: Lab Results  Component Value Date   WBC 5.8 04/06/2023   HGB 14.0 04/06/2023   HCT 43.4 04/06/2023   PLT 255.0 04/06/2023   GLUCOSE 102 (H) 04/06/2023   CHOL 142 01/10/2024   TRIG 117 01/10/2024   HDL 51 01/10/2024   LDLCALC 70 01/10/2024   ALT 6 01/10/2024   AST 14 01/10/2024   NA 139 04/06/2023   K 4.5 04/06/2023   CL 104 04/06/2023   CREATININE 0.95 04/06/2023   BUN 13 04/06/2023   CO2 29 04/06/2023     PFTs 04/19/22: FVC 2.58 (69%), post 2.74 (74%). FEV1 1.01 (37%), post 1.07 (39%). DLCO 7.75 (34%). severe obstruction, air trapping and severe diffusion defect.    IMAGES: CTA Chest/abd/pelvis 04/10/24: IMPRESSION: 1. Large 9.8 cm infrarenal abdominal aortic aneurysm. Recommend referral to vascular specialist. 2. Unchanged size of 4.0 cm descending thoracic aortic aneurysm. 3. 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 4. Limited visualization of the bilateral iliac arteries and proximal femoral arteries due to contrast bolus timing. Likely mild-to-moderate stenoses throughout the tortuous bilateral common and external iliac arteries and at least moderate stenosis of the bilateral common femoral arteries. 5. Interval resolution of previously identified peripheral nodular opacities in the bilateral lungs.   US  AAA 04/10/24: Summary:  Abdominal Aorta: There is evidence of abnormal dilatation of the distal,  proximal and mid Abdominal aorta. The largest aortic diameter measurement  is 11.2 cm, with an aneurysm length of approximately 13.8cm.   CT Chest LCS 03/13/24: IMPRESSION: 1. Partially visualized large infrarenal abdominal aortic aneurysm measuring at least 9.4 cm diameter. Recommend dedicated CT angiogram of the abdomen and pelvis at this time and referral to or continued care with vascular specialist. (Ref.: J Vasc Surg. 2018; 67:2-77 and J Am Coll Radiol 2013;10(10):789-794.) 2.  Lung-RADS 3, probably benign findings. Short-term follow-up in 6 months is recommended with repeat low-dose chest CT without contrast (please use the following order, CT CHEST LCS NODULE FOLLOW-UP W/O CM). Several new indistinct patchy clustered pulmonary nodules throughout the periphery of both lungs, largest 5.9 mm in the peripheral left upper lobe, favor infectious or inflammatory etiology. 3. Three-vessel coronary atherosclerosis. 4. Aortic Atherosclerosis (ICD10-I70.0) and Emphysema (ICD10-J43.9).  CT C-spine 05/28/23: IMPRESSION: - Acute fracture of the left anterior arch of C1 with 3 mm of widening. - Question small epidural hematoma versus pannus formation posterior to the dens. No severe spinal canal narrowing. - Prevertebral soft tissue swelling anterior to the dens. - Multilevel advanced spondylosis.   EKG: 05/12/23: Sinus rhythm with sinus arrhythmia with occasional Premature ventricular complexes Left axis deviation When compared with ECG of 13-Jun-2017 10:35, Premature ventricular complexes are now Present QRS axis Shifted left Criteria for Septal infarct are no longer Present Confirmed by Bernie Charleston [phone]) on 05/12/2023 3:11:42 PM   CV: Echo 06/15/23: IMPRESSIONS   1. Left ventricular ejection fraction, by estimation, is 60 to 65%. The  left ventricle has normal function. The left ventricle has no regional  wall motion abnormalities. Left ventricular diastolic parameters are  consistent with Grade I diastolic  dysfunction (impaired relaxation).   2. Right ventricular systolic function is normal. The right  ventricular  size is normal.   3. The mitral valve is normal in structure. Mild mitral valve  regurgitation. No evidence of mitral stenosis.   4. The aortic valve was not well visualized. Aortic valve regurgitation  is trivial. Aortic valve sclerosis is present, with no evidence of aortic  valve stenosis.   5. The inferior vena cava is normal in size  with greater than 50%  respiratory variability, suggesting right atrial pressure of 3 mmHg.  - Comparison(s): Nuclear stress test done 03/07/16 showed an EF of 55%.    US  Carotid 05/18/23: Summary:  - Right Carotid: Velocities in the right ICA are consistent with a 1-39% stenosis.  - Left Carotid: Velocities in the left ICA are consistent with a 1-39% stenosis.  - Vertebrals: Bilateral vertebral arteries demonstrate antegrade flow.  - Subclavians: Normal flow hemodynamics were seen in bilateral subclavian arteries.    Nuclear stress test 03/07/16: No diagnostic ST segment changes to suggest ischemia. Occasional PACs and PVCs noted. Small, mild intensity, apical inferolateral defect that is fixed, more prominently noted on the rest imaging, and consistent with soft tissue attenuation. No definite ischemia. This is a low risk study. Nuclear stress EF: 55%.   Past Medical History:  Diagnosis Date   AAA (abdominal aortic aneurysm) (HCC)    9.8 cm 04/10/24 CTA   Closed C1 fracture (HCC) 05/28/2023   Colon polyps    COPD (chronic obstructive pulmonary disease) (HCC)    Crohn's disease (HCC)    Detached retina    Essential hypertension    Tropical sprue     Past Surgical History:  Procedure Laterality Date   BOWEL RESECTION  05/27/1994   [place]:  ? Crohn's   COLONOSCOPY     Dr Karena, [facility]:  Hx polyps. Cannot remmeber date   COLONOSCOPY N/A 12/03/2012   Procedure: COLONOSCOPY;  Surgeon: Lamar CHRISTELLA Hollingshead, MD;  Location: [facility];  Service: Endoscopy;  Laterality: N/A;  7:30   INGUINAL HERNIA REPAIR Right    RETINAL DETACHMENT SURGERY  07/27/2006   RETINAL DETACHMENT SURGERY  09/26/2006   RETINAL DETACHMENT SURGERY  01/25/2007   TONSILLECTOMY      MEDICATIONS: No current facility-administered medications for this encounter.    albuterol  (PROVENTIL ) (2.5 MG/3ML) 0.083% nebulizer solution   albuterol  (VENTOLIN  HFA) 108 (90 Base) MCG/ACT inhaler   amLODipine  (NORVASC ) 10  MG tablet   aspirin  EC 81 MG tablet   atorvastatin  (LIPITOR) 20 MG tablet   azithromycin  (ZITHROMAX ) 250 MG tablet   BREZTRI  AEROSPHERE 160-9-4.8 MCG/ACT AERO   losartan  (COZAAR ) 100 MG tablet   nicotine  (NICODERM CQ  - DOSED IN MG/24 HOURS) 21 mg/24hr patch   omeprazole  (PRILOSEC) 20 MG capsule   sildenafil  (REVATIO ) 20 MG tablet   tamsulosin (FLOMAX) 0.4 MG CAPS capsule   timolol (TIMOPTIC) 0.5 % ophthalmic solution   Ensifentrine  (OHTUVAYRE ) 3 MG/2.5ML SUSP    Isaiah Ruder, PA-C Surgical Short Stay/Anesthesiology [facility] Phone [phone] [facility] Phone [phone] 04/11/2024 3:24 PM

## 2024-04-11 NOTE — Progress Notes (Signed)
 Called and informed patient to arrive at 1000 for the 1235 surgery time.

## 2024-04-11 NOTE — Anesthesia Preprocedure Evaluation (Addendum)
 Anesthesia Evaluation  Patient identified by MRN, date of birth, ID band Patient awake    Reviewed: Allergy & Precautions, NPO status , Patient's Chart, lab work & pertinent test results  History of Anesthesia Complications Negative for: history of anesthetic complications  Airway Mallampati: I  TM Distance: >3 FB Neck ROM: Full    Dental  (+) Edentulous Upper, Edentulous Lower   Pulmonary COPD,  COPD inhaler and oxygen  dependent, Current Smoker and Patient abstained from smoking.   breath sounds clear to auscultation       Cardiovascular hypertension, Pt. on medications (-) angina + CAD and + Peripheral Vascular Disease (AAA)   Rhythm:Regular Rate:Normal  Echo 06/15/23: IMPRESSIONS   1. Left ventricular EF 60 to 65%. The LV has normal function, no regional wall motion abnormalities. Grade I diastolic dysfunction (impaired relaxation).   2. Right ventricular systolic function is normal. The right ventricular  size is normal.   3. The mitral valve is normal in structure. Mild mitral valve  regurgitation. No evidence of mitral stenosis.   4. The aortic valve was not well visualized. Aortic valve regurgitation is trivial. Aortic valve sclerosis is present, with no evidence of aortic valve stenosis.   5. The inferior vena cava is normal in size with greater than 50%  respiratory variability, suggesting right atrial pressure of 3 mmHg.  - Comparison(s): Nuclear stress test done 03/07/16 showed an EF of 55%.     Neuro/Psych glaucoma    GI/Hepatic Neg liver ROS,GERD  Medicated and Controlled,,Crohn's   Endo/Other  negative endocrine ROS    Renal/GU negative Renal ROS     Musculoskeletal negative musculoskeletal ROS (+)    Abdominal   Peds  Hematology negative hematology ROS (+)   Anesthesia Other Findings   Reproductive/Obstetrics                              Anesthesia Physical Anesthesia  Plan  ASA: 3  Anesthesia Plan: General   Post-op Pain Management: Tylenol  PO (pre-op)* and Minimal or no pain anticipated   Induction: Intravenous  PONV Risk Score and Plan: 1 and Ondansetron   Airway Management Planned: Oral ETT  Additional Equipment: Arterial line  Intra-op Plan:   Post-operative Plan: Extubation in OR  Informed Consent: I have reviewed the patients History and Physical, chart, labs and discussed the procedure including the risks, benefits and alternatives for the proposed anesthesia with the patient or authorized representative who has indicated his/her understanding and acceptance.     Dental advisory given  Plan Discussed with: CRNA and Surgeon  Anesthesia Plan Comments: (PAT note written 04/11/2024 by Isaiah Ruder, PA-C.  CTA Chest/abd/pelvis 04/10/24: IMPRESSION: 1. Large 9.8 cm infrarenal abdominal aortic aneurysm. Recommend referral to vascular specialist. 2. Unchanged size of 4.0 cm descending thoracic aortic aneurysm. 3. 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 4. Limited visualization of the bilateral iliac arteries and proximal femoral arteries due to contrast bolus timing. Likely mild-to-moderate stenoses throughout the tortuous bilateral common and external iliac arteries and at least moderate stenosis of the bilateral common femoral arteries. 5. Interval resolution of previously identified peripheral nodular opacities in the bilateral lungs.   US  AAA 04/10/24: Summary:  Abdominal Aorta: There is evidence of abnormal dilatation of the distal,  proximal and mid Abdominal aorta. The largest aortic diameter measurement  is 11.2 cm, with an aneurysm length of approximately 13.8cm.  CT C-spine 05/28/23: IMPRESSION: - Acute fracture of the left anterior arch of C1 with 3 mm of widening. - Question small epidural hematoma versus pannus formation posterior to the dens. No  severe spinal canal narrowing. - Prevertebral soft tissue swelling anterior to the dens. - Multilevel advanced spondylosis.     )         Anesthesia Quick Evaluation

## 2024-04-12 ENCOUNTER — Inpatient Hospital Stay (HOSPITAL_COMMUNITY)

## 2024-04-12 ENCOUNTER — Inpatient Hospital Stay (HOSPITAL_COMMUNITY): Payer: Self-pay | Admitting: Vascular Surgery

## 2024-04-12 ENCOUNTER — Other Ambulatory Visit: Payer: Self-pay

## 2024-04-12 ENCOUNTER — Encounter (HOSPITAL_COMMUNITY): Payer: Self-pay | Admitting: Vascular Surgery

## 2024-04-12 ENCOUNTER — Inpatient Hospital Stay (HOSPITAL_COMMUNITY)
Admission: RE | Admit: 2024-04-12 | Discharge: 2024-04-14 | DRG: 268 | Disposition: A | Discharge status: Home / Self Care | Attending: Vascular Surgery | Admitting: Vascular Surgery

## 2024-04-12 ENCOUNTER — Encounter (HOSPITAL_COMMUNITY)
Admission: RE | Disposition: A | Discharge status: Home / Self Care | Payer: Self-pay | Source: Home / Self Care | Attending: Vascular Surgery

## 2024-04-12 DIAGNOSIS — I714 Abdominal aortic aneurysm, without rupture, unspecified: Secondary | ICD-10-CM

## 2024-04-12 DIAGNOSIS — Z7951 Long term (current) use of inhaled steroids: Secondary | ICD-10-CM

## 2024-04-12 DIAGNOSIS — J449 Chronic obstructive pulmonary disease, unspecified: Secondary | ICD-10-CM

## 2024-04-12 DIAGNOSIS — I771 Stricture of artery: Secondary | ICD-10-CM

## 2024-04-12 DIAGNOSIS — R202 Paresthesia of skin: Secondary | ICD-10-CM | POA: Diagnosis present

## 2024-04-12 DIAGNOSIS — Z803 Family history of malignant neoplasm of breast: Secondary | ICD-10-CM

## 2024-04-12 DIAGNOSIS — K219 Gastro-esophageal reflux disease without esophagitis: Secondary | ICD-10-CM | POA: Diagnosis present

## 2024-04-12 DIAGNOSIS — K5090 Crohn's disease, unspecified, without complications: Secondary | ICD-10-CM | POA: Diagnosis present

## 2024-04-12 DIAGNOSIS — Z79899 Other long term (current) drug therapy: Secondary | ICD-10-CM | POA: Diagnosis not present

## 2024-04-12 DIAGNOSIS — I2510 Atherosclerotic heart disease of native coronary artery without angina pectoris: Secondary | ICD-10-CM | POA: Diagnosis present

## 2024-04-12 DIAGNOSIS — F17210 Nicotine dependence, cigarettes, uncomplicated: Secondary | ICD-10-CM

## 2024-04-12 DIAGNOSIS — I70203 Unspecified atherosclerosis of native arteries of extremities, bilateral legs: Secondary | ICD-10-CM | POA: Diagnosis present

## 2024-04-12 DIAGNOSIS — I7123 Aneurysm of the descending thoracic aorta, without rupture: Secondary | ICD-10-CM | POA: Diagnosis present

## 2024-04-12 DIAGNOSIS — I7143 Infrarenal abdominal aortic aneurysm, without rupture: Principal | ICD-10-CM | POA: Diagnosis present

## 2024-04-12 DIAGNOSIS — K683 Retroperitoneal hematoma: Secondary | ICD-10-CM | POA: Diagnosis not present

## 2024-04-12 DIAGNOSIS — Z9889 Other specified postprocedural states: Principal | ICD-10-CM

## 2024-04-12 DIAGNOSIS — I959 Hypotension, unspecified: Secondary | ICD-10-CM | POA: Diagnosis not present

## 2024-04-12 DIAGNOSIS — Z8679 Personal history of other diseases of the circulatory system: Principal | ICD-10-CM

## 2024-04-12 DIAGNOSIS — Z860100 Personal history of colon polyps, unspecified: Secondary | ICD-10-CM

## 2024-04-12 DIAGNOSIS — H409 Unspecified glaucoma: Secondary | ICD-10-CM | POA: Diagnosis present

## 2024-04-12 DIAGNOSIS — I878 Other specified disorders of veins: Secondary | ICD-10-CM

## 2024-04-12 DIAGNOSIS — I10 Essential (primary) hypertension: Secondary | ICD-10-CM | POA: Diagnosis present

## 2024-04-12 DIAGNOSIS — Z800 Family history of malignant neoplasm of digestive organs: Secondary | ICD-10-CM | POA: Diagnosis not present

## 2024-04-12 DIAGNOSIS — Z885 Allergy status to narcotic agent status: Secondary | ICD-10-CM | POA: Diagnosis not present

## 2024-04-12 HISTORY — PX: REPAIR ILIAC ARTERY: SHX6216

## 2024-04-12 HISTORY — PX: ABDOMINAL AORTIC ENDOVASCULAR STENT GRAFT: SHX5707

## 2024-04-12 HISTORY — PX: ULTRASOUND GUIDANCE FOR VASCULAR ACCESS: SHX6516

## 2024-04-12 HISTORY — DX: Abdominal aortic aneurysm, without rupture, unspecified: I71.40

## 2024-04-12 LAB — POCT I-STAT 7, (LYTES, BLD GAS, ICA,H+H)
Acid-Base Excess: 2 mmol/L (ref 0.0–2.0)
Acid-base deficit: 1 mmol/L (ref 0.0–2.0)
Bicarbonate: 27.8 mmol/L (ref 20.0–28.0)
Bicarbonate: 25.7 mmol/L (ref 20.0–28.0)
Calcium, Ion: 1.33 mmol/L (ref 1.15–1.40)
Calcium, Ion: 1.3 mmol/L (ref 1.15–1.40)
HCT: 33 % — ABNORMAL LOW (ref 39.0–52.0)
HCT: 28 % — ABNORMAL LOW (ref 39.0–52.0)
Hemoglobin: 11.2 g/dL — ABNORMAL LOW (ref 13.0–17.0)
Hemoglobin: 9.5 g/dL — ABNORMAL LOW (ref 13.0–17.0)
O2 Saturation: 99 %
O2 Saturation: 98 %
Patient temperature: 36.5
Patient temperature: 37.2
Potassium: 3.9 mmol/L (ref 3.5–5.1)
Potassium: 4.2 mmol/L (ref 3.5–5.1)
Sodium: 141 mmol/L (ref 135–145)
Sodium: 140 mmol/L (ref 135–145)
TCO2: 29 mmol/L (ref 22–32)
TCO2: 27 mmol/L (ref 22–32)
pCO2 arterial: 48.2 mmHg — ABNORMAL HIGH (ref 32–48)
pCO2 arterial: 50.6 mmHg — ABNORMAL HIGH (ref 32–48)
pH, Arterial: 7.367 (ref 7.35–7.45)
pH, Arterial: 7.315 — ABNORMAL LOW (ref 7.35–7.45)
pO2, Arterial: 142 mmHg — ABNORMAL HIGH (ref 83–108)
pO2, Arterial: 109 mmHg — ABNORMAL HIGH (ref 83–108)

## 2024-04-12 LAB — SURGICAL PCR SCREEN
MRSA, PCR: NEGATIVE
Staphylococcus aureus: NEGATIVE

## 2024-04-12 LAB — PREPARE RBC (CROSSMATCH)

## 2024-04-12 LAB — URINALYSIS, ROUTINE W REFLEX MICROSCOPIC
Bilirubin Urine: NEGATIVE
Glucose, UA: NEGATIVE mg/dL
Hgb urine dipstick: NEGATIVE
Ketones, ur: NEGATIVE mg/dL
Leukocytes,Ua: NEGATIVE
Nitrite: NEGATIVE
Protein, ur: NEGATIVE mg/dL
Specific Gravity, Urine: 1.018 (ref 1.005–1.030)
pH: 7 (ref 5.0–8.0)

## 2024-04-12 LAB — CBC
HCT: 41 % (ref 39.0–52.0)
HCT: 31.7 % — ABNORMAL LOW (ref 39.0–52.0)
HCT: 29.5 % — ABNORMAL LOW (ref 39.0–52.0)
Hemoglobin: 12.9 g/dL — ABNORMAL LOW (ref 13.0–17.0)
Hemoglobin: 9.7 g/dL — ABNORMAL LOW (ref 13.0–17.0)
Hemoglobin: 9.3 g/dL — ABNORMAL LOW (ref 13.0–17.0)
MCH: 26.8 pg (ref 26.0–34.0)
MCH: 26.5 pg (ref 26.0–34.0)
MCH: 27.3 pg (ref 26.0–34.0)
MCHC: 31.5 g/dL (ref 30.0–36.0)
MCHC: 30.6 g/dL (ref 30.0–36.0)
MCHC: 31.5 g/dL (ref 30.0–36.0)
MCV: 85.1 fL (ref 80.0–100.0)
MCV: 86.6 fL (ref 80.0–100.0)
MCV: 86.5 fL (ref 80.0–100.0)
Platelets: 235 K/uL (ref 150–400)
Platelets: 194 K/uL (ref 150–400)
Platelets: 196 K/uL (ref 150–400)
RBC: 4.82 MIL/uL (ref 4.22–5.81)
RBC: 3.66 MIL/uL — ABNORMAL LOW (ref 4.22–5.81)
RBC: 3.41 MIL/uL — ABNORMAL LOW (ref 4.22–5.81)
RDW: 15.9 % — ABNORMAL HIGH (ref 11.5–15.5)
RDW: 15.9 % — ABNORMAL HIGH (ref 11.5–15.5)
RDW: 15.8 % — ABNORMAL HIGH (ref 11.5–15.5)
WBC: 5.6 K/uL (ref 4.0–10.5)
WBC: 9.4 K/uL (ref 4.0–10.5)
WBC: 8.5 K/uL (ref 4.0–10.5)
nRBC: 0 % (ref 0.0–0.2)
nRBC: 0 % (ref 0.0–0.2)
nRBC: 0 % (ref 0.0–0.2)

## 2024-04-12 LAB — BASIC METABOLIC PANEL WITH GFR
Anion gap: 8 (ref 5–15)
BUN: 14 mg/dL (ref 8–23)
CO2: 23 mmol/L (ref 22–32)
Calcium: 8.8 mg/dL — ABNORMAL LOW (ref 8.9–10.3)
Chloride: 108 mmol/L (ref 98–111)
Creatinine, Ser: 0.9 mg/dL (ref 0.61–1.24)
GFR, Estimated: >60 mL/min (ref >60)
Glucose, Bld: 121 mg/dL — ABNORMAL HIGH (ref 70–99)
Potassium: 4.2 mmol/L (ref 3.5–5.1)
Sodium: 139 mmol/L (ref 135–145)

## 2024-04-12 LAB — COMPREHENSIVE METABOLIC PANEL WITH GFR
ALT: 11 U/L (ref 0–44)
AST: 13 U/L — ABNORMAL LOW (ref 15–41)
Albumin: 3 g/dL — ABNORMAL LOW (ref 3.5–5.0)
Alkaline Phosphatase: 62 U/L (ref 38–126)
Anion gap: 8 (ref 5–15)
BUN: 16 mg/dL (ref 8–23)
CO2: 25 mmol/L (ref 22–32)
Calcium: 10 mg/dL (ref 8.9–10.3)
Chloride: 108 mmol/L (ref 98–111)
Creatinine, Ser: 0.83 mg/dL (ref 0.61–1.24)
GFR, Estimated: >60 mL/min (ref >60)
Glucose, Bld: 101 mg/dL — ABNORMAL HIGH (ref 70–99)
Potassium: 4 mmol/L (ref 3.5–5.1)
Sodium: 141 mmol/L (ref 135–145)
Total Bilirubin: 0.8 mg/dL (ref 0.0–1.2)
Total Protein: 6.2 g/dL — ABNORMAL LOW (ref 6.5–8.1)

## 2024-04-12 LAB — BPAM RBC: Blood Product Expiration Date: 202508122359

## 2024-04-12 LAB — MAGNESIUM: Magnesium: 1.8 mg/dL (ref 1.7–2.4)

## 2024-04-12 LAB — APTT: aPTT: 34 s (ref 24–36)

## 2024-04-12 LAB — POCT ACTIVATED CLOTTING TIME
Activated Clotting Time: 268 s
Activated Clotting Time: 233 s
Activated Clotting Time: 210 s

## 2024-04-12 LAB — PROTIME-INR
INR: 1.1 (ref 0.8–1.2)
INR: 1.4 — ABNORMAL HIGH (ref 0.8–1.2)
Prothrombin Time: 15.1 s (ref 11.4–15.2)
Prothrombin Time: 17.4 s — ABNORMAL HIGH (ref 11.4–15.2)

## 2024-04-12 LAB — ABO/RH: ABO/RH(D): O POS

## 2024-04-12 SURGERY — INSERTION, ENDOVASCULAR STENT GRAFT, AORTA, ABDOMINAL
Anesthesia: General | Site: Groin | Laterality: Right

## 2024-04-12 MED ORDER — HEPARIN SODIUM (PORCINE) 1000 UNIT/ML IJ SOLN
INTRAMUSCULAR | Status: DC | PRN
  Administered 2024-04-12: 7000 [IU] via INTRAVENOUS

## 2024-04-12 MED ORDER — PROPOFOL 10 MG/ML IV BOLUS
INTRAVENOUS | Status: DC | PRN
  Administered 2024-04-12: 70 mg via INTRAVENOUS

## 2024-04-12 MED ORDER — VASOPRESSIN 20 UNIT/ML IV SOLN
INTRAVENOUS | Status: AC
  Filled 2024-04-12: qty 1

## 2024-04-12 MED ORDER — HEPARIN SODIUM (PORCINE) 5000 UNIT/ML IJ SOLN
5000.0000 [IU] | Freq: Two times a day (BID) | INTRAMUSCULAR | Status: DC
  Administered 2024-04-13 (×2): 5000 [IU] via SUBCUTANEOUS
  Filled 2024-04-12 (×2): qty 1

## 2024-04-12 MED ORDER — PROTAMINE SULFATE 10 MG/ML IV SOLN
INTRAVENOUS | Status: AC
  Filled 2024-04-12: qty 25

## 2024-04-12 MED ORDER — PROTAMINE SULFATE 10 MG/ML IV SOLN
INTRAVENOUS | Status: DC | PRN
  Administered 2024-04-12: 50 mg via INTRAVENOUS

## 2024-04-12 MED ORDER — LIDOCAINE 2% (20 MG/ML) 5 ML SYRINGE
INTRAMUSCULAR | Status: DC | PRN
  Administered 2024-04-12: 40 mg via INTRAVENOUS

## 2024-04-12 MED ORDER — HEPARIN 6000 UNIT IRRIGATION SOLUTION
Status: AC
  Filled 2024-04-12: qty 500

## 2024-04-12 MED ORDER — AMLODIPINE BESYLATE 10 MG PO TABS
10.0000 mg | ORAL_TABLET | Freq: Every day | ORAL | Status: DC
  Administered 2024-04-13 – 2024-04-14 (×2): 10 mg via ORAL
  Filled 2024-04-12 (×2): qty 1

## 2024-04-12 MED ORDER — LOSARTAN POTASSIUM 50 MG PO TABS
100.0000 mg | ORAL_TABLET | Freq: Every day | ORAL | Status: DC
  Administered 2024-04-12 – 2024-04-14 (×3): 100 mg via ORAL
  Filled 2024-04-12 (×3): qty 2

## 2024-04-12 MED ORDER — CEFAZOLIN SODIUM-DEXTROSE 2-4 GM/100ML-% IV SOLN
2.0000 g | Freq: Three times a day (TID) | INTRAVENOUS | Status: AC
  Administered 2024-04-12 – 2024-04-13 (×2): 2 g via INTRAVENOUS
  Filled 2024-04-12 (×2): qty 100

## 2024-04-12 MED ORDER — FENTANYL CITRATE (PF) 250 MCG/5ML IJ SOLN
INTRAMUSCULAR | Status: AC
  Filled 2024-04-12: qty 5

## 2024-04-12 MED ORDER — CALCIUM CHLORIDE 10 % IV SOLN
INTRAVENOUS | Status: AC
  Filled 2024-04-12: qty 10

## 2024-04-12 MED ORDER — METOPROLOL TARTRATE 5 MG/5ML IV SOLN: 2.5000 mg | INTRAVENOUS | Status: DC | PRN

## 2024-04-12 MED ORDER — ALBUTEROL SULFATE HFA 108 (90 BASE) MCG/ACT IN AERS
INHALATION_SPRAY | RESPIRATORY_TRACT | Status: DC | PRN
Start: 2024-04-12 — End: 2024-04-12
  Administered 2024-04-12 (×2): 4 via RESPIRATORY_TRACT

## 2024-04-12 MED ORDER — ROCURONIUM BROMIDE 10 MG/ML (PF) SYRINGE
PREFILLED_SYRINGE | INTRAVENOUS | Status: AC
  Filled 2024-04-12: qty 10

## 2024-04-12 MED ORDER — CHLORHEXIDINE GLUCONATE 0.12 % MT SOLN
15.0000 mL | Freq: Once | OROMUCOSAL | Status: AC
  Administered 2024-04-12: 15 mL via OROMUCOSAL
  Filled 2024-04-12: qty 15

## 2024-04-12 MED ORDER — BISACODYL 5 MG PO TBEC: 5.0000 mg | DELAYED_RELEASE_TABLET | Freq: Every day | ORAL | Status: DC | PRN

## 2024-04-12 MED ORDER — ACETAMINOPHEN 650 MG RE SUPP: 325.0000 mg | RECTAL | Status: DC | PRN

## 2024-04-12 MED ORDER — DOCUSATE SODIUM 100 MG PO CAPS
100.0000 mg | ORAL_CAPSULE | Freq: Every day | ORAL | Status: DC
  Administered 2024-04-13 – 2024-04-14 (×2): 100 mg via ORAL
  Filled 2024-04-12 (×2): qty 1

## 2024-04-12 MED ORDER — BUDESON-GLYCOPYRROL-FORMOTEROL 160-9-4.8 MCG/ACT IN AERO
2.0000 | INHALATION_SPRAY | Freq: Two times a day (BID) | RESPIRATORY_TRACT | Status: DC
  Administered 2024-04-12 – 2024-04-14 (×4): 2 via RESPIRATORY_TRACT
  Filled 2024-04-12: qty 5.9

## 2024-04-12 MED ORDER — MIDAZOLAM HCL 2 MG/2ML IJ SOLN: 0.5000 mg | Freq: Once | INTRAMUSCULAR | Status: DC | PRN

## 2024-04-12 MED ORDER — NOREPINEPHRINE 4 MG/250ML-% IV SOLN
INTRAVENOUS | Status: AC
  Filled 2024-04-12: qty 250

## 2024-04-12 MED ORDER — ONDANSETRON HCL 4 MG/2ML IJ SOLN
INTRAMUSCULAR | Status: DC | PRN
  Administered 2024-04-12: 4 mg via INTRAVENOUS

## 2024-04-12 MED ORDER — ASPIRIN 81 MG PO TBEC
81.0000 mg | DELAYED_RELEASE_TABLET | Freq: Every day | ORAL | Status: DC
  Administered 2024-04-13 – 2024-04-14 (×2): 81 mg via ORAL
  Filled 2024-04-12 (×2): qty 1

## 2024-04-12 MED ORDER — FENTANYL CITRATE (PF) 100 MCG/2ML IJ SOLN: 25.0000 ug | INTRAMUSCULAR | Status: DC | PRN

## 2024-04-12 MED ORDER — SODIUM CHLORIDE (PF) 0.9 % IJ SOLN
INTRAMUSCULAR | Status: AC
  Filled 2024-04-12: qty 20

## 2024-04-12 MED ORDER — ORAL CARE MOUTH RINSE: 15.0000 mL | Freq: Once | OROMUCOSAL | Status: AC

## 2024-04-12 MED ORDER — PANTOPRAZOLE SODIUM 40 MG PO TBEC
40.0000 mg | DELAYED_RELEASE_TABLET | Freq: Every day | ORAL | Status: DC
  Administered 2024-04-13 – 2024-04-14 (×2): 40 mg via ORAL
  Filled 2024-04-12 (×2): qty 1

## 2024-04-12 MED ORDER — CHLORHEXIDINE GLUCONATE CLOTH 2 % EX PADS: 6.0000 | MEDICATED_PAD | Freq: Once | CUTANEOUS | Status: DC

## 2024-04-12 MED ORDER — PHENOL 1.4 % MT LIQD: 1.0000 | OROMUCOSAL | Status: DC | PRN

## 2024-04-12 MED ORDER — ONDANSETRON HCL 4 MG/2ML IJ SOLN
INTRAMUSCULAR | Status: AC
  Filled 2024-04-12: qty 2

## 2024-04-12 MED ORDER — SENNOSIDES-DOCUSATE SODIUM 8.6-50 MG PO TABS: 1.0000 | ORAL_TABLET | Freq: Every evening | ORAL | Status: DC | PRN

## 2024-04-12 MED ORDER — OXYCODONE HCL 5 MG/5ML PO SOLN: 5.0000 mg | Freq: Once | ORAL | Status: DC | PRN

## 2024-04-12 MED ORDER — EPHEDRINE 5 MG/ML INJ
INTRAVENOUS | Status: AC
  Filled 2024-04-12: qty 5

## 2024-04-12 MED ORDER — ACETAMINOPHEN 500 MG PO TABS
1000.0000 mg | ORAL_TABLET | Freq: Once | ORAL | Status: AC
  Administered 2024-04-12: 1000 mg via ORAL
  Filled 2024-04-12: qty 2

## 2024-04-12 MED ORDER — SUGAMMADEX SODIUM 200 MG/2ML IV SOLN
INTRAVENOUS | Status: DC | PRN
  Administered 2024-04-12: 400 mg via INTRAVENOUS

## 2024-04-12 MED ORDER — LABETALOL HCL 5 MG/ML IV SOLN: 10.0000 mg | INTRAVENOUS | Status: DC | PRN

## 2024-04-12 MED ORDER — PHENYLEPHRINE HCL-NACL 20-0.9 MG/250ML-% IV SOLN
INTRAVENOUS | Status: DC | PRN
  Administered 2024-04-12: 20 ug/min via INTRAVENOUS

## 2024-04-12 MED ORDER — HEPARIN 6000 UNIT IRRIGATION SOLUTION
Status: DC | PRN
  Administered 2024-04-12 (×3): 1

## 2024-04-12 MED ORDER — PROPOFOL 10 MG/ML IV BOLUS
INTRAVENOUS | Status: AC
Start: 2024-04-12 — End: 2024-04-12
  Filled 2024-04-12: qty 20

## 2024-04-12 MED ORDER — PHENYLEPHRINE 80 MCG/ML (10ML) SYRINGE FOR IV PUSH (FOR BLOOD PRESSURE SUPPORT)
PREFILLED_SYRINGE | INTRAVENOUS | Status: AC
Start: 2024-04-12 — End: 2024-04-12
  Filled 2024-04-12: qty 10

## 2024-04-12 MED ORDER — ROCURONIUM BROMIDE 10 MG/ML (PF) SYRINGE
PREFILLED_SYRINGE | INTRAVENOUS | Status: DC | PRN
  Administered 2024-04-12 (×2): 10 mg via INTRAVENOUS
  Administered 2024-04-12: 60 mg via INTRAVENOUS
  Administered 2024-04-12: 10 mg via INTRAVENOUS

## 2024-04-12 MED ORDER — FLEET ENEMA RE ENEM
1.0000 | ENEMA | Freq: Once | RECTAL | Status: DC | PRN
Start: 2024-04-12 — End: 2024-04-14

## 2024-04-12 MED ORDER — EPHEDRINE SULFATE-NACL 50-0.9 MG/10ML-% IV SOSY
PREFILLED_SYRINGE | INTRAVENOUS | Status: DC | PRN
  Administered 2024-04-12: 5 mg via INTRAVENOUS
  Administered 2024-04-12: 2.5 mg via INTRAVENOUS

## 2024-04-12 MED ORDER — FENTANYL CITRATE (PF) 250 MCG/5ML IJ SOLN
INTRAMUSCULAR | Status: DC | PRN
  Administered 2024-04-12 (×3): 50 ug via INTRAVENOUS
  Administered 2024-04-12: 100 ug via INTRAVENOUS

## 2024-04-12 MED ORDER — HYDROMORPHONE HCL 1 MG/ML IJ SOLN: 0.5000 mg | INTRAMUSCULAR | Status: DC | PRN

## 2024-04-12 MED ORDER — ACETAMINOPHEN 325 MG PO TABS: 325.0000 mg | ORAL_TABLET | ORAL | Status: DC | PRN

## 2024-04-12 MED ORDER — DEXAMETHASONE SODIUM PHOSPHATE 10 MG/ML IJ SOLN
INTRAMUSCULAR | Status: DC | PRN
  Administered 2024-04-12: 10 mg via INTRAVENOUS

## 2024-04-12 MED ORDER — TRAMADOL HCL 50 MG PO TABS: 50.0000 mg | ORAL_TABLET | Freq: Four times a day (QID) | ORAL | Status: DC | PRN

## 2024-04-12 MED ORDER — ATORVASTATIN CALCIUM 10 MG PO TABS
20.0000 mg | ORAL_TABLET | Freq: Every day | ORAL | Status: DC
  Administered 2024-04-13 – 2024-04-14 (×2): 20 mg via ORAL
  Filled 2024-04-12 (×2): qty 2

## 2024-04-12 MED ORDER — ALBUMIN HUMAN 5 % IV SOLN: INTRAVENOUS | Status: DC | PRN

## 2024-04-12 MED ORDER — TAMSULOSIN HCL 0.4 MG PO CAPS
0.4000 mg | ORAL_CAPSULE | Freq: Every morning | ORAL | Status: DC
  Administered 2024-04-13 – 2024-04-14 (×2): 0.4 mg via ORAL
  Filled 2024-04-12 (×2): qty 1

## 2024-04-12 MED ORDER — CEFAZOLIN SODIUM-DEXTROSE 2-4 GM/100ML-% IV SOLN
2.0000 g | INTRAVENOUS | Status: AC
  Administered 2024-04-12: 2 g via INTRAVENOUS
  Filled 2024-04-12: qty 100

## 2024-04-12 MED ORDER — PHENYLEPHRINE 80 MCG/ML (10ML) SYRINGE FOR IV PUSH (FOR BLOOD PRESSURE SUPPORT)
PREFILLED_SYRINGE | INTRAVENOUS | Status: DC | PRN
  Administered 2024-04-12 (×2): 80 ug via INTRAVENOUS
  Administered 2024-04-12: 160 ug via INTRAVENOUS
  Administered 2024-04-12: 240 ug via INTRAVENOUS
  Administered 2024-04-12 (×2): 80 ug via INTRAVENOUS

## 2024-04-12 MED ORDER — ALBUTEROL SULFATE (2.5 MG/3ML) 0.083% IN NEBU
3.0000 mL | INHALATION_SOLUTION | Freq: Four times a day (QID) | RESPIRATORY_TRACT | Status: DC | PRN
  Administered 2024-04-14: 3 mL via RESPIRATORY_TRACT
  Filled 2024-04-12: qty 3

## 2024-04-12 MED ORDER — 0.9 % SODIUM CHLORIDE (POUR BTL) OPTIME
TOPICAL | Status: DC | PRN
Start: 2024-04-12 — End: 2024-04-12
  Administered 2024-04-12: 1000 mL

## 2024-04-12 MED ORDER — SODIUM CHLORIDE 0.9 % IV SOLN: INTRAVENOUS | Status: DC

## 2024-04-12 MED ORDER — SODIUM CHLORIDE 0.9 % IV SOLN: INTRAVENOUS | Status: AC

## 2024-04-12 MED ORDER — LIDOCAINE 2% (20 MG/ML) 5 ML SYRINGE
INTRAMUSCULAR | Status: AC
  Filled 2024-04-12: qty 5

## 2024-04-12 MED ORDER — POTASSIUM CHLORIDE CRYS ER 20 MEQ PO TBCR: 40.0000 meq | EXTENDED_RELEASE_TABLET | Freq: Every day | ORAL | Status: DC | PRN

## 2024-04-12 MED ORDER — IODIXANOL 320 MG/ML IV SOLN
INTRAVENOUS | Status: DC | PRN
  Administered 2024-04-12: 156.8 mL via INTRA_ARTERIAL

## 2024-04-12 MED ORDER — OXYCODONE HCL 5 MG PO TABS: 5.0000 mg | ORAL_TABLET | Freq: Once | ORAL | Status: DC | PRN

## 2024-04-12 MED ORDER — DEXAMETHASONE SODIUM PHOSPHATE 10 MG/ML IJ SOLN
INTRAMUSCULAR | Status: AC
  Filled 2024-04-12: qty 1

## 2024-04-12 MED ORDER — HYDRALAZINE HCL 20 MG/ML IJ SOLN: 5.0000 mg | INTRAMUSCULAR | Status: DC | PRN

## 2024-04-12 MED ORDER — LACTATED RINGERS IV SOLN: INTRAVENOUS | Status: DC | PRN

## 2024-04-12 SURGICAL SUPPLY — 57 items
BAG COUNTER SPONGE SURGICOUNT (BAG) ×3 IMPLANT
BLADE CLIPPER SURG (BLADE) ×3 IMPLANT
CANISTER SUCTION 3000ML PPV (SUCTIONS) ×3 IMPLANT
CATH BEACON 5 .035 65 KMP TIP (CATHETERS) IMPLANT
CATH BEACON 5.038 65CM KMP-01 (CATHETERS) ×3 IMPLANT
CATH OMNI FLUSH .035X70CM (CATHETERS) ×3 IMPLANT
CLIP CASSETTE HELI-FX 12MM (Vascular Products) IMPLANT
CLIP LIGATING EXTRA MED SLVR (CLIP) IMPLANT
CLIP LIGATING EXTRA SM BLUE (MISCELLANEOUS) IMPLANT
CLOSURE PERCLOSE PROSTYLE (VASCULAR PRODUCTS) IMPLANT
DERMABOND ADVANCED .7 DNX12 (GAUZE/BANDAGES/DRESSINGS) ×3 IMPLANT
DEVICE CLOSURE PERCLS PRGLD 6F (VASCULAR PRODUCTS) ×12 IMPLANT
DRSG COVADERM 4X6 (GAUZE/BANDAGES/DRESSINGS) IMPLANT
DRSG TEGADERM 2-3/8X2-3/4 SM (GAUZE/BANDAGES/DRESSINGS) ×6 IMPLANT
ELECTRODE REM PT RTRN 9FT ADLT (ELECTROSURGICAL) ×6 IMPLANT
EXCLUDER EXT ENDO 36X4.5 18F (Endovascular Graft) IMPLANT
EXCLUDER TNK END 36X14.5X14 18 (Endovascular Graft) IMPLANT
GAUZE 4X4 16PLY ~~LOC~~+RFID DBL (SPONGE) ×3 IMPLANT
GAUZE SPONGE 2X2 8PLY STRL LF (GAUZE/BANDAGES/DRESSINGS) ×6 IMPLANT
GLIDEWIRE ADV .035X180CM (WIRE) ×3 IMPLANT
GLOVE BIO SURGEON STRL SZ7.5 (GLOVE) ×3 IMPLANT
GOWN STRL REUS W/ TWL LRG LVL3 (GOWN DISPOSABLE) ×6 IMPLANT
GOWN STRL REUS W/ TWL XL LVL3 (GOWN DISPOSABLE) ×6 IMPLANT
GRAFT BALLN CATH 65CM (BALLOONS) ×3 IMPLANT
GUIDE HELI-FX 28MM (VASCULAR PRODUCTS) IMPLANT
KIT BASIN OR (CUSTOM PROCEDURE TRAY) ×3 IMPLANT
KIT DRAIN CSF ACCUDRAIN (MISCELLANEOUS) IMPLANT
KIT ENCORE 26 ADVANTAGE (KITS) IMPLANT
KIT TURNOVER KIT B (KITS) ×3 IMPLANT
NS IRRIG 1000ML POUR BTL (IV SOLUTION) ×3 IMPLANT
PACK ENDOVASCULAR (PACKS) ×3 IMPLANT
PAD ARMBOARD POSITIONER FOAM (MISCELLANEOUS) ×6 IMPLANT
POWDER SURGICEL 3.0 GRAM (HEMOSTASIS) IMPLANT
SET MICROPUNCTURE 5F STIFF (MISCELLANEOUS) ×3 IMPLANT
SHEATH BRITE TIP 8FR 23CM (SHEATH) ×3 IMPLANT
SHEATH DRYSEAL FLEX 12FR 33CM (SHEATH) IMPLANT
SHEATH DRYSEAL FLEX 18FR 33CM (SHEATH) IMPLANT
SHEATH PINNACLE 8F 10CM (SHEATH) ×3 IMPLANT
SPONGE T-LAP 18X18 ~~LOC~~+RFID (SPONGE) ×6 IMPLANT
STAPLER SKIN PROX 35W (STAPLE) IMPLANT
STENT GRAFT CONTRALAT 16X12X10 (Vascular Products) IMPLANT
STENT GRAFT CONTRALAT 16X13.5 (Endovascular Graft) IMPLANT
STENT GRAFT CONTRALAT 20X13.5 (Vascular Products) IMPLANT
STOPCOCK MORSE 400PSI 3WAY (MISCELLANEOUS) ×3 IMPLANT
SUT MNCRL AB 4-0 PS2 18 (SUTURE) ×6 IMPLANT
SUT PROLENE 5 0 C 1 24 (SUTURE) IMPLANT
SUT PROLENE 6 0 BV (SUTURE) IMPLANT
SUT SILK 3-0 18XBRD TIE 12 (SUTURE) IMPLANT
SUT VIC AB 2-0 CT1 TAPERPNT 27 (SUTURE) IMPLANT
SUT VIC AB 3-0 SH 27X BRD (SUTURE) IMPLANT
SYR 20ML LL LF (SYRINGE) ×3 IMPLANT
SYR BULB IRRIG 60ML STRL (SYRINGE) IMPLANT
TOWEL GREEN STERILE (TOWEL DISPOSABLE) ×3 IMPLANT
TRAY FOLEY MTR SLVR 16FR STAT (SET/KITS/TRAYS/PACK) ×3 IMPLANT
TUBING INJECTOR 48 (MISCELLANEOUS) ×3 IMPLANT
WIRE AMPLATZ SS-J .035X180CM (WIRE) ×3 IMPLANT
WIRE BENTSON .035X145CM (WIRE) ×6 IMPLANT

## 2024-04-12 NOTE — Op Note (Signed)
 Patient name: Martin Santiago MRN: [id number] DOB: 04-12-1951 Sex: male  04/12/2024 Pre-operative Diagnosis: Abdominal aortic aneurysm Post-operative diagnosis:  Same Surgeon:  Penne C. Sheree, MD Assistants: Malvina New, MD; Curry Damme, [place] Procedure Performed: 1.  Percutaneous ultrasound-guided cannulation and closure bilateral common femoral arteries 2.  Cutdown right common femoral artery with primary repair of arteriotomy 3.  Endovascular aortic aneurysm repair with Gore conformable excluder main body right 36 x 14 x 14 extended with 12 x 10 cm device and contralateral limb initially 20 x 14 extended with 16 x 16 cm limb.  36 x 4.5 cm proximal calf 4.  Placement of Endo anchors  Indications: 73 year old male with history of 10 cm abdominal aortic aneurysm appears to be difficult endovascular repair he is not a candidate for open repair given underlying medical comorbidities.  We have discussed the high risk nature of this procedure and he demonstrates good understanding and agrees to proceed.  Experience assistants were necessary to facilitate percutaneous access as well as difficult placement of wires, catheters and sheaths as well as deployment of devices.  They were also necessary to facilitate exposure of the common femoral artery on the right  with primary repair.  Findings: The bilateral common femoral arteries were heavily calcified however there were soft areas more proximal in the common femoral arteries that were initially cannulated percutaneously.  The common iliac arteries bilaterally were heavily calcified the right side was less tortuous and an 18 French sheath was placed easily through the right side on the left side a 12 French sheath had some difficulty but we were unable to get this into the aorta.  After endovascular aneurysm repair we initially had type Ia endoleak that we placed Endo anchors followed by a cuff and there was still a slight left-sided type Ia endoleak  but did not fill the aneurysm sac and elected no further intervention.  There was a hematoma in the right retroperitoneum and ultimately identified extravasation from the cannulation site in the right common femoral artery and elected to cutdown primarily repair of this and drained the hematoma.  The left side was percutaneously closed.  At completion there was strong signals of the bilateral feet and the patient's heparinization was reversed.   Procedure:  The patient was identified in the holding area and taken to the operating room where he was put supine operative table and general anesthesia was induced.  He was sterilely prepped and draped the abdomen in the bilateral groins in the usual fashion, antibiotics were administered a timeout was called.  We began using ultrasound to identify the left common femoral artery which appeared to have calcium  distally in actually appear to be subtotally occluded.  Proximally the anterior wall of the artery was soft and we cannulated this percutaneously with a micropuncture needle followed by wire and our sheath.  We then placed a Bentson wire using fluoroscopic guidance as there was significant tortuosity in the iliac arteries that the wire would not cross.  We then placed an 8 French sheath and were able to get the wire into the aorta and then deployed a ProGlide device immediately.  We had to again placed the 8 French sheath back and get the wire past the iliac arteries and then deployed the lateral Pro-glide device and an 8 French sheath was then placed and flushed.  On the right side similarly the common femoral artery was diseased distally but approximately the anterior wall was soft and amenable for percutaneous  access.  We cannulated this with a micropuncture needle and again a wire and sheath.  We performed retrograde angiography and then placed a Bentson wire and on the right side this did go into the aorta.  We deployed a medial and lateral ProGlide device at  10:00 and 2:00 and then placed an 8 [place] sheath.  Stiff wires were placed into the aorta and on the left side a 12 French sheath was placed in the right side and 18 French sheath was placed and the patient was fully heparinized.  The device was placed from the right side up to the level of the top of L1 and from the left side we performed aortogram.  The device was then deployed just off the left renal artery.  We were then able to easily cannulate the gate from the left side and spin an Omni Flush catheter.  A stiff wire was placed.  We then perform retrograde angiography from the left and multiple views to finally identify the tortuous common iliac artery bifurcation.  We initially extended down with a 20 x 14 cm limb and then extended out to the common iliac artery bifurcation with a 16 x 16 mm limb and these were ironed out with M OB balloon.  We then turned our attention to the right side and fully deployed the device and then extended down with a 12 x 10 cm limb to the common iliac artery bifurcation.  MLB balloon was then used to ironed out the entirety of the device.  On completion there was adequate seal distally and overlap between the device pieces.  There were no type Ib endoleak's and there was filling of the hypogastric arteries bilaterally.  Unfortunately there were bilateral type Ia endoleak's.  We then exchanged for the 16 [place] Endo anchor deployment device and were able to deploy an Endo anchor on the right side at the AP projection and then right and left side at the 30 degree LAO and 30 degree RAO projections.  In a 60 degree LAO projection we were able to get an a left sided Endo anchor for a total of 6.  Unfortunately we had difficulty with the deployment device and I elected for no further Endo anchors.  Completion demonstrated persistent endoleak although it was improved.  We then placed a 36 x 4.5 cm cuff up to just the level of the left renal artery and then ironed to this with and will  be balloon.  Completion demonstrated a late type Ia endoleak but with no filling of the sac and we elected for no further intervention.  We then exchanged for a Bentson wires bilaterally.  On the left side we deployed the Pro-glide devices and there was adequate flow prior to completion Pro-glide of the device through the micropuncture sheath we completed this in the left foot did have adequate signals.  On the right side we deployed the Pro-glide devices and then perform retrograde angiography which demonstrated filling of a large hematoma in the retroperitoneum.  With this we elected for direct cutdown.  We then extended the incision in the right groin cephalad and caudally in a vertical direction.  We dissected directly down to the artery and we able to clamp it proximally where it was soft.  Distally we just held pressure.  We primarily closed the artery with a running 5-0 Prolene suture.  Upon completion there were then strong signals on the right side as well and 25 mg of protamine was administered.  We evacuated the retroperitoneal hematoma and thoroughly irrigated the wound.  We then closed the wound with interrupted 2-0 and 3-0 Vicryl and staples were placed at the skin level followed by a sterile dressing.  The patient was then awakened from anesthesia having tolerated the procedure without any complication.  All counts were correct at completion.  Contrast: 156cc  EBL: 500cc  Rykar Lebleu C. Sheree, MD [facility] Office: [phone] Pager: [phone]

## 2024-04-12 NOTE — Transfer of Care (Signed)
 Immediate Anesthesia Transfer of Care Note  Patient: Martin Santiago  Procedure(s) Performed: INSERTION, ENDOVASCULAR STENT GRAFT, AORTA, ABDOMINAL  Patient Location: PACU  Anesthesia Type:General  Level of Consciousness: drowsy and patient cooperative  Airway & Oxygen  Therapy: Patient connected to face mask oxygen   Post-op Assessment: Report given to RN and Post -op Vital signs reviewed and stable  Post vital signs: Reviewed and stable  Last Vitals:  Vitals Value Taken Time  BP 119/85 04/12/24 17:45  Temp    Pulse 96 04/12/24 17:45  Resp 15 04/12/24 17:45  SpO2 97 % 04/12/24 17:45  Vitals shown include unfiled device data.  Last Pain:  Vitals:   04/12/24 1124  TempSrc:   PainSc: 0-No pain         Complications: There were no known notable events for this encounter.

## 2024-04-12 NOTE — Interval H&P Note (Signed)
 History and Physical Interval Note:  04/12/2024 10:26 AM  Martin Santiago  has presented today for surgery, with the diagnosis of AAA W/O RUPTURE.  The various methods of treatment have been discussed with the patient and family. After consideration of risks, benefits and other options for treatment, the patient has consented to  Procedure(s): INSERTION, ENDOVASCULAR STENT GRAFT, AORTA, ABDOMINAL (N/A) as a surgical intervention.  The patient's history has been reviewed, patient examined, no change in status, stable for surgery.  I have reviewed the patient's chart and labs.  Questions were answered to the patient's satisfaction.     [name]

## 2024-04-12 NOTE — Anesthesia Procedure Notes (Signed)
 Procedure Name: Intubation Date/Time: 04/12/2024 2:12 PM  Performed by: Christopher Comings, CRNAPre-anesthesia Checklist: Patient identified, Emergency Drugs available, Suction available and Patient being monitored Patient Re-evaluated:Patient Re-evaluated prior to induction Oxygen  Delivery Method: Circle system utilized Preoxygenation: Pre-oxygenation with 100% oxygen  Induction Type: IV induction Ventilation: Mask ventilation without difficulty Laryngoscope Size: Mac and 4 Grade View: Grade II Tube type: Oral Tube size: 7.0 mm Number of attempts: 1 Airway Equipment and Method: Stylet and Oral airway Placement Confirmation: ETT inserted through vocal cords under direct vision, positive ETCO2 and breath sounds checked- equal and bilateral Secured at: 22 cm Tube secured with: Tape Dental Injury: Teeth and Oropharynx as per pre-operative assessment

## 2024-04-12 NOTE — Anesthesia Postprocedure Evaluation (Signed)
 Anesthesia Post Note  Patient: Martin Santiago  Procedure(s) Performed: INSERTION, ENDOVASCULAR STENT GRAFT, AORTA, ABDOMINAL REPAIR, ARTERY, FEMORAL (Right: Groin) ULTRASOUND GUIDANCE, FOR VASCULAR ACCESS (Bilateral: Groin)     Patient location during evaluation: PACU Anesthesia Type: General Level of consciousness: awake and alert, oriented and patient cooperative Pain management: pain level controlled Vital Signs Assessment: post-procedure vital signs reviewed and stable Respiratory status: spontaneous breathing, nonlabored ventilation, respiratory function stable and patient connected to nasal cannula oxygen  Cardiovascular status: blood pressure returned to baseline and stable Postop Assessment: no apparent nausea or vomiting Anesthetic complications: no   There were no known notable events for this encounter.  Last Vitals:  Vitals:   04/12/24 1745 04/12/24 1800  BP: 119/85 105/72  Pulse: 93 98  Resp: 17 17  Temp:    SpO2: 96% 91%    Last Pain:  Vitals:   04/12/24 1800  TempSrc:   PainSc: 0-No pain                 Jimmie Rueter,E. Rosaria Kubin

## 2024-04-12 NOTE — Discharge Instructions (Signed)

## 2024-04-12 NOTE — Progress Notes (Signed)
 Pt arrived from PACU in bed. CHG bath, foley care, telemetry applied and verified with CCMD. Pt bilateral groins checked without hematoma. Left groin with stained dressing marked. Aline cord not set up. Called staff to locate one while reporting off. Ann Nena Hoard, RN

## 2024-04-12 NOTE — Anesthesia Procedure Notes (Signed)
 Arterial Line Insertion Start/End7/18/2025 10:50 AM, 04/12/2024 10:57 AM Performed by: Zelphia Norleen HERO, CRNA, CRNA  Patient location: Pre-op. Preanesthetic checklist: patient identified, IV checked, site marked, risks and benefits discussed, surgical consent, monitors and equipment checked, pre-op evaluation, timeout performed and anesthesia consent Lidocaine  1% used for infiltration Left, radial was placed Catheter size: 20 G Hand hygiene performed  and maximum sterile barriers used   Attempts: 1 Procedure performed without using ultrasound guided technique. Following insertion, dressing applied and Biopatch. Post procedure assessment: normal and unchanged  Patient tolerated the procedure well with no immediate complications. Additional procedure comments: SRNA x 1 attempt.

## 2024-04-12 NOTE — Progress Notes (Signed)
 Called cath lab to report bleeding from left groin. No answer. Pt bp dropping and complaining of feeling hot and dizzy.  Gave ordered NS @ 75 and called MD while second RN held pressure to left groin.  Difficulty getting aline set-up. Pt aline set up without correct cap and opened new pack to replace cap. Working to get accurate aline reading and going off cuff pressures. Dopplered DP and PT pulses. MD arrived and new orders given. Reported all to PM RN. Will continue to monitor pt with frequent groin checks and v/s. Ann Nena Hoard, RN

## 2024-04-13 DIAGNOSIS — Z95828 Presence of other vascular implants and grafts: Secondary | ICD-10-CM

## 2024-04-13 LAB — BASIC METABOLIC PANEL WITH GFR
Anion gap: 6 (ref 5–15)
BUN: 15 mg/dL (ref 8–23)
CO2: 24 mmol/L (ref 22–32)
Calcium: 8.8 mg/dL — ABNORMAL LOW (ref 8.9–10.3)
Chloride: 108 mmol/L (ref 98–111)
Creatinine, Ser: 0.87 mg/dL (ref 0.61–1.24)
GFR, Estimated: >60 mL/min (ref >60)
Glucose, Bld: 179 mg/dL — ABNORMAL HIGH (ref 70–99)
Potassium: 4 mmol/L (ref 3.5–5.1)
Sodium: 138 mmol/L (ref 135–145)

## 2024-04-13 LAB — CBC
HCT: 28.4 % — ABNORMAL LOW (ref 39.0–52.0)
Hemoglobin: 8.8 g/dL — ABNORMAL LOW (ref 13.0–17.0)
MCH: 26.7 pg (ref 26.0–34.0)
MCHC: 31 g/dL (ref 30.0–36.0)
MCV: 86.1 fL (ref 80.0–100.0)
Platelets: 179 K/uL (ref 150–400)
RBC: 3.3 MIL/uL — ABNORMAL LOW (ref 4.22–5.81)
RDW: 15.9 % — ABNORMAL HIGH (ref 11.5–15.5)
WBC: 6.9 K/uL (ref 4.0–10.5)
nRBC: 0 % (ref 0.0–0.2)

## 2024-04-13 LAB — APTT: aPTT: 32 s (ref 24–36)

## 2024-04-13 MED ORDER — NICOTINE 14 MG/24HR TD PT24
14.0000 mg | MEDICATED_PATCH | Freq: Every day | TRANSDERMAL | Status: DC
  Administered 2024-04-13 – 2024-04-14 (×2): 14 mg via TRANSDERMAL
  Filled 2024-04-13 (×2): qty 1

## 2024-04-13 NOTE — Plan of Care (Signed)

## 2024-04-13 NOTE — Progress Notes (Addendum)
  Progress Note    04/13/2024 9:12 AM 1 Day Post-Op  Subjective:  no complaints. Denies any back or abdominal pain.   Vitals:   04/13/24 0836 04/13/24 0901  BP:  102/70  Pulse:  90  Resp:  19  Temp:  98.3 F (36.8 C)  SpO2: 94% 93%   Physical Exam: Cardiac:  regular Lungs:  non labored Incisions:  Right groin incision clean, dry and intact. Hematoma present, soft. Left CF access site soft without swelling or hematoma Extremities:  BLE well perfused and warm with palpable DP Abdomen:  soft Neurologic: alert and oriented   CBC    Component Value Date/Time   WBC 6.9 04/13/2024 0310   RBC 3.30 (L) 04/13/2024 0310   HGB 8.8 (L) 04/13/2024 0310   HCT 28.4 (L) 04/13/2024 0310   PLT 179 04/13/2024 0310   MCV 86.1 04/13/2024 0310   MCH 26.7 04/13/2024 0310   MCHC 31.0 04/13/2024 0310   RDW 15.9 (H) 04/13/2024 0310   LYMPHSABS 0.8 04/06/2023 1129   MONOABS 0.7 04/06/2023 1129   EOSABS 0.2 04/06/2023 1129   BASOSABS 0.1 04/06/2023 1129    BMET    Component Value Date/Time   NA 138 04/13/2024 0310   K 4.0 04/13/2024 0310   CL 108 04/13/2024 0310   CO2 24 04/13/2024 0310   GLUCOSE 179 (H) 04/13/2024 0310   BUN 15 04/13/2024 0310   CREATININE 0.87 04/13/2024 0310   CALCIUM  8.8 (L) 04/13/2024 0310   GFRNONAA >60 04/13/2024 0310   GFRAA >60 06/14/2017 0705    INR    Component Value Date/Time   INR 1.4 (H) 04/12/2024 1800     Intake/Output Summary (Last 24 hours) at 04/13/2024 0912 Last data filed at 04/13/2024 9391 Gross per 24 hour  Intake 4472.73 ml  Output 1260 ml  Net 3212.73 ml     Assessment/Plan:  73 y.o. male is 1 Day Post-Op from EVAR with right groin cutdown   Percutaneous ultrasound-guided cannulation and closure bilateral common femoral arteries 2.  Cutdown right common femoral artery with primary repair of arteriotomy 3.  Endovascular aortic aneurysm repair with Gore conformable excluder main body right 36 x 14 x 14 extended with 12 x 10 cm  device and contralateral limb initially 20 x 14 extended with 16 x 16 cm limb.  36 x 4.5 cm proximal calf 4.  Placement of Endo anchors 1 Day Post-Op   Right groin with hematoma. Stable and soft. Will continue to monitor Left groin access site without swelling or hematoma BLE well perfused and warm with palpable DP Okay to mobilize Aspirin  and Statin Plan for d/c tomorrow if he continues to progress well  Teretha Damme, PA-C [facility] [phone] 04/13/2024 9:12 AM  VASCULAR STAFF ADDENDUM: I have independently interviewed and examined the patient. I agree with the above.  Some hypotension yesterday resolved.  Doing well this morning.  Out of bed.  Likely home tomorrow.  Fonda FORBES Rim MD [facility] Phone Number: [phone] 04/13/2024 9:45 AM

## 2024-04-13 NOTE — Progress Notes (Signed)
 Mobility Specialist Progress Note;   04/13/24 1310  Mobility  Activity Ambulated with assistance in hallway  Level of Assistance Contact guard assist, steadying assist  Assistive Device None  Distance Ambulated (ft) 180 ft  Activity Response Tolerated well  Mobility Referral Yes  Mobility visit 1 Mobility  Mobility Specialist Start Time (ACUTE ONLY) 1310  Mobility Specialist Stop Time (ACUTE ONLY) 1326  Mobility Specialist Time Calculation (min) (ACUTE ONLY) 16 min   RN requesting pt to ambulate. On 2LO2 upon arrival (baseline). Required light MinG asisstance duirng ambulation for safety. Required up to 4LO2 during ambulation to maintain SPO2 92%>. HR up to 124 bpm w/ activity. Deferred sitting in chair at [facility] d/t it being uncomfortable. Pt left sitting on EoB with all needs met, call bell in reach. RN notified.   Lauraine Erm Mobility Specialist Please contact via SecureChat or [employer] [phone]

## 2024-04-13 NOTE — Progress Notes (Signed)
 Pt ambulated independently in the hall with 4 liters nasal cannula, with 2 small rest breaks, pt tolerated fair

## 2024-04-13 NOTE — Progress Notes (Signed)
 Nurse requested Mobility Specialist to perform oxygen  saturation test with pt which includes removing pt from oxygen  both at rest and while ambulating.  Below are the results from that testing.     Patient Saturations on Room Air at Rest = spO2 91%  Patient Saturations on Room Air while Ambulating = sp02 86% .    Patient Saturations on 4 Liters of oxygen  while Ambulating = sp02 92%  At end of testing pt left in room on 2  Liters of oxygen .  Reported results to nurse.   Lauraine Erm Mobility Specialist Please contact via SecureChat or [employer] [phone]

## 2024-04-14 LAB — BASIC METABOLIC PANEL WITH GFR
Anion gap: 9 (ref 5–15)
BUN: 13 mg/dL (ref 8–23)
CO2: 24 mmol/L (ref 22–32)
Calcium: 9.3 mg/dL (ref 8.9–10.3)
Chloride: 107 mmol/L (ref 98–111)
Creatinine, Ser: 0.81 mg/dL (ref 0.61–1.24)
GFR, Estimated: >60 mL/min (ref >60)
Glucose, Bld: 103 mg/dL — ABNORMAL HIGH (ref 70–99)
Potassium: 3.4 mmol/L — ABNORMAL LOW (ref 3.5–5.1)
Sodium: 140 mmol/L (ref 135–145)

## 2024-04-14 MED ORDER — TRAMADOL HCL 50 MG PO TABS: 50.0000 mg | ORAL_TABLET | Freq: Four times a day (QID) | ORAL | 0 refills | Status: DC | PRN

## 2024-04-14 MED ORDER — ATORVASTATIN CALCIUM 20 MG PO TABS: 20.0000 mg | ORAL_TABLET | Freq: Every day | ORAL | 3 refills | Status: DC

## 2024-04-14 NOTE — Progress Notes (Signed)
 O2 sats on room air 88-90 % primary RN made aware.

## 2024-04-14 NOTE — Progress Notes (Addendum)
  Progress Note    04/14/2024 7:26 AM 2 Days Post-Op  Subjective: no complaints. mobilized yesterday without issues. Some decreased 02 sats. Asking for his nebulizer this morning.    Vitals:   04/13/24 2319 04/14/24 0325  BP: 110/77 128/79  Pulse: 96 95  Resp: 20 20  Temp: 99.1 F (37.3 C) 99.2 F (37.3 C)  SpO2: 91% 91%   Physical Exam: Cardiac:  regular Lungs:  non labored, 2L [place] Incisions:  right groin staples intact, soft without swelling or hematoma Extremities:  BLE well perfused and warm with palpable DP pulses Abdomen:  soft Neurologic: alert and oriented  CBC    Component Value Date/Time   WBC 6.9 04/13/2024 0310   RBC 3.30 (L) 04/13/2024 0310   HGB 8.8 (L) 04/13/2024 0310   HCT 28.4 (L) 04/13/2024 0310   PLT 179 04/13/2024 0310   MCV 86.1 04/13/2024 0310   MCH 26.7 04/13/2024 0310   MCHC 31.0 04/13/2024 0310   RDW 15.9 (H) 04/13/2024 0310   LYMPHSABS 0.8 04/06/2023 1129   MONOABS 0.7 04/06/2023 1129   EOSABS 0.2 04/06/2023 1129   BASOSABS 0.1 04/06/2023 1129    BMET    Component Value Date/Time   NA 140 04/14/2024 0348   K 3.4 (L) 04/14/2024 0348   CL 107 04/14/2024 0348   CO2 24 04/14/2024 0348   GLUCOSE 103 (H) 04/14/2024 0348   BUN 13 04/14/2024 0348   CREATININE 0.81 04/14/2024 0348   CALCIUM  9.3 04/14/2024 0348   GFRNONAA >60 04/14/2024 0348   GFRAA >60 06/14/2017 0705    INR    Component Value Date/Time   INR 1.4 (H) 04/12/2024 1800     Intake/Output Summary (Last 24 hours) at 04/14/2024 0726 Last data filed at 04/13/2024 2000 Gross per 24 hour  Intake 120 ml  Output 150 ml  Net -30 ml     Assessment/Plan:  73 y.o. male is s/p EVAR with right groin cutdown   Percutaneous ultrasound-guided cannulation and closure bilateral common femoral arteries 2.  Cutdown right common femoral artery with primary repair of arteriotomy 3.  Endovascular aortic aneurysm repair with Gore conformable excluder main body right 36 x 14 x 14  extended with 12 x 10 cm device and contralateral limb initially 20 x 14 extended with 16 x 16 cm limb.  36 x 4.5 cm proximal calf 4.  Placement of Endo anchors  2 Days Post-Op   Right groin incision staples intact, soft, without swelling or hematoma Left groin access site clean, dry intact. Soft without swelling or hematoma VSS Hemodynamically stable On 2L home O2 at baseline. Will get nebulizer treatment this morning prior to d/c Mobilize as tolerated Continue Aspirin  and statin Stable for discharge home today He will follow up in 3 weeks with CTA abd/pelvis   Teretha Damme, PA-C [facility] [phone] 04/14/2024 7:26 AM  VASCULAR STAFF ADDENDUM: I have independently interviewed and examined the patient. I agree with the above.    Fonda FORBES Rim MD [facility] [place] Phone Number: [phone] 04/14/2024 9:09 AM

## 2024-04-15 ENCOUNTER — Telehealth: Payer: Self-pay

## 2024-04-15 ENCOUNTER — Encounter (HOSPITAL_COMMUNITY): Payer: Self-pay | Admitting: Vascular Surgery

## 2024-04-15 DIAGNOSIS — Z7951 Long term (current) use of inhaled steroids: Secondary | ICD-10-CM | POA: Diagnosis not present

## 2024-04-15 DIAGNOSIS — F17210 Nicotine dependence, cigarettes, uncomplicated: Secondary | ICD-10-CM | POA: Diagnosis not present

## 2024-04-15 DIAGNOSIS — I10 Essential (primary) hypertension: Secondary | ICD-10-CM | POA: Diagnosis not present

## 2024-04-15 DIAGNOSIS — I739 Peripheral vascular disease, unspecified: Secondary | ICD-10-CM | POA: Diagnosis not present

## 2024-04-15 DIAGNOSIS — E785 Hyperlipidemia, unspecified: Secondary | ICD-10-CM | POA: Diagnosis not present

## 2024-04-15 DIAGNOSIS — Z993 Dependence on wheelchair: Secondary | ICD-10-CM | POA: Diagnosis not present

## 2024-04-15 DIAGNOSIS — Z9981 Dependence on supplemental oxygen: Secondary | ICD-10-CM | POA: Diagnosis not present

## 2024-04-15 DIAGNOSIS — Z7982 Long term (current) use of aspirin: Secondary | ICD-10-CM | POA: Diagnosis not present

## 2024-04-15 DIAGNOSIS — J449 Chronic obstructive pulmonary disease, unspecified: Secondary | ICD-10-CM | POA: Diagnosis not present

## 2024-04-15 DIAGNOSIS — K5090 Crohn's disease, unspecified, without complications: Secondary | ICD-10-CM | POA: Diagnosis not present

## 2024-04-15 DIAGNOSIS — Z48812 Encounter for surgical aftercare following surgery on the circulatory system: Secondary | ICD-10-CM | POA: Diagnosis not present

## 2024-04-15 LAB — BPAM RBC
Blood Product Unit Number: 202508122359
ISSUE DATE / TIME: 202507181407
ISSUE DATE / TIME: 202507191541
ISSUE DATE / TIME: 202508122359
PRODUCT CODE: 202508122359
Unit Type and Rh: 202507191500
Unit Type and Rh: 202508122359
Unit Type and Rh: 202508122359
Unit Type and Rh: 5100
Unit Type and Rh: 5100
Unit Type and Rh: 5100

## 2024-04-15 LAB — TYPE AND SCREEN
ABO/RH(D): O POS
Antibody Screen: NEGATIVE
Unit division: 0
Unit division: 0
Unit division: 0

## 2024-04-15 NOTE — Patient Instructions (Signed)
 Visit Information  Thank you for taking time to visit with me today. Please don't hesitate to contact me if I can be of assistance to you before our next scheduled telephone appointment.  Our next appointment is by telephone on Monday July 28th at 10:30am   Following is a copy of your care plan:   Goals Addressed             This Visit's Progress    VBCI Transitions of Care (TOC) Care Plan       Problems:  Recent Hospitalization for treatment of COPD No Hospital Follow Up Provider appointment The patient declines a hospital follow up provider appointment  Goal:  Over the next 30 days, the patient will not experience hospital readmission  Interventions:   COPD Interventions: Advised patient to track and manage COPD triggers Discussed the importance of adequate rest and management of fatigue with COPD Provided instruction about proper use of medications used for management of COPD including inhalers Screening for signs and symptoms of depression related to chronic disease state  Use of home oxygen  Smoking cessation  Oxygen  at 2l/min continuous Nebulizer treatments and Albuterol  inhaler as needed Manage energy conservation - utilize electric scooter when leaving the apartment  Patient Self Care Activities:  Attend all scheduled provider appointments Call pharmacy for medication refills 3-7 days in advance of running out of medications Call provider office for new concerns or questions  Notify RN Care Manager of [facility] call rescheduling needs Participate in Transition of Care Program/Attend [facility] scheduled calls Perform all self care activities independently  Take medications as prescribed    Plan:  Telephone follow up appointment with care management team member scheduled for:  Monday July 28th at 10:30am         Patient verbalizes understanding of instructions and care plan provided today and agrees to view in MyChart. Active MyChart status and patient understanding of how  to access instructions and care plan via MyChart confirmed with patient.     The patient has been provided with contact information for the care management team and has been advised to call with any health related questions or concerns.   Please call the care guide team at [phone] if you need to cancel or reschedule your appointment.   Please call the Suicide and Crisis Lifeline: 988 call the [place]  National Suicide Prevention Lifeline: 313 461 0822 or TTY: (206)518-4814 TTY 281-503-2282) to talk to a trained counselor if you are experiencing a Mental Health or Behavioral Health Crisis or need someone to talk to.  Medford Balboa, BSN, RN [place]  VBCI - [employer] [facility] RN Care Manager [phone]

## 2024-04-15 NOTE — Telephone Encounter (Signed)
 Called [facility] to provide verbal rx for missing information. Spoke with Donnalyn, CPhT. Rep advised of need by date. They advised that they do not need anything for tubing. NFN  Phone: [phone]  Sherry Pennant, PharmD, MPH, BCPS, CPP Clinical Pharmacist (Rheumatology and Pulmonology)

## 2024-04-15 NOTE — Telephone Encounter (Signed)
 Pt called [facility] reporting that his incision dressings were coming off.  Called patient to advise to remove old dressings and replace with new, clean dressings.  Pt reported that the [facility] nurse is going to visit him later today.

## 2024-04-15 NOTE — Transitions of Care (Post Inpatient/ED Visit) (Signed)
 04/15/2024  Name: Martin Santiago MRN: [id number] DOB: 08-Mar-1951  Today's TOC FU Call Status: Today's TOC FU Call Status:: Successful TOC FU Call Completed TOC FU Call Complete Date: 04/15/24 Patient's Name and Date of Birth confirmed.  Transition Care Management Follow-up Telephone Call Date of Discharge: 04/14/24 Discharge Facility: [facility]) Type of Discharge: Inpatient Admission Primary Inpatient Discharge Diagnosis:: AAA Repair How have you been since you were released from the hospital?: Better Any questions or concerns?: Yes Patient Questions/Concerns:: My bandage on the incision site is coming off Patient Questions/Concerns Addressed: Other: [facility] has a scheduled visit to see the patient today to assess the wound)  Items Reviewed: Did you receive and understand the discharge instructions provided?: Yes Medications obtained,verified, and reconciled?: Yes (Medications Reviewed) Any new allergies since your discharge?: No Dietary orders reviewed?: Yes Type of Diet Ordered:: Heart healthy Do you have support at home?: Yes People in Home [RPT]: facility resident Name of Support/Comfort Primary Source: The patient lives in an ILF  Medications Reviewed Today: Medications Reviewed Today     Reviewed by [name], RN (Case Manager) on 04/15/24 at 1407  Med List Status: <None>   Medication Order Taking? Sig Documenting Provider Last Dose Status Informant  albuterol  (PROVENTIL ) (2.5 MG/3ML) 0.083% nebulizer solution [id number]  Take 3 mLs (2.5 mg total) by nebulization every 4 (four) hours.  Patient taking differently: Take 2.5 mg by nebulization every 4 (four) hours as needed for wheezing or shortness of breath (COPD).   Kara Dorn NOVAK, MD  Active Self  albuterol  (VENTOLIN  HFA) 108 [phone] Base) MCG/ACT inhaler [id number]  Inhale 2 puffs into the lungs every 6 (six) hours as needed for wheezing or shortness of breath. Kara Dorn NOVAK, MD  Active Self  amLODipine   (NORVASC ) 10 MG tablet [id number]  Take 1 tablet (10 mg total) by mouth daily. Needs appt Dorina Dallas RIGGERS  Active Self  aspirin  EC 81 MG tablet [id number]  Take 1 tablet (81 mg total) by mouth daily. Swallow whole. Krasowski, Robert J, MD  Active Self  atorvastatin  (LIPITOR) 20 MG tablet [id number]  Take 1 tablet (20 mg total) by mouth daily. Baglia, Corrina, PA-C  Active   azithromycin  (ZITHROMAX ) 250 MG tablet [id number]  Take 1 tablet (250 mg total) by mouth daily. Kara Dorn NOVAK, MD  Active Self  BREZTRI  AEROSPHERE 160-9-4.8 MCG/ACT TERESE [id number]  INHALE 2 PUFFS INTO THE LUNGS IN THE MORNING AND AT BEDTIME Kara Dorn NOVAK, MD  Active Self  Ensifentrine  (OHTUVAYRE ) 3 MG/2.5ML SUSP [id number]  Inhale 3 mg into the lungs in the morning and at bedtime. Kara Dorn NOVAK, MD  Active Self           Med Note CHRISTIE ALEXANDER   Thu Apr 11, 2024  9:51 AM) Has not started  losartan  (COZAAR ) 100 MG tablet [id number]  Take 1 tablet (100 mg total) by mouth daily. Needs appt Saguier, Dallas, PA-C  Active Self  nicotine  (NICODERM CQ  - DOSED IN MG/24 HOURS) 21 mg/24hr patch [id number]  Place 21 mg onto the skin daily. [provider]  Active Self  omeprazole  (PRILOSEC) 20 MG capsule [id number]  TAKE 1 CAPSULE(20 MG) BY MOUTH DAILY Saguier, Edward, PA-C  Active Self  OXYGEN  [id number] Yes Inhale 2 L/min into the lungs continuous. [provider]  Active   sildenafil  (REVATIO ) 20 MG tablet [id number]  3-5 tab prior to sex.(Max use once in 24 hour period) Saguier, Dallas, PA-C  Active Self  tamsulosin  (FLOMAX ) 0.4  MG CAPS capsule [id number]  Take 0.4 mg by mouth in the morning. [provider]  Active Self  timolol (TIMOPTIC) 0.5 % ophthalmic solution [id number]  Place 1 drop into the right eye daily. [provider]  Active Self  traMADol  (ULTRAM ) 50 MG tablet [id number]  Take 1 tablet (50 mg total) by mouth every 6 (six) hours as needed for severe pain (pain score 7-10).   Patient not taking: Reported on 04/15/2024   Charlyne Reed, PA-C  Active             Home Care and Equipment/Supplies: Were Home Health Services Ordered?: Yes Name of Home Health Agency:: [facility] Has Agency set up a time to come to your home?: Yes First Home Health Visit Date: 04/15/24 Any new equipment or medical supplies ordered?: No  Functional Questionnaire: Do you need assistance with bathing/showering or dressing?: No Do you need assistance with meal preparation?: Yes (His meals are prepared by the facility) Do you need assistance with eating?: No Do you have difficulty maintaining continence: No Do you need assistance with getting out of bed/getting out of a chair/moving?: No Do you have difficulty managing or taking your medications?: No  Follow up appointments reviewed: PCP Follow-up appointment confirmed?: No (The patient declined PCP follow up today.) MD Provider Line Number:[phone] Given: No Specialist Hospital Follow-up appointment confirmed?: Yes Date of Specialist follow-up appointment?: 05/15/24 Follow-Up Specialty Provider:: [place] Do you need transportation to your follow-up appointment?: No Do you understand care options if your condition(s) worsen?: Yes-patient verbalized understanding  SDOH Interventions Today    Flowsheet Row Most Recent Value  SDOH Interventions   Food Insecurity Interventions Intervention Not Indicated  Housing Interventions Intervention Not Indicated  Transportation Interventions Intervention Not Indicated  Utilities Interventions Intervention Not Indicated    Goals Addressed             This Visit's Progress    VBCI Transitions of Care (TOC) Care Plan       Problems:  Recent Hospitalization for treatment of COPD No Hospital Follow Up Provider appointment The patient declines a hospital follow up provider appointment  Goal:  Over the next 30 days, the patient will not experience hospital  readmission  Interventions:   COPD Interventions: Advised patient to track and manage COPD triggers Discussed the importance of adequate rest and management of fatigue with COPD Provided instruction about proper use of medications used for management of COPD including inhalers Screening for signs and symptoms of depression related to chronic disease state  Use of home oxygen  Smoking cessation  Oxygen  at 2l/min continuous Nebulizer treatments and Albuterol  inhaler as needed Manage energy conservation - utilize electric scooter when leaving the apartment  Patient Self Care Activities:  Attend all scheduled provider appointments Call pharmacy for medication refills 3-7 days in advance of running out of medications Call provider office for new concerns or questions  Notify RN Care Manager of [facility] call rescheduling needs Participate in Transition of Care Program/Attend [facility] scheduled calls Perform all self care activities independently  Take medications as prescribed    Plan:  Telephone follow up appointment with care management team member scheduled for:  Monday July 28th at 10:30am         Medford Balboa, BSN, RN [place]  VBCI - Population Health RN Care Manager [phone]

## 2024-04-16 ENCOUNTER — Other Ambulatory Visit: Payer: Self-pay

## 2024-04-16 DIAGNOSIS — I7143 Infrarenal abdominal aortic aneurysm, without rupture: Secondary | ICD-10-CM

## 2024-04-17 ENCOUNTER — Telehealth: Payer: Self-pay

## 2024-04-17 DIAGNOSIS — Z48812 Encounter for surgical aftercare following surgery on the circulatory system: Secondary | ICD-10-CM | POA: Diagnosis not present

## 2024-04-17 DIAGNOSIS — I10 Essential (primary) hypertension: Secondary | ICD-10-CM | POA: Diagnosis not present

## 2024-04-17 DIAGNOSIS — J449 Chronic obstructive pulmonary disease, unspecified: Secondary | ICD-10-CM | POA: Diagnosis not present

## 2024-04-17 DIAGNOSIS — E785 Hyperlipidemia, unspecified: Secondary | ICD-10-CM | POA: Diagnosis not present

## 2024-04-17 DIAGNOSIS — K5090 Crohn's disease, unspecified, without complications: Secondary | ICD-10-CM | POA: Diagnosis not present

## 2024-04-17 DIAGNOSIS — I739 Peripheral vascular disease, unspecified: Secondary | ICD-10-CM | POA: Diagnosis not present

## 2024-04-17 NOTE — Telephone Encounter (Signed)
 Camie, RN [facility] called to report patient's stitches in the middle of his right groin incision are coming out.  Camie reports the incision looks great, scant serosanguinous discharge, no signs of infection. Camie will continue to monitor the patient and his incision and will call back if anything needed.

## 2024-04-18 ENCOUNTER — Telehealth: Payer: Self-pay

## 2024-04-18 DIAGNOSIS — I10 Essential (primary) hypertension: Secondary | ICD-10-CM | POA: Diagnosis not present

## 2024-04-18 DIAGNOSIS — J449 Chronic obstructive pulmonary disease, unspecified: Secondary | ICD-10-CM | POA: Diagnosis not present

## 2024-04-18 DIAGNOSIS — Z48812 Encounter for surgical aftercare following surgery on the circulatory system: Secondary | ICD-10-CM | POA: Diagnosis not present

## 2024-04-18 DIAGNOSIS — E785 Hyperlipidemia, unspecified: Secondary | ICD-10-CM | POA: Diagnosis not present

## 2024-04-18 DIAGNOSIS — I739 Peripheral vascular disease, unspecified: Secondary | ICD-10-CM | POA: Diagnosis not present

## 2024-04-18 DIAGNOSIS — K5090 Crohn's disease, unspecified, without complications: Secondary | ICD-10-CM | POA: Diagnosis not present

## 2024-04-18 NOTE — Telephone Encounter (Signed)
 Wanda, PT [facility] called to get verbal for pt's PT  Verbal order given: 1x/wk for 8 weeks to work on gait training, balance, home exercise program and fall prevention.

## 2024-04-19 DIAGNOSIS — K5090 Crohn's disease, unspecified, without complications: Secondary | ICD-10-CM | POA: Diagnosis not present

## 2024-04-19 DIAGNOSIS — J449 Chronic obstructive pulmonary disease, unspecified: Secondary | ICD-10-CM | POA: Diagnosis not present

## 2024-04-19 DIAGNOSIS — E785 Hyperlipidemia, unspecified: Secondary | ICD-10-CM | POA: Diagnosis not present

## 2024-04-19 DIAGNOSIS — I10 Essential (primary) hypertension: Secondary | ICD-10-CM | POA: Diagnosis not present

## 2024-04-19 DIAGNOSIS — I739 Peripheral vascular disease, unspecified: Secondary | ICD-10-CM | POA: Diagnosis not present

## 2024-04-19 DIAGNOSIS — Z48812 Encounter for surgical aftercare following surgery on the circulatory system: Secondary | ICD-10-CM | POA: Diagnosis not present

## 2024-04-19 NOTE — Discharge Summary (Signed)
 [facility] Discharge Summary   Martin Santiago 21-Nov-1950 73 y.o. male  MRN: [id number]  Admission Date: 04/12/2024  Discharge Date: 04/14/2024  Physician: Dr. [name]  Admission Diagnosis: Infrarenal abdominal aortic aneurysm (AAA) without rupture (HCC) [I71.43] S/P endovascular aneurysm repair [S01.109, Z86.79] Abdominal aortic aneurysm (AAA) greater than 5.5 cm in diameter in male [facility]) [I71.40]  Hospital Course:  The patient was admitted to the hospital and taken to the operating room on 04/12/2024 and underwent:1.  Percutaneous ultrasound-guided cannulation and closure bilateral common femoral arteries 2.  Cutdown right common femoral artery with primary repair of arteriotomy 3.  Endovascular aortic aneurysm repair with Gore conformable excluder main body right 36 x 14 x 14 extended with 12 x 10 cm device and contralateral limb initially 20 x 14 extended with 16 x 16 cm limb.  36 x 4.5 cm proximal calf  4.  Placement of Endo anchors by Dr. Colorado and Dr. Serene.    The pt tolerated the procedure well and was transported to the PACU in good condition.   Later in the evening on the day of surgery patient was noted to have bleeding from his left groin access site. He became hypotensive and complained to nursing staff of feeling hot and dizzy. Order for 75cc of Normal Saline and manual pressure to be applied to left groin. Dr. Lanis was called and arrived at bedside to evaluate patient. Patient was stabilized with manual pressure on left groin and fluid bolus. No further issues overnight.   By POD#1, his bilateral groin sites remained stable. Right groin with small hematoma but soft, left groin site also without further swelling or hematoma. Bilateral lower extremities well perfused and warm with palpable DP pulses. Hemodynamically stable. Vitals stable. Cleared to mobilize. Tolerated well with mobility specialist. Some increased O2 requirements from baseline of 2L. Otherwise tolerating  ambulating well.   The remainder of the hospital course consisted of increasing mobilization and increasing intake of solids without difficulty.  POD#2  No further concerns for expansion of groin hematomas. Incision on right groin well appearing, and left access site soft and dry. Lower extremities well perfused and warm with doppler signals. Some increased O2 requirements still. Able to tolerate weaning off throughout day and improved after nebulizer treatment. Hemodynamically stable. Continued to mobilize without issues. He remained stable for discharge home. PDMP was reviewed and post operative pain medication was sent to his requested pharmacy. He will otherwise resume all home medications as prescribed including Aspirin  and statin. He will follow up in our office in 3 weeks for right groin staple removal and CTA abdomen and pelvis.   CBC    Component Value Date/Time   WBC 6.9 04/13/2024 0310   RBC 3.30 (L) 04/13/2024 0310   HGB 8.8 (L) 04/13/2024 0310   HCT 28.4 (L) 04/13/2024 0310   PLT 179 04/13/2024 0310   MCV 86.1 04/13/2024 0310   MCH 26.7 04/13/2024 0310   MCHC 31.0 04/13/2024 0310   RDW 15.9 (H) 04/13/2024 0310   LYMPHSABS 0.8 04/06/2023 1129   MONOABS 0.7 04/06/2023 1129   EOSABS 0.2 04/06/2023 1129   BASOSABS 0.1 04/06/2023 1129    BMET    Component Value Date/Time   NA 140 04/14/2024 0348   K 3.4 (L) 04/14/2024 0348   CL 107 04/14/2024 0348   CO2 24 04/14/2024 0348   GLUCOSE 103 (H) 04/14/2024 0348   BUN 13 04/14/2024 0348   CREATININE 0.81 04/14/2024 0348   CALCIUM  9.3 04/14/2024 0348  GFRNONAA >60 04/14/2024 0348   GFRAA >60 06/14/2017 0705       Discharge Instructions     ABDOMINAL PROCEDURE/ANEURYSM REPAIR/AORTO-BIFEMORAL BYPASS:  Call MD for increased abdominal pain; cramping diarrhea; nausea/vomiting   Complete by: As directed    Call MD for:  redness, tenderness, or signs of infection (pain, swelling, bleeding, redness, odor or green/yellow  discharge around incision site)   Complete by: As directed    Call MD for:  severe or increased pain, loss or decreased feeling  in affected limb(s)   Complete by: As directed    Call MD for:  temperature >100.5   Complete by: As directed    Discharge wound care:   Complete by: As directed    Okay to wash incision with mild soap and water , pat dry. Do not soak in bathtub, pool, etc   Driving Restrictions   Complete by: As directed    No driving for 1 week or while taking narcotic pain medication   Increase activity slowly   Complete by: As directed    Walk with assistance use walker or cane as needed   Lifting restrictions   Complete by: As directed    No lifting for 6 weeks   Resume previous diet   Complete by: As directed        Discharge Diagnosis:  Infrarenal abdominal aortic aneurysm (AAA) without rupture (HCC) [I71.43] S/P endovascular aneurysm repair [S01.109, Z86.79] Abdominal aortic aneurysm (AAA) greater than 5.5 cm in diameter in male [facility]) [I71.40]  Secondary Diagnosis: Patient Active Problem List   Diagnosis Date Noted   S/P endovascular aneurysm repair 04/12/2024   Abdominal aortic aneurysm (AAA) greater than 5.5 cm in diameter in male (HCC) 04/12/2024   Closed fracture of first cervical vertebra (HCC) 08/02/2023   Acquired kyphosis 08/02/2023   Coronary artery calcification 05/12/2023   Dyslipidemia 05/12/2023   Retinal detachment, right 02/12/2021   Nontraumatic rupture of right proximal biceps tendon 11/16/2020   Lattice degeneration of right retina 10/29/2020   Pseudophakia of right eye 10/29/2020   Combined form of age-related cataract, right eye 06/07/2018   Acute respiratory failure with hypoxia (HCC) 06/14/2017   Tobacco abuse 06/14/2017   HTN (hypertension) 06/14/2017   COPD (chronic obstructive pulmonary disease) (HCC) 06/13/2017   Secondary open-angle glaucoma of left eye, severe stage 01/07/2016   Hx of colonic polyps 11/19/2012   Aphakia,  left 10/27/2011   Nuclear cataract 10/27/2011   Retinal detachment of left eye with multiple breaks 10/27/2011   Past Medical History:  Diagnosis Date   AAA (abdominal aortic aneurysm) (HCC)    9.8 cm 04/10/24 CTA   Closed C1 fracture (HCC) 05/28/2023   Colon polyps    COPD (chronic obstructive pulmonary disease) (HCC)    Crohn's disease (HCC)    Detached retina    Essential hypertension    Tropical sprue      Allergies as of 04/14/2024       Reactions   Codeine Nausea And Vomiting, Other (See Comments)   Other reaction(s): Flushing (ALLERGY/intolerance) fainting        Medication List     TAKE these medications    albuterol  (2.5 MG/3ML) 0.083% nebulizer solution Commonly known as: PROVENTIL  Take 3 mLs (2.5 mg total) by nebulization every 4 (four) hours. What changed:  when to take this reasons to take this   albuterol  108 (90 Base) MCG/ACT inhaler Commonly known as: Ventolin  HFA Inhale 2 puffs into the lungs every 6 (six) hours  as needed for wheezing or shortness of breath. What changed: Another medication with the same name was changed. Make sure you understand how and when to take each.   amLODipine  10 MG tablet Commonly known as: NORVASC  Take 1 tablet (10 mg total) by mouth daily. Needs appt   aspirin  EC 81 MG tablet Take 1 tablet (81 mg total) by mouth daily. Swallow whole.   atorvastatin  20 MG tablet Commonly known as: LIPITOR Take 1 tablet (20 mg total) by mouth daily.   azithromycin  250 MG tablet Commonly known as: ZITHROMAX  Take 1 tablet (250 mg total) by mouth daily.   Breztri  Aerosphere 160-9-4.8 MCG/ACT Aero inhaler Generic drug: budesonide -glycopyrrolate -formoterol  INHALE 2 PUFFS INTO THE LUNGS IN THE MORNING AND AT BEDTIME   losartan  100 MG tablet Commonly known as: COZAAR  Take 1 tablet (100 mg total) by mouth daily. Needs appt   nicotine  21 mg/24hr patch Commonly known as: NICODERM CQ  - dosed in mg/24 hours Place 21 mg onto the skin  daily.   Ohtuvayre  3 MG/2.5ML Susp Generic drug: Ensifentrine  Inhale 3 mg into the lungs in the morning and at bedtime.   omeprazole  20 MG capsule Commonly known as: PRILOSEC TAKE 1 CAPSULE(20 MG) BY MOUTH DAILY   sildenafil  20 MG tablet Commonly known as: REVATIO  3-5 tab prior to sex.(Max use once in 24 hour period)   tamsulosin  0.4 MG Caps capsule Commonly known as: FLOMAX  Take 0.4 mg by mouth in the morning.   timolol 0.5 % ophthalmic solution Commonly known as: TIMOPTIC Place 1 drop into the right eye daily.   traMADol  50 MG tablet Commonly known as: ULTRAM  Take 1 tablet (50 mg total) by mouth every 6 (six) hours as needed for severe pain (pain score 7-10).               Discharge Care Instructions  (From admission, onward)           Start     Ordered   04/14/24 0000  Discharge wound care:       Comments: Okay to wash incision with mild soap and water , pat dry. Do not soak in bathtub, pool, etc   04/14/24 0855            Discharge Instructions:   [facility]  Discharge Instructions Endovascular Aortic Aneurysm Repair  Please refer to the following instructions for your post-procedure care. Your surgeon or Physician Assistant will discuss any changes with you.  Activity  You are encouraged to walk as much as you can. You can slowly return to normal activities but must avoid strenuous activity and heavy lifting until your doctor tells you it's OK. Avoid activities such as vacuuming or swinging a gold club. It is normal to feel tired for several weeks after your surgery. Do not drive until your doctor gives the OK and you are no longer taking prescription pain medications. It is also normal to have difficulty with sleep habits, eating, and bowel movements after surgery. These will go away with time.  Bathing/Showering  You may shower after you go home. If you have an incision, do not soak in a bathtub, hot tub, or swim  until the incision heals completely.  Incision Care  Shower every day. Clean your incision with mild soap and water . Pat the area dry with a clean towel. You do not need a bandage unless otherwise instructed. Do not apply any ointments or creams to your incision. If you clothing is irritating, you  may cover your incision with a dry gauze pad.  Diet  Resume your normal diet. There are no special food restrictions following this procedure. A low fat/low cholesterol diet is recommended for all patients with vascular disease. In order to heal from your surgery, it is CRITICAL to get adequate nutrition. Your body requires vitamins, minerals, and protein. Vegetables are the best source of vitamins and minerals. Vegetables also provide the perfect balance of protein. Processed food has little nutritional value, so try to avoid this.  Medications  Resume taking all of your medications unless your doctor or Physician Assistnat tells you not to. If your incision is causing pain, you may take over-the-counter pain relievers such as acetaminophen  (Tylenol ). If you were prescribed a stronger pain medication, please be aware these medications can cause nausea and constipation. Prevent nausea by taking the medication with a snack or meal. Avoid constipation by drinking plenty of fluids and eating foods with a high amount of fiber, such as fruits, vegetables, and grains. Do not take Tylenol  if you are taking prescription pain medications.   Follow up  Our office will schedule a follow-up appointment with a C.T. scan 3-4 weeks after your surgery.  Please call us  immediately for any of the following conditions  Severe or worsening pain in your legs or feet or in your abdomen back or chest. Increased pain, redness, drainage (pus) from your incision sit. Increased abdominal pain, bloating, nausea, vomiting or persistent diarrhea. Fever of 101 degrees or higher. Swelling in your leg (s),  Reduce your risk of  vascular disease  Stop smoking. If you would like help call [facility] at 1-800-QUIT-NOW ((765)471-0835) or [place] at (951)764-1068. Manage your cholesterol Maintain a desired weight Control your diabetes Keep your blood pressure down  If you have questions, please call the office at [phone].    Prescriptions given: Tramadol  #20 No Refill  Disposition: Home  Patient's condition: is Good  Follow up: 1. Dr. Sheree in 3-4 weeks with CTA protocol   Teretha Damme, PA-C [facility] [phone] 04/19/2024  8:22 AM   - For VQI Registry use - Post-op:  Time to Extubation: [X]  In OR, [ ]  < 12 hrs, [ ]  12-24 hrs, [ ]  >=24 hrs Vasopressors Req. Post-op: No MI: No., [ ]  Troponin only, [ ]  EKG or Clinical New Arrhythmia: No CHF: No ICU Stay: 0 days in ICU Transfusion: No     If yes, 0 units given  Complications: Resp failure: No., [ ]  Pneumonia, [ ]  Ventilator Chg in renal function: No., [ ]  Inc. Cr > 0.5, [ ]  Temp. Dialysis,  [ ]  Permanent dialysis Leg ischemia: No., no Surgery needed, [ ]  Yes, Surgery needed,  [ ]  Amputation Bowel ischemia: No., [ ]  Medical Rx, [ ]  Surgical Rx Wound complication: No., [ ]  Superficial separation/infection, [ ]  Return to OR Return to OR: No  Return to OR for bleeding: No Stroke: No., [ ]  Minor, [ ]  Major  Discharge medications: Statin use:  Yes  ASA use:  Yes  Plavix use:  No  Beta blocker use:  No  ARB use:  Yes ACEI use:  No CCB use:  Yes

## 2024-04-22 ENCOUNTER — Telehealth: Payer: Self-pay | Admitting: *Deleted

## 2024-04-22 ENCOUNTER — Other Ambulatory Visit: Payer: Self-pay

## 2024-04-22 DIAGNOSIS — E785 Hyperlipidemia, unspecified: Secondary | ICD-10-CM | POA: Diagnosis not present

## 2024-04-22 DIAGNOSIS — J449 Chronic obstructive pulmonary disease, unspecified: Secondary | ICD-10-CM | POA: Diagnosis not present

## 2024-04-22 DIAGNOSIS — I739 Peripheral vascular disease, unspecified: Secondary | ICD-10-CM | POA: Diagnosis not present

## 2024-04-22 DIAGNOSIS — I10 Essential (primary) hypertension: Secondary | ICD-10-CM | POA: Diagnosis not present

## 2024-04-22 DIAGNOSIS — K5090 Crohn's disease, unspecified, without complications: Secondary | ICD-10-CM | POA: Diagnosis not present

## 2024-04-22 DIAGNOSIS — Z48812 Encounter for surgical aftercare following surgery on the circulatory system: Secondary | ICD-10-CM | POA: Diagnosis not present

## 2024-04-22 NOTE — Patient Instructions (Signed)
 Visit Information  Thank you for taking time to visit with me today. Please don't hesitate to contact me if I can be of assistance to you before our next scheduled telephone appointment.  Our next appointment is by telephone on Monday August 4th at 11:00am  Following is a copy of your care plan:   Goals Addressed             This Visit's Progress    VBCI Transitions of Care (TOC) Care Plan       Problems: (reviewed 04/22/24) Recent Hospitalization for treatment of COPD No Hospital Follow Up Provider appointment The patient declines a hospital follow up provider appointment  Goal: (reviewed 04/22/24) Over the next 30 days, the patient will not experience hospital readmission  Interventions: (reviewed 04/22/24)  COPD Interventions: Advised patient to track and manage COPD triggers Discussed the importance of adequate rest and management of fatigue with COPD Provided instruction about proper use of medications used for management of COPD including inhalers Screening for signs and symptoms of depression related to chronic disease state  Use of home oxygen  Smoking cessation  Oxygen  at 2l/min continuous Nebulizer treatments and Albuterol  inhaler as needed Manage energy conservation - utilize electric scooter when leaving the apartment Participate weekly in HHPT Energy conservation   POST OP Femoral Groin Care: (reviewed 04/22/24)  - Leave bandage in place until directed by the provider to remove it  - Assess for wound infection - increased drainage from the incision site, redness, edema  - Staple removal as directed by the provider  - Keep the incision clean and dry  - Home Health RN twice a week  Patient Self Care Activities: (reviewed 04/22/24) Attend all scheduled provider appointments Call pharmacy for medication refills 3-7 days in advance of running out of medications Call provider office for new concerns or questions  Notify RN Care Manager of [facility] call rescheduling  needs Participate in Transition of Care Program/Attend [facility] scheduled calls Perform all self care activities independently  Take medications as prescribed    Plan:  Telephone follow up appointment with care management team member scheduled for:  Monday August 4th at 11:00am         Patient verbalizes understanding of instructions and care plan provided today and agrees to view in MyChart. Active MyChart status and patient understanding of how to access instructions and care plan via MyChart confirmed with patient.     The patient has been provided with contact information for the care management team and has been advised to call with any health related questions or concerns.   Please call the care guide team at [phone] if you need to cancel or reschedule your appointment.   Please call the Suicide and Crisis Lifeline: 988 call the [place]  National Suicide Prevention Lifeline: (251)597-3329 or TTY: 501-095-8377 TTY 628 621 2870) to talk to a trained counselor if you are experiencing a Mental Health or Behavioral Health Crisis or need someone to talk to.  Medford Balboa, BSN, RN   VBCI - [facility] RN Care Manager [phone]

## 2024-04-22 NOTE — Telephone Encounter (Signed)
 Camie RN from [facility] called she states patients incision healing well but has some staples that are loose she is requesting an order for staple removal. Patient procedure was done on 04/12/24 and it is too soon for staple removal. Left message for Camie to leave staples in and we will have patient come in for incision check next Wednesday. Called patient but got voice mail left message for patient that we have scheduled him to come in to have an incision check on 05/01/24 arrival time 9:00 am. Instructed patient to call our office if unable to make this appt.

## 2024-04-22 NOTE — Transitions of Care (Post Inpatient/ED Visit) (Signed)
 Transition of Care week 2  Visit Note  04/22/2024  Name: ALEXIS MIZUNO MRN: [id number]          DOB: 10-02-1950  Situation: Patient enrolled in [facility] 30-day program. Visit completed with Sire Bullok by telephone.   Background:   Past Medical History:  Diagnosis Date   AAA (abdominal aortic aneurysm) (HCC)    9.8 cm 04/10/24 CTA   Closed C1 fracture (HCC) 05/28/2023   Colon polyps    COPD (chronic obstructive pulmonary disease) (HCC)    Crohn's disease (HCC)    Detached retina    Essential hypertension    Tropical sprue     Assessment: Patient Reported Symptoms: Cognitive Cognitive Status: Alert and oriented to person, place, and time      Neurological Neurological Review of Symptoms: No symptoms reported    HEENT HEENT Symptoms Reported: No symptoms reported HEENT Comment: No vision in his left eye but he has 20/40 vision in his right eye but chooses not to drive    Cardiovascular Cardiovascular Symptoms Reported: No symptoms reported    Respiratory Respiratory Symptoms Reported: Other: (Fatigue) Additional Respiratory Details: the patient is oxygen  dependent at 2l/min. He continues to smoke a couple of cigarettes a day. He states his respiratory status has left him more fatigued and the intense heat that is outside is also affecting hime Respiratory Management Strategies: Adequate rest, Oxygen  therapy, Routine screening, Medication therapy, Breathing techniques  Endocrine Endocrine Symptoms Reported: No symptoms reported Is patient diabetic?: No    Gastrointestinal Gastrointestinal Symptoms Reported: No symptoms reported      Genitourinary Genitourinary Symptoms Reported: No symptoms reported    Integumentary Integumentary Symptoms Reported: Incision Additional Integumentary Details: His incisions have sutures and staples. There is a small amount of drainage at one staple. [facility] is calling the surgeon to see if the staples can be removed on the next visit. No  pain Skin Management Strategies: Routine screening, Dressing changes Skin Comment: HHRN twice a week  Musculoskeletal Musculoskelatal Symptoms Reviewed: Limited mobility, Difficulty walking Additional Musculoskeletal Details: Limited mobility is related to fatigue due to COPD Musculoskeletal Management Strategies: Adequate rest, Medical device Musculoskeletal Comment: Uses an electric scooter for any distance with ambulation Falls in the past year?: No Number of falls in past year: 1 or less Was there an injury with Fall?: No Fall Risk Category Calculator: 0 Patient Fall Risk Level: Low Fall Risk Patient at Risk for Falls Due to: Impaired mobility Fall risk Follow up: Falls evaluation completed  Psychosocial Psychosocial Symptoms Reported: No symptoms reported         There were no vitals filed for this visit.  Medications Reviewed Today     Reviewed by [name], RN (Case Manager) on 04/22/24 at 1056  Med List Status: <None>   Medication Order Taking? Sig Documenting Provider Last Dose Status Informant  albuterol  (PROVENTIL ) (2.5 MG/3ML) 0.083% nebulizer solution [id number] No Take 3 mLs (2.5 mg total) by nebulization every 4 (four) hours.  Patient taking differently: Take 2.5 mg by nebulization every 4 (four) hours as needed for wheezing or shortness of breath (COPD).   Kara Dorn NOVAK, MD 04/12/2024  9:00 AM Active Self  albuterol  (VENTOLIN  HFA) 108 (90 Base) MCG/ACT inhaler [id number] No Inhale 2 puffs into the lungs every 6 (six) hours as needed for wheezing or shortness of breath. Kara Dorn NOVAK, MD 04/12/2024  9:00 AM Active Self  amLODipine  (NORVASC ) 10 MG tablet [id number] No Take 1 tablet (10 mg total) by  mouth daily. Needs appt Dorina Loving, PA-C 04/12/2024  9:00 AM Active Self  aspirin  EC 81 MG tablet [id number] No Take 1 tablet (81 mg total) by mouth daily. Swallow whole. Krasowski, Robert J, MD 04/12/2024  9:00 AM Active Self  atorvastatin  (LIPITOR) 20 MG  tablet [id number]  Take 1 tablet (20 mg total) by mouth daily. Baglia, Corrina, PA-C  Active   azithromycin  (ZITHROMAX ) 250 MG tablet [id number]  Take 1 tablet (250 mg total) by mouth daily. Kara Dorn NOVAK, MD  Active Self  BREZTRI  AEROSPHERE 160-9-4.8 MCG/ACT AERO [id number] No INHALE 2 PUFFS INTO THE LUNGS IN THE MORNING AND AT BEDTIME Kara Dorn NOVAK, MD 04/12/2024  9:00 AM Active Self  Ensifentrine  (OHTUVAYRE ) 3 MG/2.5ML SUSP [id number]  Inhale 3 mg into the lungs in the morning and at bedtime. Kara Dorn NOVAK, MD  Active Self           Med Note MARGETTE, Milledge Gerding   Mon Apr 22, 2024 10:55 AM)    losartan  (COZAAR ) 100 MG tablet [id number] No Take 1 tablet (100 mg total) by mouth daily. Needs appt Saguier, Edward, PA-C 04/11/2024 Active Self  nicotine  (NICODERM CQ  - DOSED IN MG/24 HOURS) 21 mg/24hr patch [id number] No Place 21 mg onto the skin daily. [provider] 04/12/2024 Active Self  omeprazole  (PRILOSEC) 20 MG capsule [id number]  TAKE 1 CAPSULE(20 MG) BY MOUTH DAILY Saguier, Edward, PA-C  Active Self  OXYGEN  [id number]  Inhale 2 L/min into the lungs continuous. [provider]  Active   sildenafil  (REVATIO ) 20 MG tablet [id number] No 3-5 tab prior to sex.(Max use once in 24 hour period) Saguier, Loving, PA-C Past Month Active Self  tamsulosin  (FLOMAX ) 0.4 MG CAPS capsule [id number] No Take 0.4 mg by mouth in the morning. [provider] 04/12/2024 Active Self  timolol (TIMOPTIC) 0.5 % ophthalmic solution [id number] No Place 1 drop into the right eye daily. [provider] 04/11/2024 Active Self  traMADol  (ULTRAM ) 50 MG tablet [id number]  Take 1 tablet (50 mg total) by mouth every 6 (six) hours as needed for severe pain (pain score 7-10).  Patient not taking: Reported on 04/15/2024   Baglia, Corrina, PA-C  Active             Recommendation:   Continue Current Plan of Care  Follow Up Plan:   Telephone follow-up in 1 week  Medford Balboa, BSN, RN [facility]  VBCI - [facility] RN Care Manager [phone]

## 2024-04-25 DIAGNOSIS — J449 Chronic obstructive pulmonary disease, unspecified: Secondary | ICD-10-CM | POA: Diagnosis not present

## 2024-04-25 DIAGNOSIS — E785 Hyperlipidemia, unspecified: Secondary | ICD-10-CM | POA: Diagnosis not present

## 2024-04-25 DIAGNOSIS — Z48812 Encounter for surgical aftercare following surgery on the circulatory system: Secondary | ICD-10-CM | POA: Diagnosis not present

## 2024-04-25 DIAGNOSIS — K5090 Crohn's disease, unspecified, without complications: Secondary | ICD-10-CM | POA: Diagnosis not present

## 2024-04-25 DIAGNOSIS — I10 Essential (primary) hypertension: Secondary | ICD-10-CM | POA: Diagnosis not present

## 2024-04-25 DIAGNOSIS — I739 Peripheral vascular disease, unspecified: Secondary | ICD-10-CM | POA: Diagnosis not present

## 2024-04-26 ENCOUNTER — Ambulatory Visit (HOSPITAL_COMMUNITY)
Admission: RE | Admit: 2024-04-26 | Discharge: 2024-04-26 | Disposition: A | Discharge status: Home / Self Care | Source: Ambulatory Visit | Attending: Vascular Surgery | Admitting: Vascular Surgery

## 2024-04-26 DIAGNOSIS — I7143 Infrarenal abdominal aortic aneurysm, without rupture: Secondary | ICD-10-CM | POA: Diagnosis not present

## 2024-04-26 MED ORDER — IOHEXOL 350 MG/ML SOLN
75.0000 mL | Freq: Once | INTRAVENOUS | Status: AC | PRN
  Administered 2024-04-26: 75 mL via INTRAVENOUS

## 2024-04-29 ENCOUNTER — Other Ambulatory Visit: Payer: Self-pay

## 2024-04-29 DIAGNOSIS — E785 Hyperlipidemia, unspecified: Secondary | ICD-10-CM | POA: Diagnosis not present

## 2024-04-29 DIAGNOSIS — K5090 Crohn's disease, unspecified, without complications: Secondary | ICD-10-CM | POA: Diagnosis not present

## 2024-04-29 DIAGNOSIS — I10 Essential (primary) hypertension: Secondary | ICD-10-CM | POA: Diagnosis not present

## 2024-04-29 DIAGNOSIS — Z48812 Encounter for surgical aftercare following surgery on the circulatory system: Secondary | ICD-10-CM | POA: Diagnosis not present

## 2024-04-29 DIAGNOSIS — I739 Peripheral vascular disease, unspecified: Secondary | ICD-10-CM | POA: Diagnosis not present

## 2024-04-29 DIAGNOSIS — J449 Chronic obstructive pulmonary disease, unspecified: Secondary | ICD-10-CM | POA: Diagnosis not present

## 2024-04-29 NOTE — Patient Instructions (Signed)
 Visit Information  Thank you for taking time to visit with me today. Please don't hesitate to contact me if I can be of assistance to you before our next scheduled telephone appointment.  Our next appointment is by telephone on Monday August 11th at 10:30am  Following is a copy of your care plan:   Goals Addressed             This Visit's Progress    VBCI Transitions of Care (TOC) Care Plan       Problems: (reviewed 04/29/24) Recent Hospitalization for treatment of COPD No Hospital Follow Up Provider appointment The patient declines a hospital follow up provider appointment  Goal:  (reviewed 04/29/24) Over the next 30 days, the patient will not experience hospital readmission  Interventions:  (reviewed 04/29/24)  COPD Interventions: Advised patient to track and manage COPD triggers Discussed the importance of adequate rest and management of fatigue with COPD Provided instruction about proper use of medications used for management of COPD including inhalers Screening for signs and symptoms of depression related to chronic disease state  Use of home oxygen  Smoking cessation  Oxygen  at 2l/min continuous Nebulizer treatments and Albuterol  inhaler as needed Manage energy conservation - utilize electric scooter when leaving the apartment Participate weekly in HHPT Energy conservation   POST OP Femoral Groin Care:  (reviewed 04/29/24)  - Leave bandage in place until directed by the provider to remove it  - Assess for wound infection - increased drainage from the incision site, redness, edema  - Staple removal as directed by the provider - 05/01/24  - Keep the incision clean and dry  - Home Health RN twice a week  Patient Self Care Activities:  (reviewed 04/29/24) Attend all scheduled provider appointments Call pharmacy for medication refills 3-7 days in advance of running out of medications Call provider office for new concerns or questions  Notify RN Care Manager of [facility] call  rescheduling needs Participate in Transition of Care Program/Attend [facility] scheduled calls Perform all self care activities independently  Take medications as prescribed    Plan:  Telephone follow up appointment with care management team member scheduled for:  Monday August 11th at 11:00am         Patient verbalizes understanding of instructions and care plan provided today and agrees to view in MyChart. Active MyChart status and patient understanding of how to access instructions and care plan via MyChart confirmed with patient.     The patient has been provided with contact information for the care management team and has been advised to call with any health related questions or concerns.   Please call the care guide team at [phone] if you need to cancel or reschedule your appointment.   Please call the Suicide and Crisis Lifeline: 988 call the [place]  National Suicide Prevention Lifeline: (706)845-5638 or TTY: 320-137-2045 TTY (754)615-9960) to talk to a trained counselor if you are experiencing a Mental Health or Behavioral Health Crisis or need someone to talk to.  Medford Balboa, BSN, RN [place]  VBCI - [employer] [facility] RN Care Manager [phone]

## 2024-04-29 NOTE — Transitions of Care (Post Inpatient/ED Visit) (Signed)
 Transition of Care week 3  Visit Note  04/29/2024  Name: Martin Santiago MRN: [id number]          DOB: Dec 01, 1950  Situation: Patient enrolled in [facility] 30-day program. Visit completed with Cletus Faniel by telephone.   Background:   Past Medical History:  Diagnosis Date   AAA (abdominal aortic aneurysm) (HCC)    9.8 cm 04/10/24 CTA   Closed C1 fracture (HCC) 05/28/2023   Colon polyps    COPD (chronic obstructive pulmonary disease) (HCC)    Crohn's disease (HCC)    Detached retina    Essential hypertension    Tropical sprue     Assessment: Patient Reported Symptoms: Cognitive Cognitive Status: Alert and oriented to person, place, and time      Neurological Neurological Review of Symptoms: No symptoms reported    HEENT HEENT Symptoms Reported: No symptoms reported      Cardiovascular Cardiovascular Symptoms Reported: No symptoms reported    Respiratory Respiratory Symptoms Reported: Shortness of breath Additional Respiratory Details: The patient states he is in a good respiratory place today. the heat and humidity have subsided a bit and he is sitting outside smoking a cigarette and is not wearing his oxygen . He states he is able to go periods of time without using it. He does not always check his pulse oximeter but decides if he needs Oxygen  based upon how he feels Respiratory Management Strategies: Adequate rest, Oxygen  therapy, Routine screening, Medication therapy  Endocrine Endocrine Symptoms Reported: No symptoms reported Is patient diabetic?: No    Gastrointestinal Gastrointestinal Symptoms Reported: No symptoms reported      Genitourinary Genitourinary Symptoms Reported: No symptoms reported    Integumentary Integumentary Symptoms Reported: Incision Additional Integumentary Details: Staples intact. The patient will have the staples removed on 05/03/24. The patient continues to apply a dry bandage to keep the staples covered. Skin Management Strategies: Routine  screening, Dressing changes Skin Comment: HHTN twice a week  Musculoskeletal Musculoskelatal Symptoms Reviewed: Limited mobility, Weakness Musculoskeletal Management Strategies: Routine screening, Exercise, Adequate rest Musculoskeletal Comment: Continues with HHPT and uses an electric scooter for longdistance mobility      Psychosocial Psychosocial Symptoms Reported: No symptoms reported         There were no vitals filed for this visit.  Medications Reviewed Today     Reviewed by [name], RN (Case Manager) on 04/29/24 at 1107  Med List Status: <None>   Medication Order Taking? Sig Documenting Provider Last Dose Status Informant  albuterol  (PROVENTIL ) (2.5 MG/3ML) 0.083% nebulizer solution [id number] No Take 3 mLs (2.5 mg total) by nebulization every 4 (four) hours.  Patient taking differently: Take 2.5 mg by nebulization every 4 (four) hours as needed for wheezing or shortness of breath (COPD).   Kara Dorn NOVAK, MD 04/12/2024  9:00 AM Active Self  albuterol  (VENTOLIN  HFA) 108 (90 Base) MCG/ACT inhaler [id number] No Inhale 2 puffs into the lungs every 6 (six) hours as needed for wheezing or shortness of breath. Kara Dorn NOVAK, MD 04/12/2024  9:00 AM Active Self  amLODipine  (NORVASC ) 10 MG tablet [id number] No Take 1 tablet (10 mg total) by mouth daily. Needs appt Dorina Loving, PA-C 04/12/2024  9:00 AM Active Self  aspirin  EC 81 MG tablet [id number] No Take 1 tablet (81 mg total) by mouth daily. Swallow whole. Krasowski, Robert J, MD 04/12/2024  9:00 AM Active Self  atorvastatin  (LIPITOR) 20 MG tablet [id number]  Take 1 tablet (20 mg total) by mouth daily. Charlyne Reed, PA-C  Active   azithromycin  (ZITHROMAX ) 250 MG tablet [id number]  Take 1 tablet (250 mg total) by mouth daily. Kara Dorn NOVAK, MD  Active Self  BREZTRI  AEROSPHERE 160-9-4.8 MCG/ACT TERESE [id number] No INHALE 2 PUFFS INTO THE LUNGS IN THE MORNING AND AT BEDTIME Kara Dorn NOVAK, MD 04/12/2024  9:00 AM  Active Self  Ensifentrine  (OHTUVAYRE ) 3 MG/2.5ML SUSP [id number]  Inhale 3 mg into the lungs in the morning and at bedtime. Kara Dorn NOVAK, MD  Active Self           Med Note MARGETTE, Ava Deguire   Mon Apr 22, 2024 10:55 AM)    losartan  (COZAAR ) 100 MG tablet [id number] No Take 1 tablet (100 mg total) by mouth daily. Needs appt Saguier, Edward, PA-C 04/11/2024 Active Self  nicotine  (NICODERM CQ  - DOSED IN MG/24 HOURS) 21 mg/24hr patch [id number] No Place 21 mg onto the skin daily. [provider] 04/12/2024 Active Self  omeprazole  (PRILOSEC) 20 MG capsule [id number]  TAKE 1 CAPSULE(20 MG) BY MOUTH DAILY Saguier, Edward, PA-C  Active Self  OXYGEN  [id number]  Inhale 2 L/min into the lungs continuous. [provider]  Active   sildenafil  (REVATIO ) 20 MG tablet [id number] No 3-5 tab prior to sex.(Max use once in 24 hour period) Saguier, [place], PA-C Past Month Active Self  tamsulosin  (FLOMAX ) 0.4 MG CAPS capsule [id number] No Take 0.4 mg by mouth in the morning. [provider] 04/12/2024 Active Self  timolol (TIMOPTIC) 0.5 % ophthalmic solution [id number] No Place 1 drop into the right eye daily. [provider] 04/11/2024 Active Self  traMADol  (ULTRAM ) 50 MG tablet [id number]  Take 1 tablet (50 mg total) by mouth every 6 (six) hours as needed for severe pain (pain score 7-10).  Patient not taking: Reported on 04/15/2024   Baglia, Corrina, PA-C  Active             Recommendation:   Continue Current Plan of Care  Follow Up Plan:   Telephone follow-up in 1 week  Medford Balboa, BSN, RN [place]  VBCI - [facility] RN Care Manager [phone]

## 2024-04-30 DIAGNOSIS — I739 Peripheral vascular disease, unspecified: Secondary | ICD-10-CM | POA: Diagnosis not present

## 2024-04-30 DIAGNOSIS — E785 Hyperlipidemia, unspecified: Secondary | ICD-10-CM | POA: Diagnosis not present

## 2024-04-30 DIAGNOSIS — Z48812 Encounter for surgical aftercare following surgery on the circulatory system: Secondary | ICD-10-CM | POA: Diagnosis not present

## 2024-04-30 DIAGNOSIS — I10 Essential (primary) hypertension: Secondary | ICD-10-CM | POA: Diagnosis not present

## 2024-04-30 DIAGNOSIS — J449 Chronic obstructive pulmonary disease, unspecified: Secondary | ICD-10-CM | POA: Diagnosis not present

## 2024-04-30 DIAGNOSIS — K5090 Crohn's disease, unspecified, without complications: Secondary | ICD-10-CM | POA: Diagnosis not present

## 2024-04-30 NOTE — Progress Notes (Unsigned)
 POST OPERATIVE OFFICE NOTE    CC:  F/u for surgery  HPI:  This is a 73 y.o. male who is s/p EVAR, placement of Endo anchors, and cutdown right CFA with primary repair of arteriotomy on 04/12/2024 by Dr. Sheree.  He was discharged home on POD 2,  he is on home O2 at baseline.    Pt returns today for follow up for incision check.  Pt states he has done quite well.  He has not had to take any pain medication.  He has some swelling in both feet.  He is elevating his legs but not quite above his heart.  He denies any pain in his feet.  He continues to smoke with no interest in quitting.  He states he used to smoke about 40 cigarettes per day but now is down to 6-7 per day and uses a patch to help him continue the cutdown.  He denies any abdominal pain.  He takes asa/statin daily.    Allergies  Allergen Reactions   Codeine Nausea And Vomiting and Other (See Comments)    Other reaction(s): Flushing (ALLERGY/intolerance) fainting    Current Outpatient Medications  Medication Sig Dispense Refill   albuterol  (PROVENTIL ) (2.5 MG/3ML) 0.083% nebulizer solution Take 3 mLs (2.5 mg total) by nebulization every 4 (four) hours. (Patient taking differently: Take 2.5 mg by nebulization every 4 (four) hours as needed for wheezing or shortness of breath (COPD).) 540 mL 11   albuterol  (VENTOLIN  HFA) 108 (90 Base) MCG/ACT inhaler Inhale 2 puffs into the lungs every 6 (six) hours as needed for wheezing or shortness of breath. 18 g 11   amLODipine  (NORVASC ) 10 MG tablet Take 1 tablet (10 mg total) by mouth daily. Needs appt 30 tablet 0   aspirin  EC 81 MG tablet Take 1 tablet (81 mg total) by mouth daily. Swallow whole.     atorvastatin  (LIPITOR) 20 MG tablet Take 1 tablet (20 mg total) by mouth daily. 90 tablet 3   azithromycin  (ZITHROMAX ) 250 MG tablet Take 1 tablet (250 mg total) by mouth daily. 30 tablet 5   BREZTRI  AEROSPHERE 160-9-4.8 MCG/ACT AERO INHALE 2 PUFFS INTO THE LUNGS IN THE MORNING AND AT BEDTIME 10.7  g 5   Ensifentrine  (OHTUVAYRE ) 3 MG/2.5ML SUSP Inhale 3 mg into the lungs in the morning and at bedtime. 450 mL 1   losartan  (COZAAR ) 100 MG tablet Take 1 tablet (100 mg total) by mouth daily. Needs appt 30 tablet 0   nicotine  (NICODERM CQ  - DOSED IN MG/24 HOURS) 21 mg/24hr patch Place 21 mg onto the skin daily.     omeprazole  (PRILOSEC) 20 MG capsule TAKE 1 CAPSULE(20 MG) BY MOUTH DAILY 90 capsule 2   OXYGEN  Inhale 2 L/min into the lungs continuous.     sildenafil  (REVATIO ) 20 MG tablet 3-5 tab prior to sex.(Max use once in 24 hour period) 50 tablet 0   tamsulosin  (FLOMAX ) 0.4 MG CAPS capsule Take 0.4 mg by mouth in the morning.     timolol (TIMOPTIC) 0.5 % ophthalmic solution Place 1 drop into the right eye daily.     traMADol  (ULTRAM ) 50 MG tablet Take 1 tablet (50 mg total) by mouth every 6 (six) hours as needed for severe pain (pain score 7-10). (Patient not taking: Reported on 04/15/2024) 20 tablet 0   No current facility-administered medications for this visit.     ROS:  See HPI  Physical Exam:  Today's Vitals   05/01/24 0916  BP: (!) 137/90  Temp: 97.8 F (36.6 C)  TempSrc: Temporal   There is no height or weight on file to calculate BMI.   Incision:  right groin has healed and staples removed.  Extremities:  brisk multiphasic doppler flow bilateral DP/PT; mild ankle edema bilaterally Abdomen:  soft, NT   CTA a/p 04/26/2024: IMPRESSION: 1. Post treatment changes from placement of an infrarenal abdominal aortic stent graft with stable aneurysm size. There is no definite evidence of endoleak on arterial phase imaging (noncontrast and delayed phase imaging was not obtained). 2. Possible mild to moderate stenosis in the left common iliac limb. 3. The next follow-up imaging series, consider further evaluation with stent graft protocol including noncontrast, arterial, and venous phase imaging.    Assessment/Plan:  This is a 73 y.o. male who is s/p:  EVAR, placement of Endo  anchors, and cutdown right CFA with primary repair of arteriotomy on 04/12/2024 by Dr. Sheree.  He was discharged home on POD 2,  he is on home O2 at baseline.    -pt doing well and staples removed from right groin.  Stressed with pt to clean with soap and water  daily and otherwise keep dry.   He has brisk multiphasic doppler flow in bilateral DP/PT.  -pt was seen by Dr. Sheree today who did review his CTA and looks fine.  -continue asa/statin -he has follow up with Dr. Sheree on 05/15/2024 but since Dr. Sheree was able to examine and speak to pt today, we will cancel the 8/20 appt and schedule a 6 month f/u with EVAR duplex with PA on Dr. Sheree clinic day.     Lucie Apt, [facility] [phone]   Clinic MD:  Sheree

## 2024-05-01 ENCOUNTER — Ambulatory Visit: Attending: Vascular Surgery | Admitting: Physician Assistant

## 2024-05-01 ENCOUNTER — Encounter: Payer: Self-pay | Admitting: Vascular Surgery

## 2024-05-01 ENCOUNTER — Encounter: Payer: Self-pay | Admitting: Physician Assistant

## 2024-05-01 VITALS — BP 137/90 | Temp 97.8°F

## 2024-05-01 DIAGNOSIS — I7143 Infrarenal abdominal aortic aneurysm, without rupture: Secondary | ICD-10-CM

## 2024-05-02 ENCOUNTER — Other Ambulatory Visit: Payer: Self-pay | Admitting: *Deleted

## 2024-05-02 DIAGNOSIS — Z8679 Personal history of other diseases of the circulatory system: Secondary | ICD-10-CM

## 2024-05-03 DIAGNOSIS — K5090 Crohn's disease, unspecified, without complications: Secondary | ICD-10-CM | POA: Diagnosis not present

## 2024-05-03 DIAGNOSIS — E785 Hyperlipidemia, unspecified: Secondary | ICD-10-CM | POA: Diagnosis not present

## 2024-05-03 DIAGNOSIS — Z48812 Encounter for surgical aftercare following surgery on the circulatory system: Secondary | ICD-10-CM | POA: Diagnosis not present

## 2024-05-03 DIAGNOSIS — I739 Peripheral vascular disease, unspecified: Secondary | ICD-10-CM | POA: Diagnosis not present

## 2024-05-03 DIAGNOSIS — I10 Essential (primary) hypertension: Secondary | ICD-10-CM | POA: Diagnosis not present

## 2024-05-03 DIAGNOSIS — J449 Chronic obstructive pulmonary disease, unspecified: Secondary | ICD-10-CM | POA: Diagnosis not present

## 2024-05-06 ENCOUNTER — Other Ambulatory Visit: Payer: Self-pay

## 2024-05-06 DIAGNOSIS — K5090 Crohn's disease, unspecified, without complications: Secondary | ICD-10-CM | POA: Diagnosis not present

## 2024-05-06 DIAGNOSIS — E785 Hyperlipidemia, unspecified: Secondary | ICD-10-CM | POA: Diagnosis not present

## 2024-05-06 DIAGNOSIS — I10 Essential (primary) hypertension: Secondary | ICD-10-CM | POA: Diagnosis not present

## 2024-05-06 DIAGNOSIS — I739 Peripheral vascular disease, unspecified: Secondary | ICD-10-CM | POA: Diagnosis not present

## 2024-05-06 DIAGNOSIS — J449 Chronic obstructive pulmonary disease, unspecified: Secondary | ICD-10-CM | POA: Diagnosis not present

## 2024-05-06 DIAGNOSIS — Z48812 Encounter for surgical aftercare following surgery on the circulatory system: Secondary | ICD-10-CM | POA: Diagnosis not present

## 2024-05-06 NOTE — Patient Instructions (Signed)
 Visit Information  Thank you for taking time to visit with me today. Please don't hesitate to contact me if I can be of assistance to you before our next scheduled telephone appointment.  Our next appointment is by telephone on Monday August 18th at 10:30am  Following is a copy of your care plan:   Goals Addressed             This Visit's Progress    VBCI Transitions of Care (TOC) Care Plan       Problems: (reviewed 05/06/24) Recent Hospitalization for treatment of COPD No Hospital Follow Up Provider appointment The patient declines a hospital follow up provider appointment  Goal:  (reviewed 05/06/24) Over the next 30 days, the patient will not experience hospital readmission  Interventions:  (reviewed 05/06/24)  COPD Interventions: Advised patient to track and manage COPD triggers Discussed the importance of adequate rest and management of fatigue with COPD Provided instruction about proper use of medications used for management of COPD including inhalers Screening for signs and symptoms of depression related to chronic disease state  Use of home oxygen  Smoking cessation  Oxygen  at 2l/min continuous Nebulizer treatments and Albuterol  inhaler as needed Manage energy conservation - utilize electric scooter when leaving the apartment Participate weekly in HHPT Energy conservation   POST OP Femoral Groin Care:  (reviewed 05/06/24)  - Leave bandage in place until directed by the provider to remove it  - Assess for wound infection - increased drainage from the incision site, redness, edema  - Staple removal as directed by the provider - 05/01/24  - Keep the incision clean and dry  - Home Health RN twice a week  Patient Self Care Activities:  (reviewed 05/06/24) Attend all scheduled provider appointments Call pharmacy for medication refills 3-7 days in advance of running out of medications Call provider office for new concerns or questions  Notify RN Care Manager of [facility] call  rescheduling needs Participate in Transition of Care Program/Attend [facility] scheduled calls Perform all self care activities independently  Take medications as prescribed    Plan:  Telephone follow up appointment with care management team member scheduled for:  Monday August 18th at 10:30am         Patient verbalizes understanding of instructions and care plan provided today and agrees to view in MyChart. Active MyChart status and patient understanding of how to access instructions and care plan via MyChart confirmed with patient.     The patient has been provided with contact information for the care management team and has been advised to call with any health related questions or concerns.   Please call the care guide team at [phone] if you need to cancel or reschedule your appointment.   Please call the Suicide and Crisis Lifeline: 988 call the [place]  National Suicide Prevention Lifeline: 272 187 2446 or TTY: 718-725-1921 TTY (347)201-1154) to talk to a trained counselor if you are experiencing a Mental Health or Behavioral Health Crisis or need someone to talk to.

## 2024-05-06 NOTE — Transitions of Care (Post Inpatient/ED Visit) (Signed)
 Transition of Care week 4  Visit Note  05/06/2024  Name: Martin Santiago MRN: [id number]          DOB: 10-25-1950  Situation: Patient enrolled in [facility] 30-day program. Visit completed with Rayjon Stawicki by telephone.   Background:   Past Medical History:  Diagnosis Date   AAA (abdominal aortic aneurysm) (HCC)    9.8 cm 04/10/24 CTA   Closed C1 fracture (HCC) 05/28/2023   Colon polyps    COPD (chronic obstructive pulmonary disease) (HCC)    Crohn's disease (HCC)    Detached retina    Essential hypertension    Tropical sprue     Assessment: Patient Reported Symptoms: Cognitive Cognitive Status: Alert and oriented to person, place, and time      Neurological Neurological Review of Symptoms: No symptoms reported    HEENT HEENT Symptoms Reported: No symptoms reported      Cardiovascular Cardiovascular Symptoms Reported: Swelling in legs or feet Does patient have uncontrolled Hypertension?: No Cardiovascular Management Strategies: Routine screening Weight: 132 lb (59.9 kg) Cardiovascular Comment: Slight edema  Respiratory Respiratory Symptoms Reported: Shortness of breath Other Respiratory Symptoms: SOB with exertion Additional Respiratory Details: Reviewed medications for COPD. He takes his nebulizer, Breztri , Ohtuvayre  and he is on an antibiotic long term. He doesn't notice a change in his symptoms. He does continue to smoke 6-7 cigarettes a day. He remains on O2 at 2l/min Respiratory Management Strategies: Adequate rest, Oxygen  therapy, Routine screening, Medication therapy  Endocrine Endocrine Symptoms Reported: No symptoms reported Is patient diabetic?: No    Gastrointestinal Gastrointestinal Symptoms Reported: No symptoms reported      Genitourinary Genitourinary Symptoms Reported: No symptoms reported    Integumentary Integumentary Symptoms Reported: Incision Additional Integumentary Details: Staples removed. No open area. HHRN to assess incision today Skin  Management Strategies: Routine screening Skin Comment: HHRN twice a week  Musculoskeletal Musculoskelatal Symptoms Reviewed: Weakness, Limited mobility Musculoskeletal Management Strategies: Routine screening, Activity, Adequate rest Musculoskeletal Comment: Continues with HHPT twice a week      Psychosocial Psychosocial Symptoms Reported: No symptoms reported         There were no vitals filed for this visit.  Medications Reviewed Today     Reviewed by [name], RN (Case Manager) on 05/06/24 at 1052  Med List Status: <None>   Medication Order Taking? Sig Documenting Provider Last Dose Status Informant  albuterol  (PROVENTIL ) (2.5 MG/3ML) 0.083% nebulizer solution [id number] No Take 3 mLs (2.5 mg total) by nebulization every 4 (four) hours.  Patient taking differently: Take 2.5 mg by nebulization every 4 (four) hours as needed for wheezing or shortness of breath (COPD).   Kara Dorn NOVAK, MD 04/12/2024  9:00 AM Active Self  albuterol  (VENTOLIN  HFA) 108 (90 Base) MCG/ACT inhaler [id number] No Inhale 2 puffs into the lungs every 6 (six) hours as needed for wheezing or shortness of breath. Kara Dorn NOVAK, MD 04/12/2024  9:00 AM Active Self  amLODipine  (NORVASC ) 10 MG tablet [id number] No Take 1 tablet (10 mg total) by mouth daily. Needs appt Dorina Loving, PA-C 04/12/2024  9:00 AM Active Self  aspirin  EC 81 MG tablet [id number] No Take 1 tablet (81 mg total) by mouth daily. Swallow whole. Krasowski, Robert J, MD 04/12/2024  9:00 AM Active Self  atorvastatin  (LIPITOR) 20 MG tablet [id number]  Take 1 tablet (20 mg total) by mouth daily. Baglia, Corrina, PA-C  Active   azithromycin  (ZITHROMAX ) 250 MG tablet [id number]  Take 1 tablet (250 mg total) by mouth  daily. Kara Dorn NOVAK, MD  Active Self  BREZTRI  AEROSPHERE 160-9-4.8 MCG/ACT TERESE [id number] No INHALE 2 PUFFS INTO THE LUNGS IN THE MORNING AND AT BEDTIME Kara Dorn NOVAK, MD 04/12/2024  9:00 AM Active Self  Ensifentrine   (OHTUVAYRE ) 3 MG/2.5ML SUSP [id number]  Inhale 3 mg into the lungs in the morning and at bedtime. Kara Dorn NOVAK, MD  Active Self           Med Note MARGETTE, Maanvi Lecompte   Mon Apr 22, 2024 10:55 AM)    losartan  (COZAAR ) 100 MG tablet [id number] No Take 1 tablet (100 mg total) by mouth daily. Needs appt Saguier, Edward, PA-C 04/11/2024 Active Self  nicotine  (NICODERM CQ  - DOSED IN MG/24 HOURS) 21 mg/24hr patch [id number] No Place 21 mg onto the skin daily. [provider] 04/12/2024 Active Self  omeprazole  (PRILOSEC) 20 MG capsule [id number]  TAKE 1 CAPSULE(20 MG) BY MOUTH DAILY Saguier, Edward, PA-C  Active Self  OXYGEN  [id number]  Inhale 2 L/min into the lungs continuous. [provider]  Active   sildenafil  (REVATIO ) 20 MG tablet [id number] No 3-5 tab prior to sex.(Max use once in 24 hour period) Saguier, [place], PA-C Past Month Active Self  tamsulosin  (FLOMAX ) 0.4 MG CAPS capsule [id number] No Take 0.4 mg by mouth in the morning. [provider] 04/12/2024 Active Self  timolol (TIMOPTIC) 0.5 % ophthalmic solution [id number] No Place 1 drop into the right eye daily. [provider] 04/11/2024 Active Self  traMADol  (ULTRAM ) 50 MG tablet [id number]  Take 1 tablet (50 mg total) by mouth every 6 (six) hours as needed for severe pain (pain score 7-10).  Patient not taking: Reported on 04/15/2024   Baglia, Corrina, PA-C  Active             Recommendation:   Continue Current Plan of Care  Follow Up Plan:   Telephone follow-up in 1 week  Medford Balboa, BSN, RN [place]  VBCI - [facility] RN Care Manager [phone]

## 2024-05-10 DIAGNOSIS — E785 Hyperlipidemia, unspecified: Secondary | ICD-10-CM | POA: Diagnosis not present

## 2024-05-10 DIAGNOSIS — I10 Essential (primary) hypertension: Secondary | ICD-10-CM | POA: Diagnosis not present

## 2024-05-10 DIAGNOSIS — I739 Peripheral vascular disease, unspecified: Secondary | ICD-10-CM | POA: Diagnosis not present

## 2024-05-10 DIAGNOSIS — K5090 Crohn's disease, unspecified, without complications: Secondary | ICD-10-CM | POA: Diagnosis not present

## 2024-05-10 DIAGNOSIS — Z48812 Encounter for surgical aftercare following surgery on the circulatory system: Secondary | ICD-10-CM | POA: Diagnosis not present

## 2024-05-10 DIAGNOSIS — J449 Chronic obstructive pulmonary disease, unspecified: Secondary | ICD-10-CM | POA: Diagnosis not present

## 2024-05-12 DIAGNOSIS — K5090 Crohn's disease, unspecified, without complications: Secondary | ICD-10-CM | POA: Diagnosis not present

## 2024-05-12 DIAGNOSIS — I10 Essential (primary) hypertension: Secondary | ICD-10-CM | POA: Diagnosis not present

## 2024-05-12 DIAGNOSIS — J449 Chronic obstructive pulmonary disease, unspecified: Secondary | ICD-10-CM | POA: Diagnosis not present

## 2024-05-12 DIAGNOSIS — I739 Peripheral vascular disease, unspecified: Secondary | ICD-10-CM | POA: Diagnosis not present

## 2024-05-12 DIAGNOSIS — Z48812 Encounter for surgical aftercare following surgery on the circulatory system: Secondary | ICD-10-CM | POA: Diagnosis not present

## 2024-05-12 DIAGNOSIS — E785 Hyperlipidemia, unspecified: Secondary | ICD-10-CM | POA: Diagnosis not present

## 2024-05-13 ENCOUNTER — Other Ambulatory Visit: Payer: Self-pay

## 2024-05-13 NOTE — Transitions of Care (Post Inpatient/ED Visit) (Signed)
 Transition of Care week 5  Visit Note  05/13/2024  Name: Martin Santiago MRN: [id number]          DOB: [birth year]  Situation: Patient enrolled in [facility] 30-day program. Visit completed with Jarris Bordley by telephone.   Background:   Past Medical History:  Diagnosis Date   AAA (abdominal aortic aneurysm) (HCC)    9.8 cm 04/10/24 CTA   Closed C1 fracture (HCC) 05/28/2023   Colon polyps    COPD (chronic obstructive pulmonary disease) (HCC)    Crohn's disease (HCC)    Detached retina    Essential hypertension    Tropical sprue     Assessment: Patient Reported Symptoms: Cognitive Cognitive Status: Alert and oriented to person, place, and time      Neurological Neurological Review of Symptoms: No symptoms reported    HEENT HEENT Symptoms Reported: No symptoms reported      Cardiovascular Cardiovascular Symptoms Reported: Swelling in legs or feet Does patient have uncontrolled Hypertension?: No Weight: 132 lb (59.9 kg) Cardiovascular Comment: Trace edema  Respiratory Respiratory Symptoms Reported: Wheezing, Dry cough Other Respiratory Symptoms: SOB with exertion Additional Respiratory Details: Rewviewed medications for COPD. He doesn't notice much changed with the addition of the Otuvayre. he does see his pulmonolgist 05/15/24 and may have it discontinued Respiratory Management Strategies: Adequate rest, Oxygen  therapy, Routine screening, Medication therapy  Endocrine Endocrine Symptoms Reported: No symptoms reported Is patient diabetic?: No    Gastrointestinal Gastrointestinal Symptoms Reported: No symptoms reported      Genitourinary Genitourinary Symptoms Reported: No symptoms reported    Integumentary Integumentary Symptoms Reported: Incision Skin Management Strategies: Routine screening Skin Comment: Healed. HHRN has stopped  Musculoskeletal Musculoskelatal Symptoms Reviewed: Limited mobility, Weakness Musculoskeletal Comment: Has HHPT for a couple more weeks. He uses  his scooter for lng distance ambulation      Psychosocial Psychosocial Symptoms Reported: No symptoms reported         There were no vitals filed for this visit.  Medications Reviewed Today     Reviewed by [name], RN (Case Manager) on 05/13/24 at 1041  Med List Status: <None>   Medication Order Taking? Sig Documenting Provider Last Dose Status Informant  albuterol  (PROVENTIL ) (2.5 MG/3ML) 0.083% nebulizer solution [id number] No Take 3 mLs (2.5 mg total) by nebulization every 4 (four) hours.  Patient taking differently: Take 2.5 mg by nebulization every 4 (four) hours as needed for wheezing or shortness of breath (COPD).   Kara Dorn NOVAK, MD 04/12/2024  9:00 AM Active Self  albuterol  (VENTOLIN  HFA) 108 (90 Base) MCG/ACT inhaler [id number] No Inhale 2 puffs into the lungs every 6 (six) hours as needed for wheezing or shortness of breath. Kara Dorn NOVAK, MD 04/12/2024  9:00 AM Active Self  amLODipine  (NORVASC ) 10 MG tablet [id number] No Take 1 tablet (10 mg total) by mouth daily. Needs appt Saguier, Edward, PA-C 04/12/2024  9:00 AM Active Self  aspirin  EC 81 MG tablet [id number] No Take 1 tablet (81 mg total) by mouth daily. Swallow whole. Krasowski, Robert J, MD 04/12/2024  9:00 AM Active Self  atorvastatin  (LIPITOR) 20 MG tablet [id number]  Take 1 tablet (20 mg total) by mouth daily. Baglia, Corrina, PA-C  Active   azithromycin  (ZITHROMAX ) 250 MG tablet [id number]  Take 1 tablet (250 mg total) by mouth daily. Kara Dorn NOVAK, MD  Active Self  BREZTRI  AEROSPHERE 160-9-4.8 MCG/ACT TERESE [id number] No INHALE 2 PUFFS INTO THE LUNGS IN THE MORNING AND AT BEDTIME Kara Dorn NOVAK, MD  04/12/2024  9:00 AM Active Self  Ensifentrine  (OHTUVAYRE ) 3 MG/2.5ML SUSP [id number]  Inhale 3 mg into the lungs in the morning and at bedtime. Kara Dorn NOVAK, MD  Active Self           Med Note MARGETTE, Ermin Parisien   Mon Apr 22, 2024 10:55 AM)    losartan  (COZAAR ) 100 MG tablet [id number] No Take 1  tablet (100 mg total) by mouth daily. Needs appt Saguier, Edward, PA-C 04/11/2024 Active Self  nicotine  (NICODERM CQ  - DOSED IN MG/24 HOURS) 21 mg/24hr patch [id number] No Place 21 mg onto the skin daily. [provider] 04/12/2024 Active Self  omeprazole  (PRILOSEC) 20 MG capsule [id number]  TAKE 1 CAPSULE(20 MG) BY MOUTH DAILY Saguier, Edward, PA-C  Active Self  OXYGEN  [id number]  Inhale 2 L/min into the lungs continuous. [provider]  Active   sildenafil  (REVATIO ) 20 MG tablet [id number] No 3-5 tab prior to sex.(Max use once in 24 hour period) Saguier, [place], PA-C Past Month Active Self  tamsulosin  (FLOMAX ) 0.4 MG CAPS capsule [id number] No Take 0.4 mg by mouth in the morning. [provider] 04/12/2024 Active Self  timolol (TIMOPTIC) 0.5 % ophthalmic solution [id number] No Place 1 drop into the right eye daily. [provider] 04/11/2024 Active Self  traMADol  (ULTRAM ) 50 MG tablet [id number]  Take 1 tablet (50 mg total) by mouth every 6 (six) hours as needed for severe pain (pain score 7-10).  Patient not taking: Reported on 04/15/2024   Charlyne Reed, PA-C  Active             Recommendation:   Closure from [facility] Program  Follow Up Plan:   Closing From:  Transitions of Care Program  Medford Balboa, BSN, RN [place]  VBCI - [facility] RN Care Manager [phone]

## 2024-05-13 NOTE — Patient Instructions (Signed)
 Visit Information  Thank you for taking time to visit with me today. Please don't hesitate to contact me if I can be of assistance to you before our next scheduled telephone appointment.   Following is a copy of your care plan:   Goals Addressed             This Visit's Progress    COMPLETED: VBCI Transitions of Care (TOC) Care Plan       Problems: (reviewed 05/13/24) Recent Hospitalization for treatment of COPD No Hospital Follow Up Provider appointment The patient declines a hospital follow up provider appointment  Goal:  (reviewed 05/13/24) Over the next 30 days, the patient will not experience hospital readmission  Interventions:  (reviewed 05/13/24)  COPD Interventions: Advised patient to track and manage COPD triggers Discussed the importance of adequate rest and management of fatigue with COPD Provided instruction about proper use of medications used for management of COPD including inhalers Screening for signs and symptoms of depression related to chronic disease state  Use of home oxygen  Smoking cessation  Oxygen  at 2l/min continuous Nebulizer treatments and Albuterol  inhaler as needed Manage energy conservation - utilize electric scooter when leaving the apartment Participate weekly in HHPT Energy conservation   POST OP Femoral Groin Care:  (reviewed 05/13/24)  - Leave bandage in place until directed by the provider to remove it  - Assess for wound infection - increased drainage from the incision site, redness, edema  - Staple removal as directed by the provider - 05/01/24  - Keep the incision clean and dry  - Home Health RN twice a week - Completed 05/10/24  Patient Self Care Activities:  (reviewed 05/13/24) Attend all scheduled provider appointments Call pharmacy for medication refills 3-7 days in advance of running out of medications Call provider office for new concerns or questions  Notify RN Care Manager of TOC call rescheduling needs Participate in Transition  of Care Program/Attend TOC scheduled calls Perform all self care activities independently  Take medications as prescribed    Plan:  Telephone follow up appointment with care management team member scheduled for:  The patient has completed the TOC 30 day program        Patient verbalizes understanding of instructions and care plan provided today and agrees to view in MyChart. Active MyChart status and patient understanding of how to access instructions and care plan via MyChart confirmed with patient.     The patient has been provided with contact information for the care management team and has been advised to call with any health related questions or concerns.   Please call the care guide team at [phone] if you need to cancel or reschedule your appointment.   Please call the Suicide and Crisis Lifeline: 988 call the [place]  National Suicide Prevention Lifeline: 256-781-7455 or TTY: 623-849-2359 TTY (818)884-6752) to talk to a trained counselor if you are experiencing a Mental Health or Behavioral Health Crisis or need someone to talk to.  Medford Balboa, BSN, RN [place]  VBCI - [employer] [facility] RN Care Manager [phone]

## 2024-05-15 ENCOUNTER — Encounter: Payer: Self-pay | Admitting: Pulmonary Disease

## 2024-05-15 ENCOUNTER — Ambulatory Visit: Admitting: Vascular Surgery

## 2024-05-15 ENCOUNTER — Ambulatory Visit (INDEPENDENT_AMBULATORY_CARE_PROVIDER_SITE_OTHER): Admitting: Pulmonary Disease

## 2024-05-15 VITALS — BP 136/88 | HR 78 | Ht 66.0 in | Wt 136.0 lb

## 2024-05-15 DIAGNOSIS — Z7951 Long term (current) use of inhaled steroids: Secondary | ICD-10-CM | POA: Diagnosis not present

## 2024-05-15 DIAGNOSIS — Z48812 Encounter for surgical aftercare following surgery on the circulatory system: Secondary | ICD-10-CM | POA: Diagnosis not present

## 2024-05-15 DIAGNOSIS — F17210 Nicotine dependence, cigarettes, uncomplicated: Secondary | ICD-10-CM | POA: Diagnosis not present

## 2024-05-15 DIAGNOSIS — Z7982 Long term (current) use of aspirin: Secondary | ICD-10-CM | POA: Diagnosis not present

## 2024-05-15 DIAGNOSIS — J449 Chronic obstructive pulmonary disease, unspecified: Secondary | ICD-10-CM | POA: Diagnosis not present

## 2024-05-15 DIAGNOSIS — I739 Peripheral vascular disease, unspecified: Secondary | ICD-10-CM | POA: Diagnosis not present

## 2024-05-15 DIAGNOSIS — R911 Solitary pulmonary nodule: Secondary | ICD-10-CM | POA: Diagnosis not present

## 2024-05-15 DIAGNOSIS — E785 Hyperlipidemia, unspecified: Secondary | ICD-10-CM | POA: Diagnosis not present

## 2024-05-15 DIAGNOSIS — Z9981 Dependence on supplemental oxygen: Secondary | ICD-10-CM | POA: Diagnosis not present

## 2024-05-15 DIAGNOSIS — J432 Centrilobular emphysema: Secondary | ICD-10-CM | POA: Diagnosis not present

## 2024-05-15 DIAGNOSIS — Z993 Dependence on wheelchair: Secondary | ICD-10-CM | POA: Diagnosis not present

## 2024-05-15 DIAGNOSIS — K5090 Crohn's disease, unspecified, without complications: Secondary | ICD-10-CM | POA: Diagnosis not present

## 2024-05-15 DIAGNOSIS — I10 Essential (primary) hypertension: Secondary | ICD-10-CM | POA: Diagnosis not present

## 2024-05-15 NOTE — Patient Instructions (Addendum)
 Continue breztri  inhaler 2 puffs twice daily - rinse mouth out after each use   Continue Ohtuvayre  Nebulizer twice daily  Use albuterol  inhaler 1-2 puffs every 4-6 hours as needed  Continue azithromycin  250mg  daily  Follow up in 6 months

## 2024-05-15 NOTE — Progress Notes (Unsigned)
 Synopsis: Referred in September 2022 for COPD  Subjective:   PATIENT ID: Martin Santiago GENDER: male DOB: Feb 19, 1951, MRN: [id number]  HPI  Chief Complaint  Patient presents with   Medical Management of Chronic Issues    Pt states he's been using his 2L O2 more often then just night    Martin Santiago is a 73 year old male, daily smoker with hypertension and emphysema who returns to pulmonary clinic for COPD follow up.   He has been doing well since last visit. Continues to smoke. He has been on ohtuvayre , daily azithromycin  and breztri  twice daily.   OV 03/12/24 He resides in an independent living facility and has recently required continuous oxygen  use, even at rest, over the past few days. He experiences increased cough and wheezing, along with a constant postnasal drip and changes in his voice. No fever or other systemic symptoms are present.  He has a portable oxygen  concentrator for outings, although he rarely uses it. He has been prescribed steroids in the past, with varying degrees of effectiveness: significant improvement during pollen season last spring, less so in the fall, and some help in January this year.  He is scheduled for a low dose CT scan tomorrow as part of his ongoing care.  He is followed by palliative care and asked whether he needed daily prednisone  for his breathing symptoms.   OV 10/19/23 He presents for an assessment of his need for portable oxygen . He reports a decrease in his overall ability to perform daily activities, including walking from his sofa to the dining room, a distance of approximately 250 feet, without needing to stop to catch his breath. He has been participating in physical and occupational therapy for several hours a week, which prompted the question of whether portable oxygen  would be beneficial.  The patient also reports a persistent dryness in his chest, despite using a nebulizer. He describes difficulty in clearing his chest congestion,  noting that the mucus is thicker than usual. He recalls that on two previous occasions, he was prescribed an antibiotic and prednisone , which seemed to help with similar symptoms.  In addition to his pulmonary concerns, the patient mentions a recent change in his living situation, moving to an independent retirement living community.   OV 06/20/23 They report that their primary care doctor prescribed prednisone , which they have not yet started. They describe their breathing as congested and have noticed an increase in the use of their albuterol  inhaler.   The patient is currently using a Breztri  inhaler twice a day and an albuterol  inhaler as needed.   He is smoking 5-6 cigs daily. He received his motorized scooter after last visit.  OV 10/24/22 He continues to have dyspnea and decreased stamina. He has increased congestion over the past few days. He continues to smoke 4-6 cigarettes per day.   OV 04/19/22 PFTs today show severe obstruction, air trapping and severe diffusion defect.  He is now riding a motorized scooter around the grocery store due to dyspnea.   His wife passed away in 2024-01-12, he continues to grieve her loss. He is considering some travel but is concerned about traveling with a nebulizer machine.  OV 09/24/21 Overnight oximitry showed he spent 7 hours and 17 minutes with an SpO2 less than 88%. Patient was offered supplemental oxygen  but declined at that time. He was started on breztri  inhaler at last visit with good improvement in his shortness of breath. He is also using albuterol  nebulizer twice  daily with relief. He reports he is sleeping better since starting inhaler therapy.   OV 06/25/21 He has had increasing shortness of breath, cough and wheezing over recent months.  He has been on Bevespi  inhaler over the last few years with good effect but he has been having more frequent breakthrough shortness of breath and wheezing recently.  He has an as needed albuterol  inhaler  which he uses but does not find much relief with this inhaler.  He does not have a nebulizer machine at home.  His cough is productive with sputum production on a daily basis.  He continues to smoke daily and has smoked over the past 45 years.  He has a 60+ pack year smoking history.  High-resolution CT scan from 2017 shows extensive centrilobular emphysematous changes.  He has been seeing an ophthalmologist for retinal detachment but does not recall being treated for glaucoma.  Under his problem list there is listed secondary open-angle glaucoma of the left eye.  Past Medical History:  Diagnosis Date   AAA (abdominal aortic aneurysm) (HCC)    9.8 cm 04/10/24 CTA   Closed C1 fracture (HCC) 05/28/2023   Colon polyps    COPD (chronic obstructive pulmonary disease) (HCC)    Crohn's disease (HCC)    Detached retina    Essential hypertension    Tropical sprue      Family History  Problem Relation Age of Onset   Colon cancer Maternal Aunt    Breast cancer Mother      Social History   Socioeconomic History   Marital status: Widowed    Spouse name: Not on file   Number of children: 0   Years of education: Not on file   Highest education level: Bachelor's degree (e.g., BA, AB, BS)  Occupational History   Occupation: retired, Counsellor, Engineer, agricultural: SELF EMPLOYED  Tobacco Use   Smoking status: Every Day    Current packs/day: 2.00    Average packs/day: 2.0 packs/day for 55.0 years (110.0 ttl pk-yrs)    Types: Cigarettes   Smokeless tobacco: Never   Tobacco comments:    Smoke 1/4 pack a day. Tay 04/19/22  Vaping Use   Vaping status: Never Used  Substance and Sexual Activity   Alcohol use: Not Currently    Comment: Rare, 3-4 per yr   Drug use: No    Comment: Remote marijuana   Sexual activity: Not Currently  Other Topics Concern   Not on file  Social History Narrative   Wife, Devere passed away in 2021/12/21 from [place].   1 step-daughter who lives in  [place].   Social Drivers of Corporate investment banker Strain: Low Risk  (01/18/2024)   Overall Financial Resource Strain (CARDIA)    Difficulty of Paying Living Expenses: Not hard at all  Food Insecurity: No Food Insecurity (04/15/2024)   Hunger Vital Sign    Worried About Running Out of Food in the Last Year: Never true    Ran Out of Food in the Last Year: Never true  Transportation Needs: No Transportation Needs (04/15/2024)   PRAPARE - Administrator, Civil Service (Medical): No    Lack of Transportation (Non-Medical): No  Physical Activity: Inactive (01/18/2024)   Exercise Vital Sign    Days of Exercise per Week: 0 days    Minutes of Exercise per Session: 0 min  Stress: No Stress Concern Present (01/18/2024)   [employer] of Occupational Health - Occupational  Stress Questionnaire    Feeling of Stress : Not at all  Social Connections: Moderately Isolated (04/12/2024)   Social Connection and Isolation Panel    Frequency of Communication with Friends and Family: More than three times a week    Frequency of Social Gatherings with Friends and Family: More than three times a week    Attends Religious Services: 1 to 4 times per year    Active Member of [employer] or Organizations: No    Attends Banker Meetings: Never    Marital Status: Widowed  Intimate Partner Violence: Not At Risk (04/15/2024)   Humiliation, Afraid, Rape, and Kick questionnaire    Fear of Current or Ex-Partner: No    Emotionally Abused: No    Physically Abused: No    Sexually Abused: No     Allergies  Allergen Reactions   Codeine Nausea And Vomiting and Other (See Comments)    Other reaction(s): Flushing (ALLERGY/intolerance) fainting     Outpatient Medications Prior to Visit  Medication Sig Dispense Refill   albuterol  (PROVENTIL ) (2.5 MG/3ML) 0.083% nebulizer solution Take 3 mLs (2.5 mg total) by nebulization every 4 (four) hours. (Patient taking differently: Take 2.5 mg by  nebulization every 4 (four) hours as needed for wheezing or shortness of breath (COPD).) 540 mL 11   albuterol  (VENTOLIN  HFA) 108 (90 Base) MCG/ACT inhaler Inhale 2 puffs into the lungs every 6 (six) hours as needed for wheezing or shortness of breath. 18 g 11   amLODipine  (NORVASC ) 10 MG tablet Take 1 tablet (10 mg total) by mouth daily. Needs appt 30 tablet 0   aspirin  EC 81 MG tablet Take 1 tablet (81 mg total) by mouth daily. Swallow whole.     atorvastatin  (LIPITOR) 20 MG tablet Take 1 tablet (20 mg total) by mouth daily. 90 tablet 3   azithromycin  (ZITHROMAX ) 250 MG tablet Take 1 tablet (250 mg total) by mouth daily. 30 tablet 5   BREZTRI  AEROSPHERE 160-9-4.8 MCG/ACT AERO INHALE 2 PUFFS INTO THE LUNGS IN THE MORNING AND AT BEDTIME 10.7 g 5   Ensifentrine  (OHTUVAYRE ) 3 MG/2.5ML SUSP Inhale 3 mg into the lungs in the morning and at bedtime. 450 mL 1   losartan  (COZAAR ) 100 MG tablet Take 1 tablet (100 mg total) by mouth daily. Needs appt 30 tablet 0   nicotine  (NICODERM CQ  - DOSED IN MG/24 HOURS) 21 mg/24hr patch Place 21 mg onto the skin daily.     omeprazole  (PRILOSEC) 20 MG capsule TAKE 1 CAPSULE(20 MG) BY MOUTH DAILY 90 capsule 2   OXYGEN  Inhale 2 L/min into the lungs continuous.     sildenafil  (REVATIO ) 20 MG tablet 3-5 tab prior to sex.(Max use once in 24 hour period) 50 tablet 0   tamsulosin  (FLOMAX ) 0.4 MG CAPS capsule Take 0.4 mg by mouth in the morning.     timolol (TIMOPTIC) 0.5 % ophthalmic solution Place 1 drop into the right eye daily.     traMADol  (ULTRAM ) 50 MG tablet Take 1 tablet (50 mg total) by mouth every 6 (six) hours as needed for severe pain (pain score 7-10). (Patient not taking: Reported on 04/15/2024) 20 tablet 0   No facility-administered medications prior to visit.    Review of Systems  Constitutional:  Negative for chills, fever, malaise/fatigue and weight loss.  HENT:  Negative for congestion, sinus pain and sore throat.   Eyes: Negative.   Respiratory:   Positive for cough, sputum production, shortness of breath and wheezing. Negative for  hemoptysis.   Cardiovascular:  Negative for chest pain, palpitations, orthopnea, claudication and leg swelling.  Gastrointestinal:  Negative for abdominal pain, heartburn, nausea and vomiting.  Genitourinary: Negative.   Musculoskeletal:  Negative for joint pain and myalgias.  Skin:  Negative for rash.  Neurological:  Negative for weakness.  Endo/Heme/Allergies: Negative.   Psychiatric/Behavioral: Negative.     Objective:   Vitals:   05/15/24 1321  BP: 136/88  Pulse: 78  SpO2: (!) 89%  Weight: 136 lb (61.7 kg)  Height: 5' 6 (1.676 m)    Physical Exam Constitutional:      General: He is not in acute distress.    Appearance: Normal appearance.  HENT:     Head: Normocephalic and atraumatic.  Eyes:     General: No scleral icterus.    Conjunctiva/sclera: Conjunctivae normal.  Cardiovascular:     Rate and Rhythm: Normal rate and regular rhythm.     Pulses: Normal pulses.     Heart sounds: Normal heart sounds. No murmur heard. Pulmonary:     Effort: Pulmonary effort is normal.     Breath sounds: Decreased air movement present. No wheezing, rhonchi or rales.  Musculoskeletal:     Right lower leg: No edema.     Left lower leg: No edema.  Skin:    General: Skin is warm and dry.  Neurological:     General: No focal deficit present.     Mental Status: He is alert.    CBC    Component Value Date/Time   WBC 6.9 04/13/2024 0310   RBC 3.30 (L) 04/13/2024 0310   HGB 8.8 (L) 04/13/2024 0310   HCT 28.4 (L) 04/13/2024 0310   PLT 179 04/13/2024 0310   MCV 86.1 04/13/2024 0310   MCH 26.7 04/13/2024 0310   MCHC 31.0 04/13/2024 0310   RDW 15.9 (H) 04/13/2024 0310   LYMPHSABS 0.8 04/06/2023 1129   MONOABS 0.7 04/06/2023 1129   EOSABS 0.2 04/06/2023 1129   BASOSABS 0.1 04/06/2023 1129   Chest imaging: HRCT Chest 2017 1. Moderate to severe centrilobular emphysema. No evidence of interstitial  lung disease. 2. Basilar subpleural nodules are likely subpleural lymph nodes.  PFT:    Latest Ref Rng & Units 04/19/2022   11:51 AM  PFT Results  FVC-Pre L 2.58   FVC-Predicted Pre % 69   FVC-Post L 2.74   FVC-Predicted Post % 74   Pre FEV1/FVC % % 39   Post FEV1/FCV % % 39   FEV1-Pre L 1.01   FEV1-Predicted Pre % 37   FEV1-Post L 1.07   DLCO uncorrected ml/min/mmHg 7.75   DLCO UNC% % 34   DLCO corrected ml/min/mmHg 7.75   DLCO COR %Predicted % 34   DLVA Predicted % 37   TLC L 7.91   TLC % Predicted % 127   RV % Predicted % 196     Assessment & Plan:   Lung nodule  Centrilobular emphysema (HCC)  Cigarette smoker  Discussion: Martin Santiago is a 73 year old male, daily smoker with hypertension and emphysema who returns to pulmonary clinic for COPD follow up.   Chronic Obstructive Pulmonary Disease (COPD) - Continue daily azithromycin  250mg  daily - Continue breztri  inhaler 2 puffs twice daily - Continue Ohtuvayre  Nebulizer treatments twice daily, paperwork completed today - Continue albuterol  as needed  Chronic Hypoxemic Respiratory failure - continue supplemental oxygen  therapy - Goal SpO2 88%  Smoking Cessation Patient continues to smoke 5-6 cigarettes per day. Discussed the impact of continued  smoking on COPD progression and overall health. -Encouraged to continue efforts to quit smoking.  Follow up in 6 months.   Dorn Chill, MD [facility] Office: [phone]   Current Outpatient Medications:    albuterol  (PROVENTIL ) (2.5 MG/3ML) 0.083% nebulizer solution, Take 3 mLs (2.5 mg total) by nebulization every 4 (four) hours. (Patient taking differently: Take 2.5 mg by nebulization every 4 (four) hours as needed for wheezing or shortness of breath (COPD).), Disp: 540 mL, Rfl: 11   albuterol  (VENTOLIN  HFA) 108 (90 Base) MCG/ACT inhaler, Inhale 2 puffs into the lungs every 6 (six) hours as needed for wheezing or shortness of breath.,  Disp: 18 g, Rfl: 11   amLODipine  (NORVASC ) 10 MG tablet, Take 1 tablet (10 mg total) by mouth daily. Needs appt, Disp: 30 tablet, Rfl: 0   aspirin  EC 81 MG tablet, Take 1 tablet (81 mg total) by mouth daily. Swallow whole., Disp: , Rfl:    atorvastatin  (LIPITOR) 20 MG tablet, Take 1 tablet (20 mg total) by mouth daily., Disp: 90 tablet, Rfl: 3   azithromycin  (ZITHROMAX ) 250 MG tablet, Take 1 tablet (250 mg total) by mouth daily., Disp: 30 tablet, Rfl: 5   BREZTRI  AEROSPHERE 160-9-4.8 MCG/ACT AERO, INHALE 2 PUFFS INTO THE LUNGS IN THE MORNING AND AT BEDTIME, Disp: 10.7 g, Rfl: 5   Ensifentrine  (OHTUVAYRE ) 3 MG/2.5ML SUSP, Inhale 3 mg into the lungs in the morning and at bedtime., Disp: 450 mL, Rfl: 1   losartan  (COZAAR ) 100 MG tablet, Take 1 tablet (100 mg total) by mouth daily. Needs appt, Disp: 30 tablet, Rfl: 0   nicotine  (NICODERM CQ  - DOSED IN MG/24 HOURS) 21 mg/24hr patch, Place 21 mg onto the skin daily., Disp: , Rfl:    omeprazole  (PRILOSEC) 20 MG capsule, TAKE 1 CAPSULE(20 MG) BY MOUTH DAILY, Disp: 90 capsule, Rfl: 2   OXYGEN , Inhale 2 L/min into the lungs continuous., Disp: , Rfl:    sildenafil  (REVATIO ) 20 MG tablet, 3-5 tab prior to sex.(Max use once in 24 hour period), Disp: 50 tablet, Rfl: 0   tamsulosin  (FLOMAX ) 0.4 MG CAPS capsule, Take 0.4 mg by mouth in the morning., Disp: , Rfl:    timolol (TIMOPTIC) 0.5 % ophthalmic solution, Place 1 drop into the right eye daily., Disp: , Rfl:    traMADol  (ULTRAM ) 50 MG tablet, Take 1 tablet (50 mg total) by mouth every 6 (six) hours as needed for severe pain (pain score 7-10). (Patient not taking: Reported on 04/15/2024), Disp: 20 tablet, Rfl: 0

## 2024-05-17 ENCOUNTER — Encounter: Payer: Self-pay | Admitting: Pulmonary Disease

## 2024-05-19 ENCOUNTER — Other Ambulatory Visit: Payer: Self-pay | Admitting: Medical

## 2024-05-20 DIAGNOSIS — K5090 Crohn's disease, unspecified, without complications: Secondary | ICD-10-CM | POA: Diagnosis not present

## 2024-05-20 DIAGNOSIS — E785 Hyperlipidemia, unspecified: Secondary | ICD-10-CM | POA: Diagnosis not present

## 2024-05-20 DIAGNOSIS — I739 Peripheral vascular disease, unspecified: Secondary | ICD-10-CM | POA: Diagnosis not present

## 2024-05-20 DIAGNOSIS — J449 Chronic obstructive pulmonary disease, unspecified: Secondary | ICD-10-CM | POA: Diagnosis not present

## 2024-05-20 DIAGNOSIS — I10 Essential (primary) hypertension: Secondary | ICD-10-CM | POA: Diagnosis not present

## 2024-05-20 DIAGNOSIS — Z48812 Encounter for surgical aftercare following surgery on the circulatory system: Secondary | ICD-10-CM | POA: Diagnosis not present

## 2024-05-21 DIAGNOSIS — J441 Chronic obstructive pulmonary disease with (acute) exacerbation: Secondary | ICD-10-CM | POA: Diagnosis not present

## 2024-05-21 DIAGNOSIS — Z515 Encounter for palliative care: Secondary | ICD-10-CM | POA: Diagnosis not present

## 2024-05-21 DIAGNOSIS — N401 Enlarged prostate with lower urinary tract symptoms: Secondary | ICD-10-CM | POA: Diagnosis not present

## 2024-05-21 DIAGNOSIS — J9611 Chronic respiratory failure with hypoxia: Secondary | ICD-10-CM | POA: Diagnosis not present

## 2024-05-21 DIAGNOSIS — J432 Centrilobular emphysema: Secondary | ICD-10-CM | POA: Diagnosis not present

## 2024-05-24 ENCOUNTER — Other Ambulatory Visit: Payer: Self-pay | Admitting: Medical

## 2024-05-29 DIAGNOSIS — Z48812 Encounter for surgical aftercare following surgery on the circulatory system: Secondary | ICD-10-CM | POA: Diagnosis not present

## 2024-05-29 DIAGNOSIS — K5090 Crohn's disease, unspecified, without complications: Secondary | ICD-10-CM | POA: Diagnosis not present

## 2024-05-29 DIAGNOSIS — E785 Hyperlipidemia, unspecified: Secondary | ICD-10-CM | POA: Diagnosis not present

## 2024-05-29 DIAGNOSIS — J449 Chronic obstructive pulmonary disease, unspecified: Secondary | ICD-10-CM | POA: Diagnosis not present

## 2024-05-29 DIAGNOSIS — I10 Essential (primary) hypertension: Secondary | ICD-10-CM | POA: Diagnosis not present

## 2024-05-29 DIAGNOSIS — I739 Peripheral vascular disease, unspecified: Secondary | ICD-10-CM | POA: Diagnosis not present

## 2024-06-04 ENCOUNTER — Telehealth: Payer: Self-pay

## 2024-06-04 NOTE — Telephone Encounter (Signed)
 Copied from CRM [id number]. Topic: Clinical - Medication Question >> Jun 04, 2024  4:08 PM Chiquita SQUIBB wrote: Reason for CRM: Patient is asking for a prescription for the Covid shot and also the high dose flu vaccine, patient is requesting this go to [facility] on [address] so he can get them both done there. Please advise patient.

## 2024-06-05 ENCOUNTER — Telehealth: Payer: Self-pay

## 2024-06-05 ENCOUNTER — Other Ambulatory Visit: Payer: Self-pay

## 2024-06-05 MED ORDER — AMLODIPINE BESYLATE 10 MG PO TABS: 10.0000 mg | ORAL_TABLET | Freq: Every day | ORAL | 0 refills | Status: DC

## 2024-06-05 NOTE — Telephone Encounter (Signed)
 Pt called back, made aware. [facility] agent is scheduling him a visit w/ PCP.

## 2024-06-05 NOTE — Telephone Encounter (Signed)
 Called pt to notify him I can give him a refill of only 30 days but pt needs at apt   Copied from CRM #[id number]. Topic: Clinical - Medication Question >> Jun 04, 2024  4:10 PM Chiquita SQUIBB wrote: Reason for CRM: Patient is requesting a 90 day supply on the amLODipine  (NORVASC ) 10 MG tablet, to go to [facility],  please advise.

## 2024-06-06 DIAGNOSIS — Z48812 Encounter for surgical aftercare following surgery on the circulatory system: Secondary | ICD-10-CM | POA: Diagnosis not present

## 2024-06-06 DIAGNOSIS — I739 Peripheral vascular disease, unspecified: Secondary | ICD-10-CM | POA: Diagnosis not present

## 2024-06-06 DIAGNOSIS — E785 Hyperlipidemia, unspecified: Secondary | ICD-10-CM | POA: Diagnosis not present

## 2024-06-06 DIAGNOSIS — J449 Chronic obstructive pulmonary disease, unspecified: Secondary | ICD-10-CM | POA: Diagnosis not present

## 2024-06-06 DIAGNOSIS — I10 Essential (primary) hypertension: Secondary | ICD-10-CM | POA: Diagnosis not present

## 2024-06-06 DIAGNOSIS — K5090 Crohn's disease, unspecified, without complications: Secondary | ICD-10-CM | POA: Diagnosis not present

## 2024-06-07 MED ORDER — COVID-19 MRNA VAC-TRIS(PFIZER) 30 MCG/0.3ML IM SUSY: 0.3000 mL | PREFILLED_SYRINGE | Freq: Once | INTRAMUSCULAR | 0 refills | Status: AC

## 2024-06-07 NOTE — Addendum Note (Signed)
 Addended by: Derel Mcglasson D on: 06/07/2024 08:56 AM   Modules accepted: Orders

## 2024-06-10 ENCOUNTER — Ambulatory Visit: Admitting: Medical

## 2024-06-12 ENCOUNTER — Encounter: Payer: Self-pay | Admitting: Medical

## 2024-06-12 ENCOUNTER — Ambulatory Visit (INDEPENDENT_AMBULATORY_CARE_PROVIDER_SITE_OTHER): Admitting: Medical

## 2024-06-12 VITALS — BP 120/70 | HR 72 | Temp 97.9°F | Resp 15 | Ht 66.0 in | Wt 138.4 lb

## 2024-06-12 DIAGNOSIS — N529 Male erectile dysfunction, unspecified: Secondary | ICD-10-CM

## 2024-06-12 DIAGNOSIS — E785 Hyperlipidemia, unspecified: Secondary | ICD-10-CM

## 2024-06-12 DIAGNOSIS — R739 Hyperglycemia, unspecified: Secondary | ICD-10-CM | POA: Diagnosis not present

## 2024-06-12 DIAGNOSIS — Z23 Encounter for immunization: Secondary | ICD-10-CM

## 2024-06-12 DIAGNOSIS — I10 Essential (primary) hypertension: Secondary | ICD-10-CM

## 2024-06-12 DIAGNOSIS — E876 Hypokalemia: Secondary | ICD-10-CM | POA: Diagnosis not present

## 2024-06-12 DIAGNOSIS — D649 Anemia, unspecified: Secondary | ICD-10-CM

## 2024-06-12 DIAGNOSIS — J449 Chronic obstructive pulmonary disease, unspecified: Secondary | ICD-10-CM

## 2024-06-12 DIAGNOSIS — E538 Deficiency of other specified B group vitamins: Secondary | ICD-10-CM | POA: Diagnosis not present

## 2024-06-12 MED ORDER — SILDENAFIL CITRATE 20 MG PO TABS: ORAL_TABLET | ORAL | 0 refills | Status: AC

## 2024-06-12 NOTE — Progress Notes (Signed)
 Subjective:    Patient ID: Martin Santiago, male    DOB: 1950-10-31, 73 y.o.   MRN: [id number]  HPI    Patient Instructions  1. B12 deficiency Get level first and then give b12 IM. - B12   2. Anemia, unspecified type Follow labs. If again anemic and low iron  would reconsider your declining of gi referral.  - CBC w/Diff - Iron , TIBC and Ferritin Panel   3. Hypertension, unspecified type well controlled on losartan  and amlodipine . - Comp Met (CMET)   4. Chronic obstructive pulmonary disease, unspecified COPD type (HCC)  Continue breztri  and albuterol . Keep pulmonologist appt. Stay indoors. Walk dog early am or lat pm. Heat may effect breathing. If worsening can start taper medrol  and try to get you quicker with pulmonologist. Keep August appt.     5.For high cholesterol continue atorvastatin .     Martin Santiago is a 73 year old male with emphysema and aortic aneurysm who presents for follow-up after recent surgery.  He underwent surgery for an aortic aneurysm discovered incidentally during a CT chest scan on August 20th. The aneurysm measured 9.8 by 8.7 cm, and surgery was performed promptly. He has experienced a smooth recovery with no pain and did not require prescribed pain medication. His primary concern was whether his breathing would be affected post-surgery, but he reports that it went smoothly.  He has a history of emphysema and is currently on oxygen  therapy, initially prescribed for use during sleep but has been using it more frequently due to hot weather. He experiences difficulty breathing in cold environments, such as a grocery store. He is on 2 liters of oxygen  and continues to use Breztri  and albuterol .  He has a history of hypertension and is currently taking losartan  and amlodipine . He also takes atorvastatin  for cholesterol management.  He has a history of anemia and low B12 levels. He recalls being prescribed iron  tablets in 2020 but did not take them  regularly due to side effects. His anemia was noted to have fluctuated around the time of his surgery.  He is on sildenafil  (Revatio ) 20 mg, which is filled at [facility]. He previously had prescriptions filled at [facility] but has since changed due to convenience.  He has not yet received his flu vaccine. He is also considering the COVID vaccine.    Review of Systems  Constitutional:  Negative for chills, fatigue and fever.  HENT:  Negative for congestion and ear pain.   Respiratory:  Negative for cough, chest tightness and wheezing.   Cardiovascular:  Negative for chest pain and palpitations.  Gastrointestinal:  Negative for abdominal pain, blood in stool, nausea and vomiting.  Genitourinary:  Negative for dysuria and flank pain.  Musculoskeletal:  Negative for back pain, myalgias and neck stiffness.  Skin:  Negative for rash.  Neurological:  Negative for dizziness, speech difficulty, weakness and headaches.  Hematological:  Negative for adenopathy.  Psychiatric/Behavioral:  Negative for behavioral problems and decreased concentration.     Past Medical History:  Diagnosis Date   AAA (abdominal aortic aneurysm) (HCC)    9.8 cm 04/10/24 CTA   Closed C1 fracture (HCC) 05/28/2023   Colon polyps    COPD (chronic obstructive pulmonary disease) (HCC)    Crohn's disease (HCC)    Detached retina    Essential hypertension    Tropical sprue      Social History   Socioeconomic History   Marital status: Widowed    Spouse name: Not on file  Number of children: 0   Years of education: Not on file   Highest education level: Bachelor's degree (e.g., BA, AB, BS)  Occupational History   Occupation: retired, Counsellor, Engineer, agricultural: SELF EMPLOYED  Tobacco Use   Smoking status: Every Day    Current packs/day: 2.00    Average packs/day: 2.0 packs/day for 55.0 years (110.0 ttl pk-yrs)    Types: Cigarettes   Smokeless tobacco: Never   Tobacco comments:    Smoke 1/4  pack a day. Martin Santiago 04/19/22  Vaping Use   Vaping status: Never Used  Substance and Sexual Activity   Alcohol use: Not Currently    Comment: Rare, 3-4 per yr   Drug use: No    Comment: Remote marijuana   Sexual activity: Not Currently  Other Topics Concern   Not on file  Social History Narrative   Wife, Martin Santiago passed away in 12/23/21 from [place].   1 step-daughter who lives in [place].   Social Drivers of Corporate investment banker Strain: Low Risk  (01/18/2024)   Overall Santiago Resource Strain (CARDIA)    Difficulty of Paying Living Expenses: Not hard at all  Food Insecurity: No Food Insecurity (04/15/2024)   Hunger Vital Sign    Worried About Running Out of Food in the Last Year: Never true    Ran Out of Food in the Last Year: Never true  Transportation Needs: No Transportation Needs (04/15/2024)   PRAPARE - Administrator, Civil Service (Medical): No    Lack of Transportation (Non-Medical): No  Physical Activity: Inactive (01/18/2024)   Exercise Vital Sign    Days of Exercise per Week: 0 days    Minutes of Exercise per Session: 0 min  Stress: No Stress Concern Present (01/18/2024)   Martin Santiago of Occupational Health - Occupational Stress Questionnaire    Feeling of Stress : Not at all  Social Connections: Moderately Isolated (04/12/2024)   Social Connection and Isolation Panel    Frequency of Communication with Friends and Family: More than three times a week    Frequency of Social Gatherings with Friends and Family: More than three times a week    Attends Religious Services: 1 to 4 times per year    Active Member of Martin Santiago or Organizations: No    Attends Banker Meetings: Never    Marital Status: Widowed  Intimate Partner Violence: Not At Risk (04/15/2024)   Humiliation, Afraid, Rape, and Kick questionnaire    Fear of Current or Ex-Partner: No    Emotionally Abused: No    Physically Abused: No    Sexually Abused: No    Past  Surgical History:  Procedure Laterality Date   ABDOMINAL AORTIC ENDOVASCULAR STENT GRAFT N/A 04/12/2024   Procedure: INSERTION, ENDOVASCULAR STENT GRAFT, AORTA, ABDOMINAL;  Surgeon: Sheree Penne Bruckner, MD;  Location: [facility] OR;  Service: Vascular;  Laterality: N/A;   BOWEL RESECTION  05/27/1994   [place]:  ? Crohn's   COLONOSCOPY     Dr Karena, [facility]:  Hx polyps. Cannot remmeber date   COLONOSCOPY N/A 12/03/2012   Procedure: COLONOSCOPY;  Surgeon: Lamar CHRISTELLA Hollingshead, MD;  Location[facility];  Service: Endoscopy;  Laterality: N/A;  7:30   INGUINAL HERNIA REPAIR Right    REPAIR ILIAC ARTERY Right 04/12/2024   Procedure: REPAIR, ARTERY, FEMORAL;  Surgeon: Sheree Penne Bruckner, MD;  Location: [facility] OR;  Service: Vascular;  Laterality: Right;   RETINAL DETACHMENT SURGERY  07/27/2006   RETINAL DETACHMENT SURGERY  09/26/2006   RETINAL DETACHMENT SURGERY  01/25/2007   TONSILLECTOMY     ULTRASOUND GUIDANCE FOR VASCULAR ACCESS Bilateral 04/12/2024   Procedure: ULTRASOUND GUIDANCE, FOR VASCULAR ACCESS;  Surgeon: Sheree Penne Bruckner, MD;  Location[facility] OR;  Service: Vascular;  Laterality: Bilateral;    Family History  Problem Relation Age of Onset   Colon cancer Maternal Aunt    Breast cancer Mother     Allergies  Allergen Reactions   Codeine Nausea And Vomiting and Other (See Comments)    Other reaction(s): Flushing (ALLERGY/intolerance) fainting    Current Outpatient Medications on File Prior to Visit  Medication Sig Dispense Refill   albuterol  (PROVENTIL ) (2.5 MG/3ML) 0.083% nebulizer solution Take 3 mLs (2.5 mg total) by nebulization every 4 (four) hours. (Patient taking differently: Take 2.5 mg by nebulization every 4 (four) hours as needed for wheezing or shortness of breath (COPD).) 540 mL 11   albuterol  (VENTOLIN  HFA) 108 (90 Base) MCG/ACT inhaler Inhale 2 puffs into the lungs every 6 (six) hours as needed for wheezing or shortness of breath. 18 g 11   amLODipine   (NORVASC ) 10 MG tablet Take 1 tablet (10 mg total) by mouth daily. Needs appt 30 tablet 0   aspirin  EC 81 MG tablet Take 1 tablet (81 mg total) by mouth daily. Swallow whole.     atorvastatin  (LIPITOR) 20 MG tablet Take 1 tablet (20 mg total) by mouth daily. 90 tablet 3   azithromycin  (ZITHROMAX ) 250 MG tablet Take 1 tablet (250 mg total) by mouth daily. 30 tablet 5   BREZTRI  AEROSPHERE 160-9-4.8 MCG/ACT AERO INHALE 2 PUFFS INTO THE LUNGS IN THE MORNING AND AT BEDTIME 10.7 g 5   Ensifentrine  (OHTUVAYRE ) 3 MG/2.5ML SUSP Inhale 3 mg into the lungs in the morning and at bedtime. 450 mL 1   losartan  (COZAAR ) 100 MG tablet TAKE 1 TABLET BY MOUTH DAILY 90 tablet 1   nicotine  (NICODERM CQ  - DOSED IN MG/24 HOURS) 21 mg/24hr patch Place 21 mg onto the skin daily.     omeprazole  (PRILOSEC) 20 MG capsule TAKE 1 CAPSULE(20 MG) BY MOUTH DAILY 90 capsule 2   OXYGEN  Inhale 2 L/min into the lungs continuous.     tamsulosin  (FLOMAX ) 0.4 MG CAPS capsule Take 0.4 mg by mouth in the morning.     timolol (TIMOPTIC) 0.5 % ophthalmic solution Place 1 drop into the right eye daily.     No current facility-administered medications on file prior to visit.    BP 120/70   Pulse 72   Temp 97.9 F (36.6 C) (Oral)   Resp 15   Ht 5' 6 (1.676 m)   Wt 138 lb 6.4 oz (62.8 kg)   SpO2 92%   BMI 22.34 kg/m             Objective:   Physical Exam  General- No acute distress. Pleasant patient. Neck- Full range of motion, no jvd Lungs- Clear, even and unlabored. Heart- regular rate and rhythm. Neurologic- CNII- XII grossly intact.       Assessment & Plan:    Patient Instructions  COPD with chronic hypoxemia requiring supplemental oxygen  Moderate to severe emphysema with chronic hypoxemia, requiring supplemental oxygen , especially during extreme weather conditions. - Continue supplemental oxygen , Breztri , and albuterol .  Pulmonary nodules and subpleural lymph nodes Pulmonary nodules and subpleural lymph  nodes identified on recent CT scan. - Follow-up CT scan in six months to monitor for changes.  Hypertension  Hypertension managed with losartan  and amlodipine .  Bp better on repeat. - Continue losartan  and amlodipine .  Hyperlipidemia Hyperlipidemia managed with atorvastatin . Previous LDL elevation noted, but atorvastatin  provides a protective effect given his history of smoking and cholesterol levels. - Continue atorvastatin . - Order lipid panel.  Anemia with iron  deficiency and vitamin B12 deficiency Anemia with iron  deficiency and vitamin B12 deficiency. Iron  levels have fluctuated in the past, and B12 was previously low. Constipation from iron  supplements noted as a concern. - Order future labs for iron  levels and B12.  Mild hyperglycemia Mild hyperglycemia with slightly elevated sugars. - Order future A1c.  Hypokalemia, resolved or under monitoring Previous hypokalemia noted, but current status requires monitoring to ensure resolution. - Order future metabolic panel to check potassium levels.  Follow up date to be determined after lab review   Dallas Maxwell, PA-C   I personally spent a total of 45 minutes in the care of the patient today including performing a medically appropriate exam/evaluation, counseling and educating, placing orders, and documenting clinical information in the EHR.

## 2024-06-12 NOTE — Patient Instructions (Signed)
 COPD with chronic hypoxemia requiring supplemental oxygen  Moderate to severe emphysema with chronic hypoxemia, requiring supplemental oxygen , especially during extreme weather conditions. - Continue supplemental oxygen , Breztri , and albuterol .  Pulmonary nodules and subpleural lymph nodes Pulmonary nodules and subpleural lymph nodes identified on recent CT scan. - Follow-up CT scan in six months to monitor for changes.  Hypertension Hypertension managed with losartan  and amlodipine .  Bp better on repeat. - Continue losartan  and amlodipine .  Hyperlipidemia Hyperlipidemia managed with atorvastatin . Previous LDL elevation noted, but atorvastatin  provides a protective effect given his history of smoking and cholesterol levels. - Continue atorvastatin . - Order lipid panel.  Anemia with iron  deficiency and vitamin B12 deficiency Anemia with iron  deficiency and vitamin B12 deficiency. Iron  levels have fluctuated in the past, and B12 was previously low. Constipation from iron  supplements noted as a concern. - Order future labs for iron  levels and B12.  Mild hyperglycemia Mild hyperglycemia with slightly elevated sugars. - Order future A1c.  Hypokalemia, resolved or under monitoring Previous hypokalemia noted, but current status requires monitoring to ensure resolution. - Order future metabolic panel to check potassium levels.  Follow up date to be determined after lab review

## 2024-06-13 DIAGNOSIS — Z23 Encounter for immunization: Secondary | ICD-10-CM | POA: Diagnosis not present

## 2024-06-24 ENCOUNTER — Other Ambulatory Visit: Payer: Self-pay | Admitting: Pulmonary Disease

## 2024-06-24 ENCOUNTER — Other Ambulatory Visit: Payer: Self-pay | Admitting: Medical

## 2024-06-24 DIAGNOSIS — Z515 Encounter for palliative care: Secondary | ICD-10-CM | POA: Diagnosis not present

## 2024-06-24 DIAGNOSIS — J432 Centrilobular emphysema: Secondary | ICD-10-CM

## 2024-06-24 DIAGNOSIS — J9611 Chronic respiratory failure with hypoxia: Secondary | ICD-10-CM | POA: Diagnosis not present

## 2024-06-24 DIAGNOSIS — F17200 Nicotine dependence, unspecified, uncomplicated: Secondary | ICD-10-CM | POA: Diagnosis not present

## 2024-06-24 MED ORDER — AMLODIPINE BESYLATE 10 MG PO TABS: 10.0000 mg | ORAL_TABLET | Freq: Every day | ORAL | 0 refills | Status: DC

## 2024-06-24 NOTE — Telephone Encounter (Signed)
 Copied from CRM #[id number]. Topic: Clinical - Medication Refill >> Jun 24, 2024 11:48 AM Maisie C wrote: Medication: losartan  & amlodipine    Has the patient contacted their pharmacy? Yes They only have 30 day supply, pt stated it should be 90 days  This is the patient's preferred pharmacy:  [facility] [place], [place] - [address] [place] [address] Phone: [phone] Fax: [phone]   Is this the correct pharmacy for this prescription? Yes If no, delete pharmacy and type the correct one.   Has the prescription been filled recently? Yes  Is the patient out of the medication? No  Has the patient been seen for an appointment in the last year OR does the patient have an upcoming appointment? No  Can we respond through MyChart? Yes  Agent: Please be advised that Rx refills may take up to 3 business days. We ask that you follow-up with your pharmacy.

## 2024-06-27 ENCOUNTER — Other Ambulatory Visit: Payer: Self-pay | Admitting: Medical

## 2024-07-10 ENCOUNTER — Encounter: Payer: Self-pay | Admitting: Pulmonary Disease

## 2024-07-18 NOTE — Telephone Encounter (Signed)
 Can you advise if this is done?

## 2024-07-19 ENCOUNTER — Telehealth: Payer: Self-pay

## 2024-07-19 NOTE — Telephone Encounter (Signed)
 Spoke w/ PT papers have been faxed and copy has been mailed to patient

## 2024-07-25 ENCOUNTER — Other Ambulatory Visit: Payer: Self-pay | Admitting: Medical

## 2024-08-05 ENCOUNTER — Other Ambulatory Visit: Payer: Self-pay | Admitting: Cardiology

## 2024-08-08 ENCOUNTER — Telehealth: Payer: Self-pay | Admitting: Pulmonary Disease

## 2024-08-08 DIAGNOSIS — J441 Chronic obstructive pulmonary disease with (acute) exacerbation: Secondary | ICD-10-CM

## 2024-08-09 MED ORDER — AZITHROMYCIN 250 MG PO TABS: 250.0000 mg | ORAL_TABLET | Freq: Every day | ORAL | 5 refills | Status: AC

## 2024-08-09 NOTE — Telephone Encounter (Signed)
 Rx sent to pharmacy & pt is aware. Nothing further needed.

## 2024-08-28 ENCOUNTER — Other Ambulatory Visit: Payer: Self-pay | Admitting: Cardiology

## 2024-09-03 DIAGNOSIS — H6121 Impacted cerumen, right ear: Secondary | ICD-10-CM | POA: Diagnosis not present

## 2024-09-07 ENCOUNTER — Encounter (HOSPITAL_COMMUNITY): Payer: Self-pay

## 2024-09-07 ENCOUNTER — Inpatient Hospital Stay (HOSPITAL_COMMUNITY)
Admission: EM | Admit: 2024-09-07 | Discharge: 2024-09-10 | Disposition: A | Source: Home / Self Care | Attending: Internal Medicine | Admitting: Internal Medicine

## 2024-09-07 ENCOUNTER — Emergency Department (HOSPITAL_COMMUNITY)

## 2024-09-07 ENCOUNTER — Other Ambulatory Visit: Payer: Self-pay

## 2024-09-07 DIAGNOSIS — J441 Chronic obstructive pulmonary disease with (acute) exacerbation: Secondary | ICD-10-CM | POA: Diagnosis present

## 2024-09-07 DIAGNOSIS — E860 Dehydration: Secondary | ICD-10-CM | POA: Diagnosis present

## 2024-09-07 DIAGNOSIS — R000 Tachycardia, unspecified: Secondary | ICD-10-CM | POA: Diagnosis not present

## 2024-09-07 DIAGNOSIS — Z79899 Other long term (current) drug therapy: Secondary | ICD-10-CM | POA: Diagnosis not present

## 2024-09-07 DIAGNOSIS — D649 Anemia, unspecified: Secondary | ICD-10-CM | POA: Diagnosis present

## 2024-09-07 DIAGNOSIS — Z860100 Personal history of colon polyps, unspecified: Secondary | ICD-10-CM | POA: Diagnosis not present

## 2024-09-07 DIAGNOSIS — Z602 Problems related to living alone: Secondary | ICD-10-CM | POA: Diagnosis present

## 2024-09-07 DIAGNOSIS — J101 Influenza due to other identified influenza virus with other respiratory manifestations: Secondary | ICD-10-CM | POA: Diagnosis present

## 2024-09-07 DIAGNOSIS — R0689 Other abnormalities of breathing: Secondary | ICD-10-CM | POA: Diagnosis not present

## 2024-09-07 DIAGNOSIS — I771 Stricture of artery: Secondary | ICD-10-CM | POA: Diagnosis not present

## 2024-09-07 DIAGNOSIS — Z9981 Dependence on supplemental oxygen: Secondary | ICD-10-CM | POA: Diagnosis not present

## 2024-09-07 DIAGNOSIS — Z8679 Personal history of other diseases of the circulatory system: Secondary | ICD-10-CM | POA: Diagnosis not present

## 2024-09-07 DIAGNOSIS — R739 Hyperglycemia, unspecified: Secondary | ICD-10-CM | POA: Diagnosis present

## 2024-09-07 DIAGNOSIS — M40209 Unspecified kyphosis, site unspecified: Secondary | ICD-10-CM | POA: Diagnosis present

## 2024-09-07 DIAGNOSIS — J9601 Acute respiratory failure with hypoxia: Secondary | ICD-10-CM | POA: Diagnosis present

## 2024-09-07 DIAGNOSIS — F17210 Nicotine dependence, cigarettes, uncomplicated: Secondary | ICD-10-CM | POA: Diagnosis present

## 2024-09-07 DIAGNOSIS — Z803 Family history of malignant neoplasm of breast: Secondary | ICD-10-CM | POA: Diagnosis not present

## 2024-09-07 DIAGNOSIS — R0602 Shortness of breath: Secondary | ICD-10-CM | POA: Diagnosis not present

## 2024-09-07 DIAGNOSIS — H4010X0 Unspecified open-angle glaucoma, stage unspecified: Secondary | ICD-10-CM | POA: Diagnosis present

## 2024-09-07 DIAGNOSIS — I10 Essential (primary) hypertension: Secondary | ICD-10-CM | POA: Diagnosis present

## 2024-09-07 DIAGNOSIS — R059 Cough, unspecified: Secondary | ICD-10-CM | POA: Diagnosis not present

## 2024-09-07 DIAGNOSIS — I2510 Atherosclerotic heart disease of native coronary artery without angina pectoris: Secondary | ICD-10-CM | POA: Diagnosis present

## 2024-09-07 DIAGNOSIS — I700 Atherosclerosis of aorta: Secondary | ICD-10-CM | POA: Diagnosis not present

## 2024-09-07 DIAGNOSIS — Z885 Allergy status to narcotic agent status: Secondary | ICD-10-CM | POA: Diagnosis not present

## 2024-09-07 DIAGNOSIS — Z800 Family history of malignant neoplasm of digestive organs: Secondary | ICD-10-CM | POA: Diagnosis not present

## 2024-09-07 DIAGNOSIS — K5090 Crohn's disease, unspecified, without complications: Secondary | ICD-10-CM | POA: Diagnosis present

## 2024-09-07 DIAGNOSIS — Z7982 Long term (current) use of aspirin: Secondary | ICD-10-CM | POA: Diagnosis not present

## 2024-09-07 DIAGNOSIS — R069 Unspecified abnormalities of breathing: Secondary | ICD-10-CM | POA: Diagnosis not present

## 2024-09-07 DIAGNOSIS — E785 Hyperlipidemia, unspecified: Secondary | ICD-10-CM | POA: Diagnosis present

## 2024-09-07 DIAGNOSIS — R509 Fever, unspecified: Secondary | ICD-10-CM | POA: Diagnosis not present

## 2024-09-07 DIAGNOSIS — J439 Emphysema, unspecified: Secondary | ICD-10-CM | POA: Diagnosis not present

## 2024-09-07 DIAGNOSIS — Z720 Tobacco use: Secondary | ICD-10-CM | POA: Diagnosis present

## 2024-09-07 DIAGNOSIS — H409 Unspecified glaucoma: Secondary | ICD-10-CM | POA: Diagnosis present

## 2024-09-07 LAB — PHOSPHORUS: Phosphorus: 3.3 mg/dL (ref 2.5–4.6)

## 2024-09-07 LAB — CBC
HCT: 37.9 % — ABNORMAL LOW (ref 39.0–52.0)
Hemoglobin: 11.8 g/dL — ABNORMAL LOW (ref 13.0–17.0)
MCH: 25.2 pg — ABNORMAL LOW (ref 26.0–34.0)
MCHC: 31.1 g/dL (ref 30.0–36.0)
MCV: 80.8 fL (ref 80.0–100.0)
Platelets: 176 K/uL (ref 150–400)
RBC: 4.69 MIL/uL (ref 4.22–5.81)
RDW: 18 % — ABNORMAL HIGH (ref 11.5–15.5)
WBC: 6.7 K/uL (ref 4.0–10.5)
nRBC: 0 % (ref 0.0–0.2)

## 2024-09-07 LAB — BASIC METABOLIC PANEL WITH GFR
Anion gap: 11 (ref 5–15)
BUN: 15 mg/dL (ref 8–23)
CO2: 26 mmol/L (ref 22–32)
Calcium: 10.4 mg/dL — ABNORMAL HIGH (ref 8.9–10.3)
Chloride: 102 mmol/L (ref 98–111)
Creatinine, Ser: 0.94 mg/dL (ref 0.61–1.24)
GFR, Estimated: >60 mL/min (ref >60)
Glucose, Bld: 135 mg/dL — ABNORMAL HIGH (ref 70–99)
Potassium: 3.7 mmol/L (ref 3.5–5.1)
Sodium: 139 mmol/L (ref 135–145)

## 2024-09-07 LAB — MAGNESIUM: Magnesium: 2.7 mg/dL — ABNORMAL HIGH (ref 1.7–2.4)

## 2024-09-07 LAB — PROCALCITONIN: Procalcitonin: <0.1 ng/mL

## 2024-09-07 LAB — BLOOD GAS, VENOUS
Acid-Base Excess: 4.1 mmol/L — ABNORMAL HIGH (ref 0.0–2.0)
Bicarbonate: 30.2 mmol/L — ABNORMAL HIGH (ref 20.0–28.0)
O2 Saturation: 98.5 %
Patient temperature: 37
pCO2, Ven: 51 mmHg (ref 44–60)
pH, Ven: 7.38 (ref 7.25–7.43)
pO2, Ven: 88 mmHg — ABNORMAL HIGH (ref 32–45)

## 2024-09-07 LAB — RESP PANEL BY RT-PCR (RSV, FLU A&B, COVID)  RVPGX2
Influenza A by PCR: POSITIVE — AB
Influenza B by PCR: NEGATIVE
Resp Syncytial Virus by PCR: NEGATIVE
SARS Coronavirus 2 by RT PCR: NEGATIVE

## 2024-09-07 MED ORDER — ONDANSETRON HCL 4 MG/2ML IJ SOLN: 4.0000 mg | Freq: Four times a day (QID) | INTRAMUSCULAR | Status: DC | PRN

## 2024-09-07 MED ORDER — SODIUM CHLORIDE 0.9 % IV SOLN
1.0000 g | Freq: Once | INTRAVENOUS | Status: AC
  Administered 2024-09-07: 1 g via INTRAVENOUS
  Filled 2024-09-07: qty 10

## 2024-09-07 MED ORDER — NICOTINE 21 MG/24HR TD PT24
21.0000 mg | MEDICATED_PATCH | Freq: Every day | TRANSDERMAL | Status: DC
  Administered 2024-09-07 – 2024-09-10 (×4): 21 mg via TRANSDERMAL
  Filled 2024-09-07 (×4): qty 1

## 2024-09-07 MED ORDER — METHYLPREDNISOLONE SODIUM SUCC 40 MG IJ SOLR
40.0000 mg | Freq: Two times a day (BID) | INTRAMUSCULAR | Status: DC
  Administered 2024-09-07: 40 mg via INTRAVENOUS
  Filled 2024-09-07: qty 1

## 2024-09-07 MED ORDER — ACETAMINOPHEN 325 MG PO TABS: 650.0000 mg | ORAL_TABLET | Freq: Four times a day (QID) | ORAL | Status: DC | PRN

## 2024-09-07 MED ORDER — ATORVASTATIN CALCIUM 10 MG PO TABS
20.0000 mg | ORAL_TABLET | Freq: Every day | ORAL | Status: DC
  Administered 2024-09-07 – 2024-09-10 (×4): 20 mg via ORAL
  Filled 2024-09-07 (×2): qty 1
  Filled 2024-09-07: qty 2
  Filled 2024-09-07: qty 1

## 2024-09-07 MED ORDER — ACETAMINOPHEN 650 MG RE SUPP: 650.0000 mg | Freq: Four times a day (QID) | RECTAL | Status: DC | PRN

## 2024-09-07 MED ORDER — ENOXAPARIN SODIUM 40 MG/0.4ML IJ SOSY
40.0000 mg | PREFILLED_SYRINGE | INTRAMUSCULAR | Status: DC
  Administered 2024-09-07 – 2024-09-09 (×3): 40 mg via SUBCUTANEOUS
  Filled 2024-09-07 (×3): qty 0.4

## 2024-09-07 MED ORDER — SODIUM CHLORIDE 0.9 % IV SOLN: INTRAVENOUS | Status: AC

## 2024-09-07 MED ORDER — SODIUM CHLORIDE 0.9 % IV SOLN
500.0000 mg | INTRAVENOUS | Status: DC
  Administered 2024-09-07: 500 mg via INTRAVENOUS
  Filled 2024-09-07: qty 5

## 2024-09-07 MED ORDER — SODIUM CHLORIDE 0.9 % IV BOLUS
500.0000 mL | Freq: Once | INTRAVENOUS | Status: AC
  Administered 2024-09-07: 500 mL via INTRAVENOUS

## 2024-09-07 MED ORDER — AMLODIPINE BESYLATE 10 MG PO TABS
10.0000 mg | ORAL_TABLET | Freq: Every day | ORAL | Status: DC
  Administered 2024-09-07 – 2024-09-10 (×4): 10 mg via ORAL
  Filled 2024-09-07 (×4): qty 1

## 2024-09-07 MED ORDER — OSELTAMIVIR PHOSPHATE 75 MG PO CAPS
75.0000 mg | ORAL_CAPSULE | Freq: Two times a day (BID) | ORAL | Status: DC
  Administered 2024-09-07 – 2024-09-10 (×7): 75 mg via ORAL
  Filled 2024-09-07 (×7): qty 1

## 2024-09-07 MED ORDER — ALBUTEROL SULFATE (2.5 MG/3ML) 0.083% IN NEBU: 2.5000 mg | INHALATION_SOLUTION | RESPIRATORY_TRACT | Status: DC | PRN

## 2024-09-07 MED ORDER — ONDANSETRON HCL 4 MG PO TABS: 4.0000 mg | ORAL_TABLET | Freq: Four times a day (QID) | ORAL | Status: DC | PRN

## 2024-09-07 MED ORDER — IPRATROPIUM-ALBUTEROL 0.5-2.5 (3) MG/3ML IN SOLN
3.0000 mL | RESPIRATORY_TRACT | Status: DC
  Administered 2024-09-07 – 2024-09-08 (×7): 3 mL via RESPIRATORY_TRACT
  Filled 2024-09-07 (×7): qty 3

## 2024-09-07 MED ORDER — PANTOPRAZOLE SODIUM 40 MG PO TBEC
40.0000 mg | DELAYED_RELEASE_TABLET | Freq: Every day | ORAL | Status: DC
  Administered 2024-09-07: 40 mg via ORAL
  Filled 2024-09-07: qty 1

## 2024-09-07 MED ORDER — PREDNISONE 20 MG PO TABS: 40.0000 mg | ORAL_TABLET | Freq: Every day | ORAL | Status: DC

## 2024-09-07 MED ORDER — TIMOLOL MALEATE 0.5 % OP SOLN
1.0000 [drp] | Freq: Every day | OPHTHALMIC | Status: DC
  Administered 2024-09-07 – 2024-09-10 (×4): 1 [drp] via OPHTHALMIC
  Filled 2024-09-07: qty 5

## 2024-09-07 MED ORDER — LOSARTAN POTASSIUM 25 MG PO TABS
100.0000 mg | ORAL_TABLET | Freq: Every day | ORAL | Status: DC
  Administered 2024-09-07 – 2024-09-10 (×4): 100 mg via ORAL
  Filled 2024-09-07 (×2): qty 2
  Filled 2024-09-07: qty 4
  Filled 2024-09-07: qty 2

## 2024-09-07 MED ORDER — ASPIRIN 81 MG PO TBEC
81.0000 mg | DELAYED_RELEASE_TABLET | Freq: Every day | ORAL | Status: DC
  Administered 2024-09-07 – 2024-09-10 (×4): 81 mg via ORAL
  Filled 2024-09-07 (×4): qty 1

## 2024-09-07 MED ORDER — SODIUM CHLORIDE 0.9 % IV SOLN: 1.0000 g | INTRAVENOUS | Status: DC

## 2024-09-07 MED ORDER — TAMSULOSIN HCL 0.4 MG PO CAPS
0.4000 mg | ORAL_CAPSULE | Freq: Every morning | ORAL | Status: DC
  Administered 2024-09-08 – 2024-09-10 (×3): 0.4 mg via ORAL
  Filled 2024-09-07 (×3): qty 1

## 2024-09-07 NOTE — ED Provider Notes (Signed)
 [place] EMERGENCY DEPARTMENT AT [facility] Provider Note   CSN: [id number] Arrival date & time: 09/07/24  0126     Patient presents with: Shortness of Breath   Martin Santiago is a 73 y.o. male.   The history is provided by the patient.  Shortness of Breath Severity:  Severe Onset quality:  Gradual Timing:  Constant Progression:  Worsening Chronicity:  New Context: not activity   Relieved by:  Nothing Worsened by:  Nothing Ineffective treatments:  Inhaler Associated symptoms: cough and wheezing   Associated symptoms: no fever   Risk factors: tobacco use        Prior to Admission medications  Medication Sig Start Date End Date Taking? Authorizing Provider  albuterol  (PROVENTIL ) (2.5 MG/3ML) 0.083% nebulizer solution Take 3 mLs (2.5 mg total) by nebulization every 4 (four) hours. Patient taking differently: Take 2.5 mg by nebulization every 4 (four) hours as needed for wheezing or shortness of breath (COPD). 05/26/23   Kara Dorn NOVAK, MD  albuterol  (VENTOLIN  HFA) 108 (90 Base) MCG/ACT inhaler INHALE 2 PUFFS into THE lungs EVERY 6 HOURS AS NEEDED FOR wheezing OR SHORTNESS OF BREATH 06/24/24   Kara Dorn NOVAK, MD  amLODipine  (NORVASC ) 10 MG tablet Take 1 tablet (10 mg total) by mouth daily. Needs appt 06/24/24   Saguier, [place], PA-C  aspirin  EC 81 MG tablet Take 1 tablet (81 mg total) by mouth daily. Swallow whole. 05/12/23   Krasowski, Robert J, MD  atorvastatin  (LIPITOR) 20 MG tablet Take 1 tablet (20 mg total) by mouth daily. Patient needs an appointment for further refills. 2 nd attempt 08/28/24   Bernie Lamar PARAS, MD  azithromycin  (ZITHROMAX ) 250 MG tablet Take 1 tablet (250 mg total) by mouth daily. 08/09/24   Kara Dorn NOVAK, MD  BREZTRI  AEROSPHERE 160-9-4.8 MCG/ACT AERO INHALE 2 PUFFS INTO THE LUNGS IN THE MORNING AND AT BEDTIME 08/29/22   Kara Dorn NOVAK, MD  Ensifentrine  (OHTUVAYRE ) 3 MG/2.5ML SUSP Inhale 3 mg into the lungs in the morning and  at bedtime. 04/04/24   Kara Dorn NOVAK, MD  losartan  (COZAAR ) 100 MG tablet TAKE 1 TABLET BY MOUTH ONCE DAILY (need office appointment) 06/24/24   Saguier, [place], PA-C  nicotine  (NICODERM CQ  - DOSED IN MG/24 HOURS) 21 mg/24hr patch Place 21 mg onto the skin daily.    [provider]  omeprazole  (PRILOSEC) 20 MG capsule Take 1 capsule (20 mg total) by mouth daily. 07/25/24   Saguier, [place], PA-C  OXYGEN  Inhale 2 L/min into the lungs continuous.    [provider]  sildenafil  (REVATIO ) 20 MG tablet 3-5 tab prior to sex.(Max use once in 24 hour period) 06/12/24   Saguier, [place], PA-C  tamsulosin  (FLOMAX ) 0.4 MG CAPS capsule Take 0.4 mg by mouth in the morning.    [provider]  timolol  (TIMOPTIC ) 0.5 % ophthalmic solution Place 1 drop into the right eye daily. 06/10/21   [provider]    Allergies: Codeine    Review of Systems  Constitutional:  Negative for fever.  Respiratory:  Positive for cough, shortness of breath and wheezing.   All other systems reviewed and are negative.   Updated Vital Signs BP 130/88   Pulse (!) 114   Temp 97.8 F (36.6 C) (Oral)   Resp (!) 21   SpO2 98%   Physical Exam Vitals and nursing note reviewed.  Constitutional:      General: He is not in acute distress.    Appearance: Normal  appearance. He is well-developed. He is not diaphoretic.  HENT:     Head: Normocephalic and atraumatic.     Nose: Nose normal.  Eyes:     Conjunctiva/sclera: Conjunctivae normal.     Pupils: Pupils are equal, round, and reactive to light.  Cardiovascular:     Rate and Rhythm: Regular rhythm. Tachycardia present.     Pulses: Normal pulses.     Heart sounds: Normal heart sounds.  Pulmonary:     Effort: Pulmonary effort is normal.     Breath sounds: Wheezing and rhonchi present. No rales.  Abdominal:     General: Bowel sounds are normal.     Palpations: Abdomen is soft.     Tenderness: There is no abdominal tenderness. There is  no guarding or rebound.  Musculoskeletal:        General: Normal range of motion.     Cervical back: Normal range of motion and neck supple.  Skin:    General: Skin is warm and dry.     Capillary Refill: Capillary refill takes less than 2 seconds.  Neurological:     General: No focal deficit present.     Mental Status: He is alert and oriented to person, place, and time.     (all labs ordered are listed, but only abnormal results are displayed) Results for orders placed or performed during the hospital encounter of 09/07/24  Basic metabolic panel   Collection Time: 09/07/24  1:47 AM  Result Value Ref Range   Sodium 139 135 - 145 mmol/L   Potassium 3.7 3.5 - 5.1 mmol/L   Chloride 102 98 - 111 mmol/L   CO2 26 22 - 32 mmol/L   Glucose, Bld 135 (H) 70 - 99 mg/dL   BUN 15 8 - 23 mg/dL   Creatinine, Ser 9.05 0.61 - 1.24 mg/dL   Calcium  10.4 (H) 8.9 - 10.3 mg/dL   GFR, Estimated >39 >39 mL/min   Anion gap 11 5 - 15  Resp panel by RT-PCR (RSV, Flu A&B, Covid) Anterior Nasal Swab   Collection Time: 09/07/24  1:58 AM   Specimen: Anterior Nasal Swab  Result Value Ref Range   SARS Coronavirus 2 by RT PCR NEGATIVE NEGATIVE   Influenza A by PCR POSITIVE (A) NEGATIVE   Influenza B by PCR NEGATIVE NEGATIVE   Resp Syncytial Virus by PCR NEGATIVE NEGATIVE  CBC   Collection Time: 09/07/24  3:49 AM  Result Value Ref Range   WBC 6.7 4.0 - 10.5 K/uL   RBC 4.69 4.22 - 5.81 MIL/uL   Hemoglobin 11.8 (L) 13.0 - 17.0 g/dL   HCT 62.0 (L) 60.9 - 47.9 %   MCV 80.8 80.0 - 100.0 fL   MCH 25.2 (L) 26.0 - 34.0 pg   MCHC 31.1 30.0 - 36.0 g/dL   RDW 81.9 (H) 88.4 - 84.4 %   Platelets 176 150 - 400 K/uL   nRBC 0.0 0.0 - 0.2 %   DG Chest 2 View Result Date: 09/07/2024 EXAM: 2 VIEW(S) XRAY OF THE CHEST 09/07/2024 02:05:00 AM COMPARISON: None available. CLINICAL HISTORY: sob FINDINGS: LUNGS AND PLEURA: Background emphysema. No focal pulmonary opacity. No pleural effusion. No pneumothorax. HEART AND  MEDIASTINUM: Tortuous aorta with calcification. Atherosclerosis of the arch. BONES AND SOFT TISSUES: No acute osseous abnormality. IMPRESSION: 1. No acute cardiopulmonary process. 2. Background emphysema. Electronically signed by: Oneil Devonshire MD 09/07/2024 02:08 AM EST RP Workstation: [id number]     EKG: EKG Interpretation Date/Time:  Saturday September 07 2024 01:39:38 EST Ventricular Rate:  129 PR Interval:  147 QRS Duration:  84 QT Interval:  304 QTC Calculation: 446 R Axis:   258  Text Interpretation: Sinus tachycardia Atrial premature complexes Confirmed by Dryden Tapley ([id number]) on 09/07/2024 2:03:53 AM  Radiology: [facility] Chest 2 View Result Date: 09/07/2024 EXAM: 2 VIEW(S) XRAY OF THE CHEST 09/07/2024 02:05:00 AM COMPARISON: None available. CLINICAL HISTORY: sob FINDINGS: LUNGS AND PLEURA: Background emphysema. No focal pulmonary opacity. No pleural effusion. No pneumothorax. HEART AND MEDIASTINUM: Tortuous aorta with calcification. Atherosclerosis of the arch. BONES AND SOFT TISSUES: No acute osseous abnormality. IMPRESSION: 1. No acute cardiopulmonary process. 2. Background emphysema. Electronically signed by: Oneil Devonshire MD 09/07/2024 02:08 AM EST RP Workstation: [id number]     .Critical Care  Performed by: Nettie Earing, MD Authorized by: Nettie Earing, MD   Critical care provider statement:    Critical care time (minutes):  30   Critical care end time:  09/07/2024 4:28 AM   Critical care was necessary to treat or prevent imminent or life-threatening deterioration of the following conditions:  Respiratory failure   Critical care was time spent personally by me on the following activities:  Development of treatment plan with patient or surrogate, discussions with consultants, evaluation of patient's response to treatment, examination of patient, ordering and review of laboratory studies, ordering and review of radiographic studies, ordering and performing treatments and  interventions, pulse oximetry, re-evaluation of patient's condition and review of old charts   Care discussed with: admitting provider      Medications Ordered in the ED  cefTRIAXone  (ROCEPHIN ) 1 g in sodium chloride  0.9 % 100 mL IVPB (has no administration in time range)  sodium chloride  0.9 % bolus 500 mL (500 mLs Intravenous New Bag/Given 09/07/24 0211)                                    Medical Decision Making Patient with wheezing and SOB  Amount and/or Complexity of Data Reviewed Independent Historian: EMS    Details: See above  External Data Reviewed: notes.    Details: Previous notes removed  Labs: ordered.    Details: fluA is positive.  COVID is negative.  Normal white count 6.7, normal hemoglobin normal platelets.  Normal sodium 139, normal potassium 3.7, normal creatinine  Radiology: ordered and independent interpretation performed.    Details: NO CHF by me  ECG/medicine tests: ordered and independent interpretation performed. Decision-making details documented in ED Course.  Risk Decision regarding hospitalization. Risk Details: Bipap initiated      Final diagnoses:  COPD exacerbation (HCC)  Influenza A (H1N1)   The patient appears reasonably stabilized for admission considering the current resources, flow, and capabilities available in the ED at this time, and I doubt any other [facility] requiring further screening and/or treatment in the ED prior to admission.     ED Discharge Orders     None          Mathhew Buysse, MD 09/07/24 [id number]

## 2024-09-07 NOTE — ED Notes (Signed)
 Pt requesting to be placed back on Bipap, feeing sob even on 4L =Martin Santiago, still feeling congested, able to cough up some of the phlegm.

## 2024-09-07 NOTE — Progress Notes (Addendum)
 Assisted  with transporting PT from [facility] ED to 1422 while on BIPAP- uneventful. PT remains on BiPAP- has attempted to come off 2 times per RN in ED without success- becomes SOB.  Flutter therapy not completed PT remains on BiPAP.

## 2024-09-07 NOTE — ED Notes (Addendum)
 Pt removed on Bipap and placed on 4L [place], states he feels okay on [place] and in no distress.

## 2024-09-07 NOTE — Plan of Care (Signed)

## 2024-09-07 NOTE — Progress Notes (Signed)
 Patient attempting to remove his Bipap several times saying he was hoping to eat soon. No diet order at this time. Educated him on continuously wearing the Bipap for a bit longer. Sent message to Dr. Oswald nurse if patient would be able to take it off for a break and eat/drink? Spoke with respiratory team and recommended 3LNC if he is able to have some time off bipap to eat/drink, but needs to wait at least 20 min after eating/drinking before putting bipap back on for aspiration risk.

## 2024-09-07 NOTE — Progress Notes (Signed)
 PT on BiPAP at this time- unable to complete Flutter therapy in ED at this time.

## 2024-09-07 NOTE — H&P (Signed)
 History and Physical    Patient: Martin Santiago FMW:[id number] DOB: [birth year] DOA: 09/07/2024 DOS: the patient was seen and examined on 09/07/2024 PCP: de [place], Raymond J, MD  Patient coming from: Home  Chief Complaint:  Chief Complaint  Patient presents with   Shortness of Breath   HPI: Martin Santiago is a 73 y.o. male with medical history significant of AAA, status post EVAR, placement of Endo anchors, and, right CFA with primary repair of arteriotomy on 04/12/2024 with Dr. Sheree, close C1 fracture, history of colon polyps, Crohn's disease, tropical sprue, detached retina, secondary open-angle glaucoma left eye, coronary artery calcification, hypertension, history of tobacco use, acquired kyphosis, COPD who presented to the emergency department with complaints of dyspnea, productive cough, fatigue and malaise. He denied fever, chills, rhinorrhea, sore throat or hemoptysis.  No chest pain, palpitations, diaphoresis, PND, orthopnea or pitting edema of the lower extremities.  No abdominal pain, nausea, emesis, diarrhea, constipation, melena or hematochezia.  No flank pain, dysuria, frequency or hematuria.  No polyuria, polydipsia, polyphagia or blurred vision.   Lab work: Influenza A PCR was positive, negative coronavirus, influenza B and RSV PCR.  CBC showed a white count of 6.7, hemoglobin 11.8 g/dL and platelets 823.  Venous blood gas showed a pH of 7.38, pCO2 of 51 and pO2 88 mmHg, HCO3 30.2 and acid base excess 4.1 mmol/L.  BMP showed a glucose of 135 and calcium  of 10.4 mg/dL, the rest of the electrolytes and renal function were normal.  Imaging: 2 view chest radiograph showing background emphysema but no acute cardiopulmonary process.  ED course: Initial vital signs were temperature 97.8 F, pulse 131, respirations 28, BP 143/92 mmHg and O2 sat 90% on room air.  The patient received 500 mL of normal saline bolus, ceftriaxone /azithromycin , started on Tamiflu  and bronchodilators.   Review of  Systems: As mentioned in the history of present illness. All other systems reviewed and are negative. Past Medical History:  Diagnosis Date   AAA (abdominal aortic aneurysm)    9.8 cm 04/10/24 CTA   Closed C1 fracture (HCC) 05/28/2023   Colon polyps    COPD (chronic obstructive pulmonary disease) (HCC)    Crohn's disease (HCC)    Detached retina    Essential hypertension    Tropical sprue    Past Surgical History:  Procedure Laterality Date   ABDOMINAL AORTIC ENDOVASCULAR STENT GRAFT N/A 04/12/2024   Procedure: INSERTION, ENDOVASCULAR STENT GRAFT, AORTA, ABDOMINAL;  Surgeon: Sheree Penne Bruckner, MD;  Location: [facility] OR;  Service: Vascular;  Laterality: N/A;   BOWEL RESECTION  05/27/1994   [place]:  ? Crohn's   COLONOSCOPY     Dr Karena, [facility]:  Hx polyps. Cannot remmeber date   COLONOSCOPY N/A 12/03/2012   Procedure: COLONOSCOPY;  Surgeon: Lamar CHRISTELLA Hollingshead, MD;  Location: [facility];  Service: Endoscopy;  Laterality: N/A;  7:30   INGUINAL HERNIA REPAIR Right    REPAIR ILIAC ARTERY Right 04/12/2024   Procedure: REPAIR, ARTERY, FEMORAL;  Surgeon: Sheree Penne Bruckner, MD;  Location[facility] OR;  Service: Vascular;  Laterality: Right;   RETINAL DETACHMENT SURGERY  07/27/2006   RETINAL DETACHMENT SURGERY  09/26/2006   RETINAL DETACHMENT SURGERY  01/25/2007   TONSILLECTOMY     ULTRASOUND GUIDANCE FOR VASCULAR ACCESS Bilateral 04/12/2024   Procedure: ULTRASOUND GUIDANCE, FOR VASCULAR ACCESS;  Surgeon: Sheree Penne Bruckner, MD;  Location: [facility] OR;  Service: Vascular;  Laterality: Bilateral;   Social History:  reports that he has been smoking  cigarettes. He has a 110 pack-year smoking history. He has never used smokeless tobacco. He reports that he does not currently use alcohol. He reports that he does not use drugs.  Allergies[1]  Family History  Problem Relation Age of Onset   Colon cancer Maternal Aunt    Breast cancer Mother     Prior to Admission medications   Medication Sig Start Date End Date Taking? Authorizing Provider  albuterol  (PROVENTIL ) (2.5 MG/3ML) 0.083% nebulizer solution Take 3 mLs (2.5 mg total) by nebulization every 4 (four) hours. Patient taking differently: Take 2.5 mg by nebulization every 4 (four) hours as needed for wheezing or shortness of breath (COPD). 05/26/23   Kara Dorn NOVAK, MD  albuterol  (VENTOLIN  HFA) 108 (90 Base) MCG/ACT inhaler INHALE 2 PUFFS into THE lungs EVERY 6 HOURS AS NEEDED FOR wheezing OR SHORTNESS OF BREATH 06/24/24   Kara Dorn NOVAK, MD  amLODipine  (NORVASC ) 10 MG tablet Take 1 tablet (10 mg total) by mouth daily. Needs appt 06/24/24   Saguier, [place], PA-C  aspirin  EC 81 MG tablet Take 1 tablet (81 mg total) by mouth daily. Swallow whole. 05/12/23   Krasowski, Robert J, MD  atorvastatin  (LIPITOR) 20 MG tablet Take 1 tablet (20 mg total) by mouth daily. Patient needs an appointment for further refills. 2 nd attempt 08/28/24   Bernie Lamar PARAS, MD  azithromycin  (ZITHROMAX ) 250 MG tablet Take 1 tablet (250 mg total) by mouth daily. 08/09/24   Kara Dorn NOVAK, MD  BREZTRI  AEROSPHERE 160-9-4.8 MCG/ACT AERO INHALE 2 PUFFS INTO THE LUNGS IN THE MORNING AND AT BEDTIME 08/29/22   Kara Dorn NOVAK, MD  Ensifentrine  (OHTUVAYRE ) 3 MG/2.5ML SUSP Inhale 3 mg into the lungs in the morning and at bedtime. 04/04/24   Kara Dorn NOVAK, MD  losartan  (COZAAR ) 100 MG tablet TAKE 1 TABLET BY MOUTH ONCE DAILY (need office appointment) 06/24/24   Saguier, [place], PA-C  nicotine  (NICODERM CQ  - DOSED IN MG/24 HOURS) 21 mg/24hr patch Place 21 mg onto the skin daily.    [provider]  omeprazole  (PRILOSEC) 20 MG capsule Take 1 capsule (20 mg total) by mouth daily. 07/25/24   Saguier, [place], PA-C  OXYGEN  Inhale 2 L/min into the lungs continuous.    [provider]  sildenafil  (REVATIO ) 20 MG tablet 3-5 tab prior to sex.(Max use once in 24 hour period) 06/12/24   Saguier, [place], PA-C  tamsulosin  (FLOMAX ) 0.4 MG  CAPS capsule Take 0.4 mg by mouth in the morning.    [provider]  timolol  (TIMOPTIC ) 0.5 % ophthalmic solution Place 1 drop into the right eye daily. 06/10/21   [provider]    Physical Exam: Vitals:   09/07/24 0300 09/07/24 0400 09/07/24 0600 09/07/24 0630  BP: 130/88 122/85 113/77 106/62  Pulse: (!) 114 (!) 110  91  Resp: (!) 21 18 (!) 22 (!) 23  Temp:      TempSrc:      SpO2: 98% 96%  96%   Physical Exam Vitals reviewed.  Constitutional:      General: He is awake. He is not in acute distress.    Appearance: He is ill-appearing.  HENT:     Head: Normocephalic.     Nose: No rhinorrhea.     Mouth/Throat:     Mouth: Mucous membranes are moist.  Eyes:     General: No scleral icterus.    Pupils: Pupils are equal, round, and reactive to light.  Neck:     Vascular: No  JVD.  Cardiovascular:     Rate and Rhythm: Normal rate and regular rhythm.     Heart sounds: S1 normal and S2 normal.  Pulmonary:     Breath sounds: Wheezing and rhonchi present.  Abdominal:     General: Bowel sounds are normal. There is no distension.     Palpations: Abdomen is soft.     Tenderness: There is no abdominal tenderness. There is no right CVA tenderness or left CVA tenderness.  Musculoskeletal:     Cervical back: Neck supple.     Right lower leg: No edema.     Left lower leg: No edema.  Skin:    General: Skin is warm and dry.  Neurological:     General: No focal deficit present.     Mental Status: He is alert and oriented to person, place, and time.  Psychiatric:        Mood and Affect: Mood normal.        Behavior: Behavior normal. Behavior is cooperative.     Data Reviewed:  Results are pending, will review when available.  06/15/2023 echocardiogram report. IMPRESSIONS:   1. Left ventricular ejection fraction, by estimation, is 60 to 65%. The  left ventricle has normal function. The left ventricle has no regional  wall motion abnormalities. Left ventricular  diastolic parameters are  consistent with Grade I diastolic  dysfunction (impaired relaxation).   2. Right ventricular systolic function is normal. The right ventricular  size is normal.   3. The mitral valve is normal in structure. Mild mitral valve  regurgitation. No evidence of mitral stenosis.   4. The aortic valve was not well visualized. Aortic valve regurgitation  is trivial. Aortic valve sclerosis is present, with no evidence of aortic  valve stenosis.   5. The inferior vena cava is normal in size with greater than 50%  respiratory variability, suggesting right atrial pressure of 3 mmHg.   Comparison(s): Nuclear stress test done 03/07/16 showed an EF of 55%.   EKG: Vent. rate 129 BPM  PR interval 147 ms  QRS duration 84 ms  QT/QTcB 304/446 ms  P-R-T axes 86 258 63  Sinus tachycardia  Atrial premature complexes   Assessment and Plan: Principal Problem:   Acute respiratory failure with hypoxia (HCC) In the setting of:   COPD with acute exacerbation (HCC) Due to:   Influenza A Observation/telemetry Continue supplemental oxygen . Methylprednisolone  40 mg mg IVP x1. Followed by prednisone  40 mg p.o. daily in a.m. Scheduled and as needed bronchodilators. BiPAP ventilation as needed. Continue oseltamivir  75 mg p.o. twice daily. Follow-up CBC and chemistry in the morning.   Active Problems:   Tobacco abuse Tobacco cessation advised. Nicotine  replacement therapy ordered.    HTN (hypertension) Continue losartan  100 mg p.o. daily. Continue amlodipine  10 mg p.o. daily.    Coronary artery calcification Continue amlodipine , aspirin  and statin.    Dyslipidemia Continue atorvastatin  20 mg p.o. daily.    Normocytic anemia Monitor hematocrit hemoglobin.    Hypercalcemia Check calcium  and albumin  level in AM.    Hyperglycemia Check fasting glucose in AM. Further workup depending on results.    Glaucoma   Cataracts   History of retinal detachment Continue timolol   drops. Follow-up with ophthalmology as an outpatient.     Advance Care Planning:   Code Status: Full Code   Consults:   Family Communication:   Severity of Illness: The appropriate patient status for this patient is INPATIENT. Inpatient status is judged to be reasonable  and necessary in order to provide the required intensity of service to ensure the patient's safety. The patient's presenting symptoms, physical exam findings, and initial radiographic and laboratory data in the context of their chronic comorbidities is felt to place them at high risk for further clinical deterioration. Furthermore, it is not anticipated that the patient will be medically stable for discharge from the hospital within 2 midnights of admission.   * I certify that at the point of admission it is my clinical judgment that the patient will require inpatient hospital care spanning beyond 2 midnights from the point of admission due to high intensity of service, high risk for further deterioration and high frequency of surveillance required.*  Author: Alm Dorn Castor, MD 09/07/2024 8:16 AM  For on call review [email or web address].   This document was prepared using Dragon voice recognition software and may contain some unintended transcription errors.     [1]  Allergies Allergen Reactions   Codeine Nausea And Vomiting and Other (See Comments)    Other reaction(s): Flushing (ALLERGY/intolerance) fainting

## 2024-09-07 NOTE — ED Notes (Signed)
 Pt came off BIPAP to attempt to a sandwich and could not tolerate being off BIPAP and on [place] at 4L. Pt placed back on BIPAP.

## 2024-09-07 NOTE — Progress Notes (Signed)
 PT remains on BiPAP - Flutter therapy not completed in ED at this time.

## 2024-09-07 NOTE — ED Notes (Signed)
 Lab called for add ons per provider.

## 2024-09-07 NOTE — ED Notes (Signed)
 Patient transported to X-ray

## 2024-09-07 NOTE — Progress Notes (Signed)
°   09/07/24 2222  BiPAP/CPAP/SIPAP  BiPAP/CPAP/SIPAP Pt Type Adult  BiPAP/CPAP/SIPAP V60  Mask Type Full face mask  Dentures removed? Not applicable  Mask Size Medium  Set Rate 8 breaths/min  Respiratory Rate 27 breaths/min  IPAP 14 cmH20  EPAP 5 cmH2O  FiO2 (%) 28 %  Minute Ventilation 19.2  Leak 27  Peak Inspiratory Pressure (PIP) 16  Tidal Volume (Vt) 611  Patient Home Machine No  Patient Home Mask No  Patient Home Tubing No  Auto Titrate No  Press High Alarm 20 cmH2O (20)  Press Low Alarm 5 cmH2O  Device Plugged into RED Power Outlet Yes  BiPAP/CPAP /SiPAP Vitals  Resp (!) 26  MEWS Score/Color  MEWS Score 2  MEWS Score Color Yellow

## 2024-09-07 NOTE — ED Triage Notes (Signed)
 Pt came in via EMS from home w/ c/o of SOB. Used at home inhaler and breathing tx w/ no improvement. Normally on 2L of O2 at home. Chronic cough and hx of COPD. Denies chest pain.  2 g of mag 125 of solumedrol 15 mg albuteral 1 mg atrovent

## 2024-09-08 DIAGNOSIS — J441 Chronic obstructive pulmonary disease with (acute) exacerbation: Principal | ICD-10-CM | POA: Diagnosis present

## 2024-09-08 DIAGNOSIS — J9601 Acute respiratory failure with hypoxia: Secondary | ICD-10-CM | POA: Diagnosis present

## 2024-09-08 DIAGNOSIS — I10 Essential (primary) hypertension: Secondary | ICD-10-CM | POA: Diagnosis present

## 2024-09-08 LAB — CBC
HCT: 36.7 % — ABNORMAL LOW (ref 39.0–52.0)
Hemoglobin: 11.1 g/dL — ABNORMAL LOW (ref 13.0–17.0)
MCH: 24.7 pg — ABNORMAL LOW (ref 26.0–34.0)
MCHC: 30.2 g/dL (ref 30.0–36.0)
MCV: 81.7 fL (ref 80.0–100.0)
Platelets: 184 K/uL (ref 150–400)
RBC: 4.49 MIL/uL (ref 4.22–5.81)
RDW: 18.1 % — ABNORMAL HIGH (ref 11.5–15.5)
WBC: 6.5 K/uL (ref 4.0–10.5)
nRBC: 0 % (ref 0.0–0.2)

## 2024-09-08 LAB — COMPREHENSIVE METABOLIC PANEL WITH GFR
ALT: 6 U/L (ref 0–44)
AST: 20 U/L (ref 15–41)
Albumin: 3.8 g/dL (ref 3.5–5.0)
Alkaline Phosphatase: 79 U/L (ref 38–126)
Anion gap: 7 (ref 5–15)
BUN: 19 mg/dL (ref 8–23)
CO2: 27 mmol/L (ref 22–32)
Calcium: 9.8 mg/dL (ref 8.9–10.3)
Chloride: 105 mmol/L (ref 98–111)
Creatinine, Ser: 0.89 mg/dL (ref 0.61–1.24)
GFR, Estimated: >60 mL/min (ref >60)
Glucose, Bld: 99 mg/dL (ref 70–99)
Potassium: 4.8 mmol/L (ref 3.5–5.1)
Sodium: 139 mmol/L (ref 135–145)
Total Bilirubin: 0.4 mg/dL (ref 0.0–1.2)
Total Protein: 6.3 g/dL — ABNORMAL LOW (ref 6.5–8.1)

## 2024-09-08 LAB — PROCALCITONIN: Procalcitonin: <0.1 ng/mL

## 2024-09-08 LAB — HEMOGLOBIN A1C
Hgb A1c MFr Bld: 5.9 % — ABNORMAL HIGH (ref 4.8–5.6)
Mean Plasma Glucose: 122.63 mg/dL

## 2024-09-08 MED ORDER — ALBUTEROL SULFATE (2.5 MG/3ML) 0.083% IN NEBU: 2.5000 mg | INHALATION_SOLUTION | RESPIRATORY_TRACT | Status: DC | PRN

## 2024-09-08 MED ORDER — IPRATROPIUM-ALBUTEROL 0.5-2.5 (3) MG/3ML IN SOLN
3.0000 mL | RESPIRATORY_TRACT | Status: DC
  Administered 2024-09-08 – 2024-09-09 (×5): 3 mL via RESPIRATORY_TRACT
  Filled 2024-09-08 (×5): qty 3

## 2024-09-08 MED ORDER — METHYLPREDNISOLONE SODIUM SUCC 40 MG IJ SOLR
40.0000 mg | Freq: Two times a day (BID) | INTRAMUSCULAR | Status: DC
  Administered 2024-09-08 – 2024-09-10 (×5): 40 mg via INTRAVENOUS
  Filled 2024-09-08 (×5): qty 1

## 2024-09-08 MED ORDER — BUDESON-GLYCOPYRROL-FORMOTEROL 160-9-4.8 MCG/ACT IN AERO
2.0000 | INHALATION_SPRAY | Freq: Two times a day (BID) | RESPIRATORY_TRACT | Status: DC
  Administered 2024-09-08 – 2024-09-10 (×5): 2 via RESPIRATORY_TRACT
  Filled 2024-09-08: qty 5.9

## 2024-09-08 NOTE — Progress Notes (Signed)
°   09/08/24 1936  BiPAP/CPAP/SIPAP  BiPAP/CPAP/SIPAP Pt Type Adult (not needed at this   time)  BiPAP/CPAP /SiPAP Vitals  Bilateral Breath Sounds Diminished;Expiratory wheezes

## 2024-09-08 NOTE — Progress Notes (Signed)
 CPT being held due to pt being on Bipap

## 2024-09-08 NOTE — Progress Notes (Signed)
°   09/08/24 0312  BiPAP/CPAP/SIPAP  BiPAP/CPAP/SIPAP Pt Type Adult  BiPAP/CPAP/SIPAP V60  Mask Type Full face mask  Dentures removed? Not applicable  Mask Size Medium  Set Rate 8 breaths/min  Respiratory Rate 26 breaths/min  IPAP 14 cmH20  EPAP 5 cmH2O  FiO2 (%) 28 %  Minute Ventilation 16.9  Leak 25  Peak Inspiratory Pressure (PIP) 16  Tidal Volume (Vt) 755  Patient Home Machine No  Patient Home Mask No  Patient Home Tubing No  Auto Titrate No  Press High Alarm 20 cmH2O  Press Low Alarm 5 cmH2O  BiPAP/CPAP /SiPAP Vitals  Resp (!) 24  MEWS Score/Color  MEWS Score 1  MEWS Score Color Martin Santiago

## 2024-09-08 NOTE — Progress Notes (Addendum)
 PT has been removed from BiPAP and placed on 3 LPM nasal cannula (uses 2 lpm at home)- current Sp02 96% (will leave because PT is getting ready to eat). PT is slightly short of breath but states he feels like he can safely eat. PT was able to participate in Flutter therapy with a productive cough. CNA agrees to set up oral suction with [facility] for PT. RN is aware PT is off BiPAP at this time.

## 2024-09-08 NOTE — Plan of Care (Signed)

## 2024-09-08 NOTE — Progress Notes (Signed)
 [facility] PROGRESS NOTE    Progress Note  Martin Santiago  FMW:[id number] DOB: 25-May-1951 DOA: 09/07/2024 PCP: de [place], Raymond J, MD     Brief Narrative:   Martin Santiago is an 73 y.o. male past medical history of infrarenal AAA status post endovascular aneurysm repair with placement of Endo anchors and right CFA with primary repair of an arteriotomy on 04/12/2024 by Dr. Sheree, also history of C1 fracture, Crohn's disease retinal detachment secondary to open angle glaucoma of the left eye, essential hypertension history of tobacco abuse and COPD comes in to the ED with dyspnea, productive cough fever and malaise influenza PCR is positive   Assessment/Plan:   Acute respiratory failure with hypoxia secondary to Influenza A/COPD exacerbation Started on IV steroids and Tamiflu . At home he is oxygen  here in the hospital is requiring 5 L of oxygen  to keep saturation greater than 90%.  He relates he feels better while on the BiPAP.  I have notified RT to place back on BiPAP. Has remained afebrile with no leukocytosis. Continue to hold antibiotics, agree with Tamiflu . Out of bed to chair, continue sensi spirometer and flutter valve. Encourage incentive spirometry and flutter valve. Schedule his albuterol  in place and every 2 as needed on DuoNebs. Back on IV steroids he is has poor air movement.  Ongoing tobacco abuse Continue nicotine  patch.  Essential HTN (hypertension) Relatively controlled, continue losartan  and amlodipine .  Coronary artery calcification Continue amlodipine  aspirin  and statins.  Dyslipidemia Continue statins.  Normocytic anemia Hemoglobin is stable.  Hypercalcemia Likely due to dehydration now resolved.  Hyperglycemia Likely reactive.  He is on steroids which will make her blood glucose erratic. Check an A1c. Close eye on the blood glucose.  DVT prophylaxis: lovenox  Family Communication:none Status is: Inpatient Remains inpatient appropriate  because: Acute COPD exacerbation due to influenza A    Code Status:     Code Status Orders  (From admission, onward)           Start     Ordered   09/07/24 0838  Full code  Continuous       Question:  By:  Answer:  Consent: discussion documented in EHR   09/07/24 0841           Code Status History     Date Active Date Inactive Code Status Order ID Comments User Context   04/12/2024 1859 04/14/2024 1548 Full Code [id number]  Charlyne Teretha RIGGERS Inpatient   06/13/2017 1808 06/15/2017 2054 Full Code [id number]  Billy Junnie HERO, MD Inpatient         IV Access:   Peripheral IV   Procedures and diagnostic studies:   DG Chest 2 View Result Date: 09/07/2024 EXAM: 2 VIEW(S) XRAY OF THE CHEST 09/07/2024 02:05:00 AM COMPARISON: None available. CLINICAL HISTORY: sob FINDINGS: LUNGS AND PLEURA: Background emphysema. No focal pulmonary opacity. No pleural effusion. No pneumothorax. HEART AND MEDIASTINUM: Tortuous aorta with calcification. Atherosclerosis of the arch. BONES AND SOFT TISSUES: No acute osseous abnormality. IMPRESSION: 1. No acute cardiopulmonary process. 2. Background emphysema. Electronically signed by: Oneil Devonshire MD 09/07/2024 02:08 AM EST RP Workstation: [id number]     Medical Consultants:   None.   Subjective:    Martin Santiago relates his breathing is about the same but he feels better breathing on the BiPAP.  He requested to be back on the BiPAP  Objective:    Vitals:   09/08/24 0105 09/08/24 0312 09/08/24 0315 09/08/24 0607  BP:    ROLLEN)  140/99  Pulse:    88  Resp: (!) 25 (!) 24  (!) 24  Temp:    98.4 F (36.9 C)  TempSrc:    Oral  SpO2:   95% 95%  Weight:      Height:       SpO2: 95 % O2 Flow Rate (L/min): 2 L/min FiO2 (%): 28 %   Intake/Output Summary (Last 24 hours) at 09/08/2024 0931 Last data filed at 09/08/2024 9375 Gross per 24 hour  Intake 1356.63 ml  Output 1250 ml  Net 106.63 ml   Filed Weights   09/07/24 2123  Weight:  63.7 kg    Exam: General exam: In no acute distress. Respiratory system: Poor air movement minimal wheezing. Cardiovascular system: S1 & S2 heard, RRR. No JVD. Gastrointestinal system: Abdomen is nondistended, soft and nontender.  Extremities: No pedal edema. Skin: No rashes, lesions or ulcers Psychiatry: Judgement and insight appear normal. Mood & affect appropriate.    Data Reviewed:    Labs: Basic Metabolic Panel: Recent Labs  Lab 09/07/24 0147 09/07/24 1146 09/08/24 0356  NA 139  --  139  K 3.7  --  4.8  CL 102  --  105  CO2 26  --  27  GLUCOSE 135*  --  99  BUN 15  --  19  CREATININE 0.94  --  0.89  CALCIUM  10.4*  --  9.8  MG  --  2.7*  --   PHOS  --  3.3  --    GFR Estimated Creatinine Clearance: 64.6 mL/min (by C-G formula based on SCr of 0.89 mg/dL). Liver Function Tests: Recent Labs  Lab 09/08/24 0356  AST 20  ALT 6  ALKPHOS 79  BILITOT 0.4  PROT 6.3*  ALBUMIN  3.8   No results for input(s): LIPASE, AMYLASE in the last 168 hours. No results for input(s): AMMONIA in the last 168 hours. Coagulation profile No results for input(s): INR, PROTIME in the last 168 hours. COVID-19 Labs  No results for input(s): DDIMER, FERRITIN, LDH, CRP in the last 72 hours.  Lab Results  Component Value Date   SARSCOV2NAA NEGATIVE 09/07/2024    CBC: Recent Labs  Lab 09/07/24 0349 09/08/24 0356  WBC 6.7 6.5  HGB 11.8* 11.1*  HCT 37.9* 36.7*  MCV 80.8 81.7  PLT 176 184   Cardiac Enzymes: No results for input(s): CKTOTAL, CKMB, CKMBINDEX, TROPONINI in the last 168 hours. BNP (last 3 results) No results for input(s): PROBNP in the last 8760 hours. CBG: No results for input(s): GLUCAP in the last 168 hours. D-Dimer: No results for input(s): DDIMER in the last 72 hours. Hgb A1c: No results for input(s): HGBA1C in the last 72 hours. Lipid Profile: No results for input(s): CHOL, HDL, LDLCALC, TRIG, CHOLHDL,  LDLDIRECT in the last 72 hours. Thyroid function studies: No results for input(s): TSH, T4TOTAL, T3FREE, THYROIDAB in the last 72 hours.  Invalid input(s): FREET3 Anemia work up: No results for input(s): VITAMINB12, FOLATE, FERRITIN, TIBC, IRON , RETICCTPCT in the last 72 hours. Sepsis Labs: Recent Labs  Lab 09/07/24 0349 09/07/24 1146 09/08/24 0356  PROCALCITON  --  <0.10 <0.10  WBC 6.7  --  6.5   Microbiology Recent Results (from the past 240 hours)  Resp panel by RT-PCR (RSV, Flu A&B, Covid) Anterior Nasal Swab     Status: Abnormal   Collection Time: 09/07/24  1:58 AM   Specimen: Anterior Nasal Swab  Result Value Ref Range Status   SARS Coronavirus 2  by RT PCR NEGATIVE NEGATIVE Final    Comment: (NOTE) SARS-CoV-2 target nucleic acids are NOT DETECTED.  The SARS-CoV-2 RNA is generally detectable in upper respiratory specimens during the acute phase of infection. The lowest concentration of SARS-CoV-2 viral copies this assay can detect is 138 copies/mL. A negative result does not preclude SARS-Cov-2 infection and should not be used as the sole basis for treatment or other patient management decisions. A negative result may occur with  improper specimen collection/handling, submission of specimen other than nasopharyngeal swab, presence of viral mutation(s) within the areas targeted by this assay, and inadequate number of viral copies(<138 copies/mL). A negative result must be combined with clinical observations, patient history, and epidemiological information. The expected result is Negative.  Fact Sheet for Patients:  bloggercourse.com  Fact Sheet for Healthcare Providers:  seriousbroker.it  This test is no t yet approved or cleared by the [place]  FDA and  has been authorized for detection and/or diagnosis of SARS-CoV-2 by FDA under an Emergency Use Authorization (EUA). This EUA will remain   in effect (meaning this test can be used) for the duration of the COVID-19 declaration under Section 564(b)(1) of the Act, 21 U.S.C.section 360bbb-3(b)(1), unless the authorization is terminated  or revoked sooner.       Influenza A by PCR POSITIVE (A) NEGATIVE Final   Influenza B by PCR NEGATIVE NEGATIVE Final    Comment: (NOTE) The Xpert Xpress SARS-CoV-2/FLU/RSV plus assay is intended as an aid in the diagnosis of influenza from Nasopharyngeal swab specimens and should not be used as a sole basis for treatment. Nasal washings and aspirates are unacceptable for Xpert Xpress SARS-CoV-2/FLU/RSV testing.  Fact Sheet for Patients: bloggercourse.com  Fact Sheet for Healthcare Providers: seriousbroker.it  This test is not yet approved or cleared by the [place]  FDA and has been authorized for detection and/or diagnosis of SARS-CoV-2 by FDA under an Emergency Use Authorization (EUA). This EUA will remain in effect (meaning this test can be used) for the duration of the COVID-19 declaration under Section 564(b)(1) of the Act, 21 U.S.C. section 360bbb-3(b)(1), unless the authorization is terminated or revoked.     Resp Syncytial Virus by PCR NEGATIVE NEGATIVE Final    Comment: (NOTE) Fact Sheet for Patients: bloggercourse.com  Fact Sheet for Healthcare Providers: seriousbroker.it  This test is not yet approved or cleared by the [place]  FDA and has been authorized for detection and/or diagnosis of SARS-CoV-2 by FDA under an Emergency Use Authorization (EUA). This EUA will remain in effect (meaning this test can be used) for the duration of the COVID-19 declaration under Section 564(b)(1) of the Act, 21 U.S.C. section 360bbb-3(b)(1), unless the authorization is terminated or revoked.  Performed at [facility], [address] [place], [place]  [address]      Medications:    amLODipine   10 mg Oral Daily   aspirin  EC  81 mg Oral Daily   atorvastatin   20 mg Oral Daily   enoxaparin  (LOVENOX ) injection  40 mg Subcutaneous Q24H   ipratropium-albuterol   3 mL Nebulization Q4H   losartan   100 mg Oral Daily   nicotine   21 mg Transdermal Daily   oseltamivir   75 mg Oral BID   pantoprazole   40 mg Oral Daily   predniSONE   40 mg Oral Q breakfast   tamsulosin   0.4 mg Oral q AM   timolol   1 drop Right Eye Daily   Continuous Infusions:    LOS: 1 day   Erle  Odell Castor  [facility]  09/08/2024, 9:31 AM

## 2024-09-08 NOTE — Progress Notes (Signed)
°   09/08/24 0105  BiPAP/CPAP/SIPAP  $ Non-Invasive Ventilator  Non-Invasive Vent Subsequent  BiPAP/CPAP/SIPAP Pt Type Adult  BiPAP/CPAP/SIPAP V60  Mask Type Full face mask  Dentures removed? Not applicable  Mask Size Medium  Set Rate 8 breaths/min  Respiratory Rate 16 breaths/min  IPAP 14 cmH20  EPAP 5 cmH2O  FiO2 (%) 28 %  Minute Ventilation 18.3  Leak 13  Peak Inspiratory Pressure (PIP) 15  Tidal Volume (Vt) 580  Patient Home Machine No  Patient Home Mask No  Patient Home Tubing No  Auto Titrate No  Press High Alarm 20 cmH2O  Press Low Alarm 5 cmH2O  Device Plugged into RED Power Outlet Yes  BiPAP/CPAP /SiPAP Vitals  Resp (!) 25  MEWS Score/Color  MEWS Score 1  MEWS Score Color Green

## 2024-09-08 NOTE — Plan of Care (Signed)
  Problem: Education: Goal: Knowledge of General Education information will improve Description: Including pain rating scale, medication(s)/side effects and non-pharmacologic comfort measures Outcome: Progressing   Problem: Health Behavior/Discharge Planning: Goal: Ability to manage health-related needs will improve Outcome: Progressing   Problem: Clinical Measurements: Goal: Will remain free from infection Outcome: Progressing   Problem: Clinical Measurements: Goal: Ability to maintain clinical measurements within normal limits will improve Outcome: Progressing   Problem: Clinical Measurements: Goal: Respiratory complications will improve Outcome: Progressing

## 2024-09-08 NOTE — Progress Notes (Signed)
 CPT held due to pt being on Bipap

## 2024-09-09 ENCOUNTER — Other Ambulatory Visit (HOSPITAL_COMMUNITY): Payer: Self-pay

## 2024-09-09 MED ORDER — SODIUM CHLORIDE 0.9 % IV SOLN
100.0000 mg | Freq: Two times a day (BID) | INTRAVENOUS | Status: DC
  Administered 2024-09-09 – 2024-09-10 (×3): 100 mg via INTRAVENOUS
  Filled 2024-09-09 (×3): qty 100

## 2024-09-09 MED ORDER — IPRATROPIUM-ALBUTEROL 0.5-2.5 (3) MG/3ML IN SOLN
3.0000 mL | Freq: Four times a day (QID) | RESPIRATORY_TRACT | Status: DC
  Administered 2024-09-09 – 2024-09-10 (×5): 3 mL via RESPIRATORY_TRACT
  Filled 2024-09-09 (×5): qty 3

## 2024-09-09 NOTE — Progress Notes (Signed)
 [facility] PROGRESS NOTE    Progress Note  LEVAR FAYSON  FMW:[id number] DOB: July 05, 1951 DOA: 09/07/2024 PCP: de [place], Raymond J, MD     Brief Narrative:   EULA MAZZOLA is an 73 y.o. male past medical history of infrarenal AAA status post endovascular aneurysm repair with placement of Endo anchors and right CFA with primary repair of an arteriotomy on 04/12/2024 by Dr. Sheree, also history of C1 fracture, Crohn's disease retinal detachment secondary to open angle glaucoma of the left eye, essential hypertension history of tobacco abuse and COPD comes in to the ED with dyspnea, productive cough fever and malaise influenza PCR is positive.  Assessment/Plan:   Acute respiratory failure with hypoxia secondary to Influenza A/COPD exacerbation Continue IV steroids and Tamiflu . We were able to wean him to 2 L of oxygen . Tmax 99.3. Out of bed to chair, continue sensi spirometer and flutter valve. Encourage incentive spirometry and flutter valve.  Ongoing tobacco abuse Continue nicotine  patch.  Essential HTN (hypertension) Relatively controlled, continue losartan  and amlodipine .  Coronary artery calcification Continue amlodipine  aspirin  and statins.  Dyslipidemia Continue statins.  Normocytic anemia Hemoglobin is stable.  Hypercalcemia Likely due to dehydration now resolved.  Hyperglycemia Likely reactive.  He is on steroids which will make her blood glucose erratic. Check an A1c. Close eye on the blood glucose.  DVT prophylaxis: lovenox  Family Communication:none Status is: Inpatient Remains inpatient appropriate because: Acute COPD exacerbation due to influenza A    Code Status:     Code Status Orders  (From admission, onward)           Start     Ordered   09/07/24 0838  Full code  Continuous       Question:  By:  Answer:  Consent: discussion documented in EHR   09/07/24 0841           Code Status History     Date Active Date Inactive Code  Status Order ID Comments User Context   04/12/2024 1859 04/14/2024 1548 Full Code [id number]  Charlyne Teretha RIGGERS Inpatient   06/13/2017 1808 06/15/2017 2054 Full Code [id number]  Billy Junnie HERO, MD Inpatient         IV Access:   Peripheral IV   Procedures and diagnostic studies:   No results found.    Medical Consultants:   None.   Subjective:    [name] relates his breathing is better.  Feels better going to the bathroom. Objective:    Vitals:   09/09/24 0200 09/09/24 0437 09/09/24 0517 09/09/24 0742  BP:   128/85   Pulse:   70   Resp: (!) 30  18   Temp:      TempSrc:      SpO2:  94% 93% 94%  Weight:      Height:       SpO2: 94 % O2 Flow Rate (L/min): 2 L/min FiO2 (%): 32 %   Intake/Output Summary (Last 24 hours) at 09/09/2024 0750 Last data filed at 09/08/2024 2350 Gross per 24 hour  Intake 240 ml  Output 425 ml  Net -185 ml   Filed Weights   09/07/24 2123  Weight: 63.7 kg    Exam: General exam: In no acute distress. Respiratory system: Air movement has improved significantly now wheezing bilaterally. Cardiovascular system: S1 & S2 heard, RRR. No JVD. Gastrointestinal system: Abdomen is nondistended, soft and nontender.  Extremities: No pedal edema. Skin: No rashes, lesions or ulcers Psychiatry: Judgement and insight  appear normal. Mood & affect appropriate.  Data Reviewed:    Labs: Basic Metabolic Panel: Recent Labs  Lab 09/07/24 0147 09/07/24 1146 09/08/24 0356  NA 139  --  139  K 3.7  --  4.8  CL 102  --  105  CO2 26  --  27  GLUCOSE 135*  --  99  BUN 15  --  19  CREATININE 0.94  --  0.89  CALCIUM  10.4*  --  9.8  MG  --  2.7*  --   PHOS  --  3.3  --    GFR Estimated Creatinine Clearance: 64.6 mL/min (by C-G formula based on SCr of 0.89 mg/dL). Liver Function Tests: Recent Labs  Lab 09/08/24 0356  AST 20  ALT 6  ALKPHOS 79  BILITOT 0.4  PROT 6.3*  ALBUMIN  3.8   No results for input(s): LIPASE, AMYLASE  in the last 168 hours. No results for input(s): AMMONIA in the last 168 hours. Coagulation profile No results for input(s): INR, PROTIME in the last 168 hours. COVID-19 Labs  No results for input(s): DDIMER, FERRITIN, LDH, CRP in the last 72 hours.  Lab Results  Component Value Date   SARSCOV2NAA NEGATIVE 09/07/2024    CBC: Recent Labs  Lab 09/07/24 0349 09/08/24 0356  WBC 6.7 6.5  HGB 11.8* 11.1*  HCT 37.9* 36.7*  MCV 80.8 81.7  PLT 176 184   Cardiac Enzymes: No results for input(s): CKTOTAL, CKMB, CKMBINDEX, TROPONINI in the last 168 hours. BNP (last 3 results) No results for input(s): PROBNP in the last 8760 hours. CBG: No results for input(s): GLUCAP in the last 168 hours. D-Dimer: No results for input(s): DDIMER in the last 72 hours. Hgb A1c: Recent Labs    09/08/24 0957  HGBA1C 5.9*   Lipid Profile: No results for input(s): CHOL, HDL, LDLCALC, TRIG, CHOLHDL, LDLDIRECT in the last 72 hours. Thyroid function studies: No results for input(s): TSH, T4TOTAL, T3FREE, THYROIDAB in the last 72 hours.  Invalid input(s): FREET3 Anemia work up: No results for input(s): VITAMINB12, FOLATE, FERRITIN, TIBC, IRON , RETICCTPCT in the last 72 hours. Sepsis Labs: Recent Labs  Lab 09/07/24 0349 09/07/24 1146 09/08/24 0356  PROCALCITON  --  <0.10 <0.10  WBC 6.7  --  6.5   Microbiology Recent Results (from the past 240 hours)  Resp panel by RT-PCR (RSV, Flu A&B, Covid) Anterior Nasal Swab     Status: Abnormal   Collection Time: 09/07/24  1:58 AM   Specimen: Anterior Nasal Swab  Result Value Ref Range Status   SARS Coronavirus 2 by RT PCR NEGATIVE NEGATIVE Final    Comment: (NOTE) SARS-CoV-2 target nucleic acids are NOT DETECTED.  The SARS-CoV-2 RNA is generally detectable in upper respiratory specimens during the acute phase of infection. The lowest concentration of SARS-CoV-2 viral copies this assay can  detect is 138 copies/mL. A negative result does not preclude SARS-Cov-2 infection and should not be used as the sole basis for treatment or other patient management decisions. A negative result may occur with  improper specimen collection/handling, submission of specimen other than nasopharyngeal swab, presence of viral mutation(s) within the areas targeted by this assay, and inadequate number of viral copies(<138 copies/mL). A negative result must be combined with clinical observations, patient history, and epidemiological information. The expected result is Negative.  Fact Sheet for Patients:  bloggercourse.com  Fact Sheet for Healthcare Providers:  seriousbroker.it  This test is no t yet approved or cleared by the [place]  FDA  and  has been authorized for detection and/or diagnosis of SARS-CoV-2 by FDA under an Emergency Use Authorization (EUA). This EUA will remain  in effect (meaning this test can be used) for the duration of the COVID-19 declaration under Section 564(b)(1) of the Act, 21 U.S.C.section 360bbb-3(b)(1), unless the authorization is terminated  or revoked sooner.       Influenza A by PCR POSITIVE (A) NEGATIVE Final   Influenza B by PCR NEGATIVE NEGATIVE Final    Comment: (NOTE) The Xpert Xpress SARS-CoV-2/FLU/RSV plus assay is intended as an aid in the diagnosis of influenza from Nasopharyngeal swab specimens and should not be used as a sole basis for treatment. Nasal washings and aspirates are unacceptable for Xpert Xpress SARS-CoV-2/FLU/RSV testing.  Fact Sheet for Patients: bloggercourse.com  Fact Sheet for Healthcare Providers: seriousbroker.it  This test is not yet approved or cleared by the [place]  FDA and has been authorized for detection and/or diagnosis of SARS-CoV-2 by FDA under an Emergency Use Authorization (EUA). This EUA will  remain in effect (meaning this test can be used) for the duration of the COVID-19 declaration under Section 564(b)(1) of the Act, 21 U.S.C. section 360bbb-3(b)(1), unless the authorization is terminated or revoked.     Resp Syncytial Virus by PCR NEGATIVE NEGATIVE Final    Comment: (NOTE) Fact Sheet for Patients: bloggercourse.com  Fact Sheet for Healthcare Providers: seriousbroker.it  This test is not yet approved or cleared by the [place]  FDA and has been authorized for detection and/or diagnosis of SARS-CoV-2 by FDA under an Emergency Use Authorization (EUA). This EUA will remain in effect (meaning this test can be used) for the duration of the COVID-19 declaration under Section 564(b)(1) of the Act, 21 U.S.C. section 360bbb-3(b)(1), unless the authorization is terminated or revoked.  Performed at [facility], [address], [place], [place] [address]      Medications:    amLODipine   10 mg Oral Daily   aspirin  EC  81 mg Oral Daily   atorvastatin   20 mg Oral Daily   budesonide -glycopyrrolate -formoterol   2 puff Inhalation BID   enoxaparin  (LOVENOX ) injection  40 mg Subcutaneous Q24H   ipratropium-albuterol   3 mL Nebulization QID   losartan   100 mg Oral Daily   methylPREDNISolone  (SOLU-MEDROL ) injection  40 mg Intravenous Q12H   nicotine   21 mg Transdermal Daily   oseltamivir   75 mg Oral BID   tamsulosin   0.4 mg Oral q AM   timolol   1 drop Right Eye Daily   Continuous Infusions:  doxycycline  (VIBRAMYCIN ) IV 100 mg (09/09/24 0556)      LOS: 2 days   Erle Odell Castor  [facility]  09/09/2024, 7:50 AM

## 2024-09-09 NOTE — Evaluation (Addendum)
 Physical Therapy Evaluation Patient Details Name: Martin Santiago MRN: [id number] DOB: 06-19-1951 Today's Date: 09/09/2024  History of Present Illness  73 y.o. male comes in to the ED with dyspnea, productive cough fever and malaise influenza PCR is positive. past medical history of infrarenal AAA status post endovascular aneurysm repair with placement of Endo anchors and right CFA with primary repair of an arteriotomy on 04/12/2024 by Dr. Sheree, also history of C1 fracture, Crohn's disease retinal detachment secondary to open angle glaucoma of the left eye, essential hypertension history of tobacco abuse and COPD.  Clinical Impression  Pt admitted with above diagnosis. Pt performed supine to sit with HOB up and use of bed rail. He sat edge of bed for ~18 minutes, and performed lateral scoot x 3 at edge of bed. He did not feel he could tolerate sit to stand nor ambulation 2* dyspnea at rest. RN notified of pt request for inhaler. SpO2 91% on 2.5L O2 at rest, 93% on 4L O2 at rest, dropped to 89% on 2.5L with talking. Pt uses 2L O2 at baseline, walks in ILF apartment without AD, uses motorized scooter for longer distances.  Pt currently with functional limitations due to the deficits listed below (see PT Problem List). Pt will benefit from acute skilled PT to increase their independence and safety with mobility to allow discharge.           If plan is discharge home, recommend the following: Assistance with cooking/housework;Assist for transportation;Help with stairs or ramp for entrance;A little help with bathing/dressing/bathroom   Can travel by private vehicle        Equipment Recommendations None recommended by PT  Recommendations for Other Services       Functional Status Assessment Patient has had a recent decline in their functional status and demonstrates the ability to make significant improvements in function in a reasonable and predictable amount of time.     Precautions /  Restrictions Precautions Precautions: Fall Recall of Precautions/Restrictions: Intact Precaution/Restrictions Comments: on 2L home O2 Restrictions Weight Bearing Restrictions Per Provider Order: No      Mobility  Bed Mobility Overal bed mobility: Modified Independent             General bed mobility comments: supine to sit with HOB up, used rail    Transfers Overall transfer level: Needs assistance Equipment used: None Transfers: Sit to/from Stand Sit to Stand: Supervision           General transfer comment: lateral scoot towards HOB x 3 in squat position    Ambulation/Gait               General Gait Details: deferred 2* pt with SOB sitting at EOB, SpO2 91% on 2.5L at rest, 93% on 4L O2 at rest, pt requested inhaler, RN notified  Stairs            Wheelchair Mobility     Tilt Bed    Modified Rankin (Stroke Patients Only)       Balance Overall balance assessment: Needs assistance Sitting-balance support: Feet supported, No upper extremity supported Sitting balance-Leahy Scale: Good                                       Pertinent Vitals/Pain Pain Assessment Pain Assessment: No/denies pain    Home Living Family/patient expects to be discharged to:: Private residence Living Arrangements: Alone Available Help at  Discharge: Friend(s);Available PRN/intermittently Type of Home: Apartment         Home Layout: One level Home Equipment: Agricultural Consultant (2 wheels);Wheelchair - power;Grab bars - tub/shower;BSC/3in1 Additional Comments: lives at [employer] ILF;on 2LO2  baseline; friend stays  with him 2 nights per week    Prior Function Prior Level of Function : Independent/Modified Independent             Mobility Comments: no falls in 6 months, uses scooter to get dining room 2* SOB, walks without AD in apartment ADLs Comments: doesn't drive 2* poor vision, groceries are delivered; can use electric scooter to get to  [employer] and [employer]; ILF has 3 meals/day; independent ADLs     Extremity/Trunk Assessment   Upper Extremity Assessment Upper Extremity Assessment: Overall WFL for tasks assessed    Lower Extremity Assessment Lower Extremity Assessment: Overall WFL for tasks assessed    Cervical / Trunk Assessment Cervical / Trunk Assessment: Kyphotic  Communication   Communication Communication: No apparent difficulties    Cognition Arousal: Alert Behavior During Therapy: WFL for tasks assessed/performed   PT - Cognitive impairments: No apparent impairments                         Following commands: Intact       Cueing       General Comments      Exercises     Assessment/Plan    PT Assessment Patient needs continued PT services  PT Problem List Cardiopulmonary status limiting activity;Decreased activity tolerance;Decreased mobility       PT Treatment Interventions Gait training;Therapeutic exercise;Functional mobility training;Patient/family education;Therapeutic activities    PT Goals (Current goals can be found in the Care Plan section)  Acute Rehab PT Goals Patient Stated Goal: 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, previously was a church organist PT Goal Formulation: With patient Time For Goal Achievement: 09/23/24 Potential to Achieve Goals: Good    Frequency Min 3X/week     Co-evaluation               AM-PAC PT 6 Clicks Mobility  Outcome Measure Help needed turning from your back to your side while in a flat bed without using bedrails?: None Help needed moving from lying on your back to sitting on the side of a flat bed without using bedrails?: None Help needed moving to and from a bed to a chair (including a wheelchair)?: A Little Help needed standing up from a chair using your arms (e.g., wheelchair or bedside chair)?: A Little Help needed to walk in hospital room?: A Lot Help needed climbing 3-5 steps with a railing? : A Lot 6 Click Score: 18     End of Session   Activity Tolerance: Patient limited by fatigue Patient left: in bed;with call bell/phone within reach;with bed alarm set Nurse Communication: Mobility status;Other (comment) (requested inhaler) PT Visit Diagnosis: Difficulty in walking, not elsewhere classified (R26.2);Other abnormalities of gait and mobility (R26.89)    Time: 9050-8983 PT Time Calculation (min) (ACUTE ONLY): 27 min   Charges:   PT Evaluation $PT Eval Moderate Complexity: 1 Mod PT Treatments $Therapeutic Activity: 8-22 mins PT General Charges $$ ACUTE PT VISIT: 1 Visit        Sylvan Delon Copp PT 09/09/2024  Acute Rehabilitation Services  Office [phone]

## 2024-09-10 ENCOUNTER — Other Ambulatory Visit (HOSPITAL_COMMUNITY): Payer: Self-pay

## 2024-09-10 MED ORDER — ALBUTEROL SULFATE (2.5 MG/3ML) 0.083% IN NEBU: 2.5000 mg | INHALATION_SOLUTION | Freq: Three times a day (TID) | RESPIRATORY_TRACT | Status: DC

## 2024-09-10 MED ORDER — OSELTAMIVIR PHOSPHATE 75 MG PO CAPS
75.0000 mg | ORAL_CAPSULE | Freq: Two times a day (BID) | ORAL | 0 refills | Status: AC
  Filled 2024-09-10: qty 4, 2d supply, fill #0

## 2024-09-10 MED ORDER — PREDNISONE 10 MG PO TABS
ORAL_TABLET | ORAL | 0 refills | Status: DC
  Filled 2024-09-10: qty 21, 6d supply, fill #0

## 2024-09-10 NOTE — Plan of Care (Signed)
°  Problem: Clinical Measurements: Goal: Respiratory complications will improve Outcome: Not Progressing   Problem: Safety: Goal: Ability to remain free from injury will improve Outcome: Not Progressing   Problem: Respiratory: Goal: Ability to maintain a clear airway will improve Outcome: Not Progressing Goal: Levels of oxygenation will improve Outcome: Not Progressing Goal: Ability to maintain adequate ventilation will improve Outcome: Not Progressing   Problem: Clinical Measurements: Goal: Will remain free from infection Outcome: Not Progressing

## 2024-09-10 NOTE — Discharge Summary (Addendum)
 Physician Discharge Summary  Martin Santiago FMW:[id number] DOB: April 09, 1951 DOA: 09/07/2024  PCP: de [place], Raymond J, MD  Admit date: 09/07/2024 Discharge date: 09/10/2024  Admitted From: Home Disposition:  Home  Recommendations for Outpatient Follow-up:  Follow up with PCP in 1-2 weeks Please obtain BMP/CBC in one week   Home Health:No Equipment/Devices:None  Discharge Condition:Stable CODE STATUS:Full Diet recommendation: Heart Healthy  Brief/Interim Summary: 73 y.o. male past medical history of infrarenal AAA status post endovascular aneurysm repair with placement of Endo anchors and right CFA with primary repair of an arteriotomy on 04/12/2024 by Dr. Sheree, also history of C1 fracture, Crohn's disease retinal detachment secondary to open angle glaucoma of the left eye, essential hypertension history of tobacco abuse and COPD comes in to the ED with dyspnea, productive cough fever and malaise influenza PCR is positive.   Discharge Diagnoses:  Principal Problem:   Influenza A Active Problems:   Acute respiratory failure with hypoxia (HCC)   Tobacco abuse   HTN (hypertension)   Coronary artery calcification   Dyslipidemia   Normocytic anemia   Hypercalcemia   Hyperglycemia   COPD with acute exacerbation (HCC)   Glaucoma  Acute respiratory failure with hypoxia secondary to influenza/acute COPD exacerbation: He was started on steroids IV fluids and Tamiflu . He was scheduled on albuterol  required initially 5 L of oxygen  we were able to wean him down to his home dose of 2 L. He will continue steroids and Tamiflu  as an outpatient.  Ongoing tobacco abuse: Noted, he was prescribed nicotine  patch.  Essential hypertension: No changes made to his medication.  Coronary artery disease: No change made to his medication.  Dyslipidemia: Continue statins.  Normocytic anemia: Hemoglobin stable.  Hypercalcemia: Likely due to dehydration resolved with IV  fluids.  Hyperglycemia: Likely reactive.  Hemoglobin A1c is 5.9.  Discharge Instructions  Discharge Instructions     Increase activity slowly   Complete by: As directed       Allergies as of 09/10/2024       Reactions   Codeine Nausea And Vomiting, Other (See Comments)   Other reaction(s): Flushing (ALLERGY/intolerance) fainting        Medication List     TAKE these medications    albuterol  (2.5 MG/3ML) 0.083% nebulizer solution Commonly known as: PROVENTIL  Take 3 mLs (2.5 mg total) by nebulization every 4 (four) hours. What changed:  when to take this reasons to take this   albuterol  108 (90 Base) MCG/ACT inhaler Commonly known as: VENTOLIN  HFA INHALE 2 PUFFS into THE lungs EVERY 6 HOURS AS NEEDED FOR wheezing OR SHORTNESS OF BREATH What changed: Another medication with the same name was changed. Make sure you understand how and when to take each.   amLODipine  10 MG tablet Commonly known as: NORVASC  Take 1 tablet (10 mg total) by mouth daily. Needs appt   aspirin  EC 81 MG tablet Take 1 tablet (81 mg total) by mouth daily. Swallow whole.   atorvastatin  20 MG tablet Commonly known as: LIPITOR Take 1 tablet (20 mg total) by mouth daily. Patient needs an appointment for further refills. 2 nd attempt   azithromycin  250 MG tablet Commonly known as: ZITHROMAX  Take 1 tablet (250 mg total) by mouth daily.   Breztri  Aerosphere 160-9-4.8 MCG/ACT Aero inhaler Generic drug: budesonide -glycopyrrolate -formoterol  INHALE 2 PUFFS INTO THE LUNGS IN THE MORNING AND AT BEDTIME   losartan  100 MG tablet Commonly known as: COZAAR  TAKE 1 TABLET BY MOUTH ONCE DAILY (need office appointment)   nicotine   21 mg/24hr patch Commonly known as: NICODERM CQ  - dosed in mg/24 hours Place 21 mg onto the skin daily.   Ohtuvayre  3 MG/2.5ML Susp Generic drug: Ensifentrine  Inhale 3 mg into the lungs in the morning and at bedtime.   omeprazole  20 MG capsule Commonly known as:  PRILOSEC Take 1 capsule (20 mg total) by mouth daily.   oseltamivir  75 MG capsule Commonly known as: TAMIFLU  Take 1 capsule (75 mg total) by mouth 2 (two) times daily for 2 days.   OXYGEN  Inhale 2 L/min into the lungs continuous.   predniSONE  10 MG tablet Commonly known as: DELTASONE  Takes 6 tablets for 1 days, then 5 tablets for 1 days, then 4 tablets for 1 days, then 3 tablets for 1 days, then 2 tabs for 1 days, then 1 tab for 1 days, and then stop.   sildenafil  20 MG tablet Commonly known as: REVATIO  3-5 tab prior to sex.(Max use once in 24 hour period)   tamsulosin  0.4 MG Caps capsule Commonly known as: FLOMAX  Take 0.4 mg by mouth in the morning.   timolol  0.5 % ophthalmic solution Commonly known as: TIMOPTIC  Place 1 drop into the right eye daily.        Allergies[1]  Consultations: None   Procedures/Studies: DG Chest 2 View Result Date: 09/07/2024 EXAM: 2 VIEW(S) XRAY OF THE CHEST 09/07/2024 02:05:00 AM COMPARISON: None available. CLINICAL HISTORY: sob FINDINGS: LUNGS AND PLEURA: Background emphysema. No focal pulmonary opacity. No pleural effusion. No pneumothorax. HEART AND MEDIASTINUM: Tortuous aorta with calcification. Atherosclerosis of the arch. BONES AND SOFT TISSUES: No acute osseous abnormality. IMPRESSION: 1. No acute cardiopulmonary process. 2. Background emphysema. Electronically signed by: Oneil Devonshire MD 09/07/2024 02:08 AM EST RP Workstation: [id number]   (Echo, Carotid, EGD, Colonoscopy, ERCP)    Subjective: No complaints.  Discharge Exam: Vitals:   09/10/24 0443 09/10/24 0832  BP: 116/65   Pulse: 74   Resp:    Temp: 97.8 F (36.6 C)   SpO2: 97% 95%   Vitals:   09/09/24 2055 09/09/24 2125 09/10/24 0443 09/10/24 0832  BP: 136/88  116/65   Pulse: 71  74   Resp:      Temp:  97.9 F (36.6 C) 97.8 F (36.6 C)   TempSrc:   Oral   SpO2: 99% 95% 97% 95%  Weight:      Height:        General: Pt is alert, awake, not in acute  distress Cardiovascular: RRR, S1/S2 +, no rubs, no gallops Respiratory: CTA bilaterally, no wheezing, no rhonchi Abdominal: Soft, NT, ND, bowel sounds + Extremities: no edema, no cyanosis    The results of significant diagnostics from this hospitalization (including imaging, microbiology, ancillary and laboratory) are listed below for reference.     Microbiology: Recent Results (from the past 240 hours)  Resp panel by RT-PCR (RSV, Flu A&B, Covid) Anterior Nasal Swab     Status: Abnormal   Collection Time: 09/07/24  1:58 AM   Specimen: Anterior Nasal Swab  Result Value Ref Range Status   SARS Coronavirus 2 by RT PCR NEGATIVE NEGATIVE Final    Comment: (NOTE) SARS-CoV-2 target nucleic acids are NOT DETECTED.  The SARS-CoV-2 RNA is generally detectable in upper respiratory specimens during the acute phase of infection. The lowest concentration of SARS-CoV-2 viral copies this assay can detect is 138 copies/mL. A negative result does not preclude SARS-Cov-2 infection and should not be used as the sole basis for treatment or other patient management decisions.  A negative result may occur with  improper specimen collection/handling, submission of specimen other than nasopharyngeal swab, presence of viral mutation(s) within the areas targeted by this assay, and inadequate number of viral copies(<138 copies/mL). A negative result must be combined with clinical observations, patient history, and epidemiological information. The expected result is Negative.  Fact Sheet for Patients:  bloggercourse.com  Fact Sheet for Healthcare Providers:  seriousbroker.it  This test is no t yet approved or cleared by the [place]  FDA and  has been authorized for detection and/or diagnosis of SARS-CoV-2 by FDA under an Emergency Use Authorization (EUA). This EUA will remain  in effect (meaning this test can be used) for the duration of  the COVID-19 declaration under Section 564(b)(1) of the Act, 21 U.S.C.section 360bbb-3(b)(1), unless the authorization is terminated  or revoked sooner.       Influenza A by PCR POSITIVE (A) NEGATIVE Final   Influenza B by PCR NEGATIVE NEGATIVE Final    Comment: (NOTE) The Xpert Xpress SARS-CoV-2/FLU/RSV plus assay is intended as an aid in the diagnosis of influenza from Nasopharyngeal swab specimens and should not be used as a sole basis for treatment. Nasal washings and aspirates are unacceptable for Xpert Xpress SARS-CoV-2/FLU/RSV testing.  Fact Sheet for Patients: bloggercourse.com  Fact Sheet for Healthcare Providers: seriousbroker.it  This test is not yet approved or cleared by the [place]  FDA and has been authorized for detection and/or diagnosis of SARS-CoV-2 by FDA under an Emergency Use Authorization (EUA). This EUA will remain in effect (meaning this test can be used) for the duration of the COVID-19 declaration under Section 564(b)(1) of the Act, 21 U.S.C. section 360bbb-3(b)(1), unless the authorization is terminated or revoked.     Resp Syncytial Virus by PCR NEGATIVE NEGATIVE Final    Comment: (NOTE) Fact Sheet for Patients: bloggercourse.com  Fact Sheet for Healthcare Providers: seriousbroker.it  This test is not yet approved or cleared by the [place]  FDA and has been authorized for detection and/or diagnosis of SARS-CoV-2 by FDA under an Emergency Use Authorization (EUA). This EUA will remain in effect (meaning this test can be used) for the duration of the COVID-19 declaration under Section 564(b)(1) of the Act, 21 U.S.C. section 360bbb-3(b)(1), unless the authorization is terminated or revoked.  Performed at [facility], [address] [place], [place] [address]      Labs: BNP (last 3 results) No results for  input(s): BNP in the last 8760 hours. Basic Metabolic Panel: Recent Labs  Lab 09/07/24 0147 09/07/24 1146 09/08/24 0356  NA 139  --  139  K 3.7  --  4.8  CL 102  --  105  CO2 26  --  27  GLUCOSE 135*  --  99  BUN 15  --  19  CREATININE 0.94  --  0.89  CALCIUM  10.4*  --  9.8  MG  --  2.7*  --   PHOS  --  3.3  --    Liver Function Tests: Recent Labs  Lab 09/08/24 0356  AST 20  ALT 6  ALKPHOS 79  BILITOT 0.4  PROT 6.3*  ALBUMIN  3.8   No results for input(s): LIPASE, AMYLASE in the last 168 hours. No results for input(s): AMMONIA in the last 168 hours. CBC: Recent Labs  Lab 09/07/24 0349 09/08/24 0356  WBC 6.7 6.5  HGB 11.8* 11.1*  HCT 37.9* 36.7*  MCV 80.8 81.7  PLT 176 184   Cardiac Enzymes: No results for  input(s): CKTOTAL, CKMB, CKMBINDEX, TROPONINI in the last 168 hours. BNP: Invalid input(s): POCBNP CBG: No results for input(s): GLUCAP in the last 168 hours. D-Dimer No results for input(s): DDIMER in the last 72 hours. Hgb A1c Recent Labs    09/08/24 0957  HGBA1C 5.9*   Lipid Profile No results for input(s): CHOL, HDL, LDLCALC, TRIG, CHOLHDL, LDLDIRECT in the last 72 hours. Thyroid function studies No results for input(s): TSH, T4TOTAL, T3FREE, THYROIDAB in the last 72 hours.  Invalid input(s): FREET3 Anemia work up No results for input(s): VITAMINB12, FOLATE, FERRITIN, TIBC, IRON , RETICCTPCT in the last 72 hours. Urinalysis    Component Value Date/Time   COLORURINE YELLOW 04/12/2024 1051   APPEARANCEUR CLEAR 04/12/2024 1051   LABSPEC 1.018 04/12/2024 1051   PHURINE 7.0 04/12/2024 1051   GLUCOSEU NEGATIVE 04/12/2024 1051   HGBUR NEGATIVE 04/12/2024 1051   BILIRUBINUR NEGATIVE 04/12/2024 1051   KETONESUR NEGATIVE 04/12/2024 1051   PROTEINUR NEGATIVE 04/12/2024 1051   NITRITE NEGATIVE 04/12/2024 1051   LEUKOCYTESUR NEGATIVE 04/12/2024 1051   Sepsis Labs Recent Labs  Lab  09/07/24 0349 09/08/24 0356  WBC 6.7 6.5   Microbiology Recent Results (from the past 240 hours)  Resp panel by RT-PCR (RSV, Flu A&B, Covid) Anterior Nasal Swab     Status: Abnormal   Collection Time: 09/07/24  1:58 AM   Specimen: Anterior Nasal Swab  Result Value Ref Range Status   SARS Coronavirus 2 by RT PCR NEGATIVE NEGATIVE Final    Comment: (NOTE) SARS-CoV-2 target nucleic acids are NOT DETECTED.  The SARS-CoV-2 RNA is generally detectable in upper respiratory specimens during the acute phase of infection. The lowest concentration of SARS-CoV-2 viral copies this assay can detect is 138 copies/mL. A negative result does not preclude SARS-Cov-2 infection and should not be used as the sole basis for treatment or other patient management decisions. A negative result may occur with  improper specimen collection/handling, submission of specimen other than nasopharyngeal swab, presence of viral mutation(s) within the areas targeted by this assay, and inadequate number of viral copies(<138 copies/mL). A negative result must be combined with clinical observations, patient history, and epidemiological information. The expected result is Negative.  Fact Sheet for Patients:  bloggercourse.com  Fact Sheet for Healthcare Providers:  seriousbroker.it  This test is no t yet approved or cleared by the [place]  FDA and  has been authorized for detection and/or diagnosis of SARS-CoV-2 by FDA under an Emergency Use Authorization (EUA). This EUA will remain  in effect (meaning this test can be used) for the duration of the COVID-19 declaration under Section 564(b)(1) of the Act, 21 U.S.C.section 360bbb-3(b)(1), unless the authorization is terminated  or revoked sooner.       Influenza A by PCR POSITIVE (A) NEGATIVE Final   Influenza B by PCR NEGATIVE NEGATIVE Final    Comment: (NOTE) The Xpert Xpress SARS-CoV-2/FLU/RSV plus assay  is intended as an aid in the diagnosis of influenza from Nasopharyngeal swab specimens and should not be used as a sole basis for treatment. Nasal washings and aspirates are unacceptable for Xpert Xpress SARS-CoV-2/FLU/RSV testing.  Fact Sheet for Patients: bloggercourse.com  Fact Sheet for Healthcare Providers: seriousbroker.it  This test is not yet approved or cleared by the [place]  FDA and has been authorized for detection and/or diagnosis of SARS-CoV-2 by FDA under an Emergency Use Authorization (EUA). This EUA will remain in effect (meaning this test can be used) for the duration of the COVID-19 declaration under Section 564(b)(1)  of the Act, 21 U.S.C. section 360bbb-3(b)(1), unless the authorization is terminated or revoked.     Resp Syncytial Virus by PCR NEGATIVE NEGATIVE Final    Comment: (NOTE) Fact Sheet for Patients: bloggercourse.com  Fact Sheet for Healthcare Providers: seriousbroker.it  This test is not yet approved or cleared by the [place]  FDA and has been authorized for detection and/or diagnosis of SARS-CoV-2 by FDA under an Emergency Use Authorization (EUA). This EUA will remain in effect (meaning this test can be used) for the duration of the COVID-19 declaration under Section 564(b)(1) of the Act, 21 U.S.C. section 360bbb-3(b)(1), unless the authorization is terminated or revoked.  Performed at [facility], [address], [place], [place] [address]      Time coordinating discharge: Over 30 minutes  SIGNED:   Erle Odell Castor, MD  [facility] 09/10/2024, 10:21 AM Pager   If 7PM-7AM, please contact night-coverage [email or web address] Password [email or web address]     [1]  Allergies Allergen Reactions   Codeine Nausea And Vomiting and Other (See Comments)    Other reaction(s): Flushing (ALLERGY/intolerance) fainting

## 2024-09-10 NOTE — Progress Notes (Signed)
 Mobility Specialist Progress Note:  [place] 2.5 L   09/10/24 1107  Mobility  Activity  (Bed Exercises)  Level of Assistance Independent  Range of Motion/Exercises Active  Activity Response Tolerated well  Mobility Referral Yes  Mobility visit 1 Mobility  Mobility Specialist Start Time (ACUTE ONLY) 1035  Mobility Specialist Stop Time (ACUTE ONLY) 1046  Mobility Specialist Time Calculation (min) (ACUTE ONLY) 11 min   Pt was received in bed and agreed to mobility: Bed Exercises Seated BLE Exercises:  1) Toe Raise/ Heel Raise: 1 x 8 each leg  2) Ankle Pumps: 1 x 10 each foot  3) Marching: 1 x 10 each leg  4) Hip Adduction (pillow squeezes): 1 x 10  Pt had no complaints or issues during session, returned to bed with all needs met. Call bell in reach. Left in room with RN and family.   [employer] - Mobility Specialist

## 2024-09-10 NOTE — TOC Initial Note (Addendum)
 Transition of Care [facility]) - Initial/Assessment Note    Patient Details  Name: Martin Santiago MRN: [id number] Date of Birth: 1950/11/19  Transition of Care [facility]) CM/SW Contact:    Toy LITTIE Agar, RN Phone Number:[phone]  09/10/2024, 11:43 AM  Clinical Narrative:                 Cm at bedside introduced self and explained role. Cm offered patient choice for home health agencies. Home health choice is [facility].  Home health referral has been accepted by [facility] with [facility]. No other needs noted. Inpatient care manager will sign off.         Patient Goals and CMS Choice            Expected Discharge Plan and Services         Expected Discharge Date: 09/10/24                                    Prior Living Arrangements/Services                       Activities of Daily Living   ADL Screening (condition at time of admission) Independently performs ADLs?: Yes (appropriate for developmental age) Is the patient deaf or have difficulty hearing?: No Does the patient have difficulty seeing, even when wearing glasses/contacts?: No Does the patient have difficulty concentrating, remembering, or making decisions?: No  Permission Sought/Granted                  Emotional Assessment              Admission diagnosis:  Influenza A [J10.1] COPD exacerbation (HCC) [J44.1] Influenza A (H1N1) [J10.1] Patient Active Problem List   Diagnosis Date Noted   Influenza A 09/07/2024   Normocytic anemia 09/07/2024   Hypercalcemia 09/07/2024   Hyperglycemia 09/07/2024   COPD with acute exacerbation (HCC) 09/07/2024   Glaucoma 09/07/2024   S/P endovascular aneurysm repair 04/12/2024   Abdominal aortic aneurysm (AAA) greater than 5.5 cm in diameter in male 04/12/2024   Closed fracture of first cervical vertebra (HCC) 08/02/2023   Acquired kyphosis 08/02/2023   Coronary artery calcification 05/12/2023   Dyslipidemia 05/12/2023   Retinal detachment, right  02/12/2021   Nontraumatic rupture of right proximal biceps tendon 11/16/2020   Lattice degeneration of right retina 10/29/2020   Pseudophakia of right eye 10/29/2020   Combined form of age-related cataract, right eye 06/07/2018   Acute respiratory failure with hypoxia (HCC) 06/14/2017   Tobacco abuse 06/14/2017   HTN (hypertension) 06/14/2017   COPD (chronic obstructive pulmonary disease) (HCC) 06/13/2017   Secondary open-angle glaucoma of left eye, severe stage 01/07/2016   Hx of colonic polyps 11/19/2012   Aphakia, left 10/27/2011   Nuclear cataract 10/27/2011   Retinal detachment of left eye with multiple breaks 10/27/2011   PCP:  [place], Quintin PARAS, MD Pharmacy:   [facility] [place], [place] - [address] [place] [address] Phone: [phone] Fax: [phone]  [id number] - [place], [place] - [address] [place] [address] Phone: [phone] Fax: [phone]  [facility] - [place], [place] - [address] [place] [phone] Phone: [phone] Fax: [phone]  [id number] - [place], [place] - [address]  [place] [address] Phone: [phone] Fax: [phone]  [facility] - [place], [place] - [address] [place] [address] Phone: [phone] Fax: [phone]  [facility] LONG - [facility] [address] [place] [address] Phone: [phone] Fax: [phone]     Social Drivers of Health (SDOH) Social History: SDOH Screenings   Food Insecurity: No Food Insecurity (09/07/2024)  Housing: Low Risk (09/07/2024)  Transportation Needs: No Transportation Needs (09/07/2024)  Utilities: Not At Risk (09/07/2024)  Alcohol Screen: Low Risk (01/18/2024)  Depression (PHQ2-9): Low Risk (06/12/2024)  Financial Resource Strain:  Low Risk (01/18/2024)  Physical Activity: Inactive (01/18/2024)  Social Connections: Moderately Isolated (09/07/2024)  Stress: No Stress Concern Present (01/18/2024)  Tobacco Use: High Risk (09/07/2024)  Health Literacy: Adequate Health Literacy (01/18/2024)   SDOH Interventions:     Readmission Risk Interventions     No data to display

## 2024-09-10 NOTE — Progress Notes (Signed)
°   09/10/24 1141  TOC Brief Assessment  Insurance and Status Reviewed  Patient has primary care physician Yes ([place], Quintin PARAS, MD)  Home environment has been reviewed [place] alone  Prior level of function: Independent  Prior/Current Home Services No current home services  Social Drivers of Health Review SDOH reviewed no interventions necessary  Readmission risk has been reviewed Yes  Transition of care needs transition of care needs identified, TOC will continue to follow (Home health set up)

## 2024-09-11 ENCOUNTER — Telehealth (HOSPITAL_BASED_OUTPATIENT_CLINIC_OR_DEPARTMENT_OTHER): Payer: Self-pay | Admitting: Medical

## 2024-09-11 ENCOUNTER — Telehealth: Payer: Self-pay

## 2024-09-11 NOTE — Telephone Encounter (Signed)
 Copied from CRM #[id number]. Topic: Clinical - Home Health Verbal Orders >> Sep 11, 2024 10:43 AM Deleta RAMAN wrote: [facility]/Agency: [facility] Callback Number: [phone] Service Requested: Physical Therapy Frequency: 1 week 1, 2 week 4, 1 week 3 Any new concerns about the patient? Yes, timolol  (TIMOPTIC ) 0.5 % ophthalmic solution interacting with vensolin and air tree inhaler.also nebulizer solution

## 2024-09-11 NOTE — Patient Instructions (Signed)
 Visit Information  Thank you for taking time to visit with me today. Please don't hesitate to contact me if I can be of assistance to you before our next scheduled telephone appointment.  Our next appointment is by telephone on December 24th at 1:30pm  Following is a copy of your care plan:   Goals Addressed             This Visit's Progress    VBCI Transitions of Care (TOC) Care Plan       Problems:  Recent Hospitalization for treatment of COPD Hospital or ED Adm Risk is 82%  Goal:  Over the next 30 days, the patient will not experience hospital readmission  Interventions:   COPD Interventions: Advised patient to track and manage COPD triggers Discussed the importance of adequate rest and management of fatigue with COPD Provided education about and advised patient to utilize infection prevention strategies to reduce risk of respiratory infection Screening for signs and symptoms of depression related to chronic disease state  Use of home oxygen  The patient was hospitalized with acute respiratory failure due to influenza. Take Tamiflu  and Prednisone  taper as directed Complete Pulmonary CT scan 09/12/24 Oxygen  at 2l/min continuous Wear Nicotine  patch as directed. The patient continues to smoke about four cigarettes a day HHPT with [facility]  Patient Self Care Activities:  Attend all scheduled provider appointments Call pharmacy for medication refills 3-7 days in advance of running out of medications Call provider office for new concerns or questions  Notify RN Care Manager of [facility] call rescheduling needs Participate in Transition of Care Program/Attend [facility] scheduled calls Perform all self care activities independently  Take medications as prescribed    Plan:  Telephone follow up appointment with care management team member scheduled for:  Wednesday December 24th at 1:30pm        Patient verbalizes understanding of instructions and care plan provided today and agrees to  view in MyChart. Active MyChart status and patient understanding of how to access instructions and care plan via MyChart confirmed with patient.     The patient has been provided with contact information for the care management team and has been advised to call with any health related questions or concerns.   Please call the care guide team at [phone] if you need to cancel or reschedule your appointment.   Please call the Suicide and Crisis Lifeline: 988 call the [place]  National Suicide Prevention Lifeline: 903-507-2067 or TTY: 3176259906 TTY 716-414-7365) to talk to a trained counselor if you are experiencing a Mental Health or Behavioral Health Crisis or need someone to talk to.  Medford Balboa, BSN, RN [place]  VBCI - [employer] [facility] RN Care Manager [phone]

## 2024-09-11 NOTE — Telephone Encounter (Signed)
 Called pt he states he is switching doctors due to [facility] being closer to his home

## 2024-09-11 NOTE — Transitions of Care (Post Inpatient/ED Visit) (Signed)
 09/11/2024  Name: Martin Santiago MRN: [id number] DOB: 03/20/1951  Today's TOC FU Call Status: Today's TOC FU Call Status:: Successful TOC FU Call Completed TOC FU Call Complete Date: 09/11/24  Patient's Name and Date of Birth confirmed. Name, DOB  Transition Care Management Follow-up Telephone Call Date of Discharge: 09/10/24 Discharge Facility: [facility]) Type of Discharge: Inpatient Admission Primary Inpatient Discharge Diagnosis:: Influenza How have you been since you were released from the hospital?: Better Any questions or concerns?: No  Items Reviewed: Did you receive and understand the discharge instructions provided?: Yes Medications obtained,verified, and reconciled?: Yes (Medications Reviewed) Any new allergies since your discharge?: No Dietary orders reviewed?: Yes Type of Diet Ordered:: Low Sodium Heart Healthy Do you have support at home?: Yes People in Home [RPT]: alone Name of Support/Comfort Primary Source: [name]  Medications Reviewed Today: Medications Reviewed Today     Reviewed by [name], RN (Case Manager) on 09/11/24 at 1329  Med List Status: <None>   Medication Order Taking? Sig Documenting Provider Last Dose Status Informant  albuterol  (PROVENTIL ) (2.5 MG/3ML) 0.083% nebulizer solution [id number] No Take 3 mLs (2.5 mg total) by nebulization every 4 (four) hours.  Patient taking differently: Take 2.5 mg by nebulization every 4 (four) hours as needed for wheezing or shortness of breath (COPD).   Kara Dorn NOVAK, MD 09/06/2024 Active Self, Pharmacy Records  albuterol  (VENTOLIN  HFA) 108 [phone] Base) MCG/ACT inhaler [id number] No INHALE 2 PUFFS into THE lungs EVERY 6 HOURS AS NEEDED FOR wheezing OR SHORTNESS OF SHERIDA Kara Dorn NOVAK, MD 09/06/2024 Active Self, Pharmacy Records  amLODipine  (NORVASC ) 10 MG tablet [id number] No Take 1 tablet (10 mg total) by mouth daily. Needs appt Dorina [place] RIGGERS 09/06/2024 Active Self,  Pharmacy Records  aspirin  EC 81 MG tablet [id number] No Take 1 tablet (81 mg total) by mouth daily. Swallow whole. Krasowski, Robert J, MD 09/06/2024 Active Self, Pharmacy Records  atorvastatin  (LIPITOR) 20 MG tablet [id number] No Take 1 tablet (20 mg total) by mouth daily. Patient needs an appointment for further refills. 2 nd attempt Bernie Lamar PARAS, MD 09/06/2024 Active Self, Pharmacy Records  azithromycin  (ZITHROMAX ) 250 MG tablet [id number] No Take 1 tablet (250 mg total) by mouth daily. Kara Dorn NOVAK, MD 09/06/2024 Active Self, Pharmacy Records  BREZTRI  AEROSPHERE 160-9-4.8 MCG/ACT AERO [id number] No INHALE 2 PUFFS INTO THE LUNGS IN THE MORNING AND AT BEDTIME Kara Dorn NOVAK, MD 09/06/2024 Active Self, Pharmacy Records  Ensifentrine  (OHTUVAYRE ) 3 MG/2.5ML SUSP [id number] No Inhale 3 mg into the lungs in the morning and at bedtime. Kara Dorn NOVAK, MD 09/06/2024 Active Self, Pharmacy Records           Med Note [facility]   Mon Apr 22, 2024 10:55 AM)    losartan  (COZAAR ) 100 MG tablet [id number] No TAKE 1 TABLET BY MOUTH ONCE DAILY (need office appointment) Saguier, Edward, PA-C 09/06/2024 Active Self, Pharmacy Records  nicotine  (NICODERM CQ  - DOSED IN MG/24 HOURS) 21 mg/24hr patch [id number] No Place 21 mg onto the skin daily. [provider] 09/06/2024 Active Self, Pharmacy Records  omeprazole  (PRILOSEC) 20 MG capsule [id number] No Take 1 capsule (20 mg total) by mouth daily. Saguier, Edward, PA-C 09/06/2024 Active Self, Pharmacy Records  oseltamivir  (TAMIFLU ) 75 MG capsule [id number]  Take 1 capsule (75 mg total) by mouth 2 (two) times daily for 2 days. Odell Celinda Balo, MD  Active   OXYGEN  [id number]  Inhale 2 L/min into the lungs continuous. [provider]  Active Self, Pharmacy Records  predniSONE  (DELTASONE ) 10 MG tablet [id number]  Takes 6 tablets for 1 day, then 5 tablets for 1 day, then 4 tablets for 1 day, then 3 tablets for 1 day, then 2 tablets for 1  day, then 1 tablet for 1 day, and then stop. Odell Celinda Balo, MD  Active   sildenafil  (REVATIO ) 20 MG tablet [id number] No 3-5 tab prior to sex.(Max use once in 24 hour period) Saguier, [place], PA-C Past Month Active Self, Pharmacy Records  tamsulosin  (FLOMAX ) 0.4 MG CAPS capsule [id number] No Take 0.4 mg by mouth in the morning. [provider] 09/06/2024 Active Self, Pharmacy Records  timolol  (TIMOPTIC ) 0.5 % ophthalmic solution [id number] No Place 1 drop into the right eye daily. [provider] 09/06/2024 Active Self, Pharmacy Records            Home Care and Equipment/Supplies: Were Home Health Services Ordered?: Yes Name of Home Health Agency:: [facility] Has Agency set up a time to come to your home?: No EMR reviewed for Home Health Orders: Orders present/patient has not received call (refer to CM for follow-up) Any new equipment or medical supplies ordered?: No  Functional Questionnaire: Do you need assistance with bathing/showering or dressing?: No Do you need assistance with meal preparation?: Yes (The patient lives in Independent Living) Do you need assistance with eating?: No Do you have difficulty maintaining continence: No Do you need assistance with getting out of bed/getting out of a chair/moving?: No Do you have difficulty managing or taking your medications?: No  Follow up appointments reviewed: PCP Follow-up appointment confirmed?: Yes (The patient is starting a new PCP 10/01/24) Date of PCP follow-up appointment?: 10/01/24 Follow-up Provider: [facility]in [facility] Follow-up appointment confirmed?: NA Do you need transportation to your follow-up appointment?: No Do you understand care options if your condition(s) worsen?: Yes-patient verbalized understanding  SDOH Interventions Today    Flowsheet Row Most Recent Value  SDOH Interventions   Food Insecurity Interventions Intervention Not Indicated  Housing Interventions Intervention  Not Indicated  Transportation Interventions Intervention Not Indicated  Utilities Interventions Intervention Not Indicated    Goals Addressed             This Visit's Progress    VBCI Transitions of Care (TOC) Care Plan       Problems:  Recent Hospitalization for treatment of COPD Hospital or ED Adm Risk is 82%  Goal:  Over the next 30 days, the patient will not experience hospital readmission  Interventions:   COPD Interventions: Advised patient to track and manage COPD triggers Discussed the importance of adequate rest and management of fatigue with COPD Provided education about and advised patient to utilize infection prevention strategies to reduce risk of respiratory infection Screening for signs and symptoms of depression related to chronic disease state  Use of home oxygen  The patient was hospitalized with acute respiratory failure due to influenza. Take Tamiflu  and Prednisone  taper as directed Complete Pulmonary CT scan 09/12/24 Oxygen  at 2l/min continuous Wear Nicotine  patch as directed. The patient continues to smoke about four cigarettes a day [facility] with [facility]  Patient Self Care Activities:  Attend all scheduled provider appointments Call pharmacy for medication refills 3-7 days in advance of running out of medications Call provider office for new concerns or questions  Notify RN Care Manager of TOC call rescheduling needs Participate in Transition of Care Program/Attend TOC scheduled calls Perform all self care activities independently  Take medications as prescribed  Plan:  Telephone follow up appointment with care management team member scheduled for:  Wednesday December 24th at 1:30pm        Medford Balboa, BSN, RN [place]  VBCI - [facility] RN Care Manager [phone]

## 2024-09-12 ENCOUNTER — Ambulatory Visit (HOSPITAL_BASED_OUTPATIENT_CLINIC_OR_DEPARTMENT_OTHER): Admission: RE | Admit: 2024-09-12 | Discharge: 2024-09-12 | Attending: Acute Care | Admitting: Acute Care

## 2024-09-12 DIAGNOSIS — R911 Solitary pulmonary nodule: Secondary | ICD-10-CM | POA: Insufficient documentation

## 2024-09-17 ENCOUNTER — Encounter: Payer: Self-pay | Admitting: Pulmonary Disease

## 2024-09-17 DIAGNOSIS — J432 Centrilobular emphysema: Secondary | ICD-10-CM

## 2024-09-17 MED ORDER — BREZTRI AEROSPHERE 160-9-4.8 MCG/ACT IN AERO: 2.0000 | INHALATION_SPRAY | Freq: Two times a day (BID) | RESPIRATORY_TRACT | 5 refills | Status: DC

## 2024-09-18 ENCOUNTER — Other Ambulatory Visit: Payer: Self-pay

## 2024-09-18 NOTE — Transitions of Care (Post Inpatient/ED Visit) (Signed)
 " Transition of Care week 2  Visit Note  09/18/2024  Name: Martin Santiago MRN: [id number]          DOB: 07-Feb-1951  Situation: Patient enrolled in [facility] 30-day program. Visit completed with Meir Berzins by telephone.   Background:   Initial Transition Care Management Follow-up Telephone Call Discharge Date and Diagnosis: 09/10/24, Influenza   Past Medical History:  Diagnosis Date   AAA (abdominal aortic aneurysm)    9.8 cm 04/10/24 CTA   Closed C1 fracture (HCC) 05/28/2023   Colon polyps    COPD (chronic obstructive pulmonary disease) (HCC)    Crohn's disease (HCC)    Detached retina    Essential hypertension    Tropical sprue     Assessment: Patient Reported Symptoms: Cognitive Cognitive Status: Alert and oriented to person, place, and time, Normal speech and language skills      Neurological Neurological Review of Symptoms: No symptoms reported    HEENT HEENT Symptoms Reported: No symptoms reported      Cardiovascular Cardiovascular Symptoms Reported: No symptoms reported Cardiovascular Management Strategies: Medication therapy, Routine screening, Activity, Coping strategies  Respiratory Respiratory Symptoms Reported: Productive cough, Shortness of breath, Wheezing Other Respiratory Symptoms: SOB exertion Additional Respiratory Details: He has finished his Prednisone  Respiratory Management Strategies: Routine screening, Medication therapy, Oxygen  therapy, Activity, Coping strategies, Adequate rest Respiratory Self-Management Outcome: 3 (uncertain)  Endocrine Endocrine Symptoms Reported: No symptoms reported Is patient diabetic?: No    Gastrointestinal Gastrointestinal Symptoms Reported: No symptoms reported      Genitourinary Genitourinary Symptoms Reported: No symptoms reported    Integumentary Integumentary Symptoms Reported: No symptoms reported    Musculoskeletal Musculoskelatal Symptoms Reviewed: Limited mobility, Difficulty walking, Unsteady gait,  Weakness Additional Musculoskeletal Details: Uses an electric scooter Musculoskeletal Management Strategies: Exercise Musculoskeletal Comment: HHPT      Psychosocial Psychosocial Symptoms Reported: No symptoms reported         There were no vitals filed for this visit.    Medications Reviewed Today     Reviewed by [name], RN (Case Manager) on 09/18/24 at 1337  Med List Status: <None>   Medication Order Taking? Sig Documenting Provider Last Dose Status Informant  albuterol  (PROVENTIL ) (2.5 MG/3ML) 0.083% nebulizer solution [id number]  Take 3 mLs (2.5 mg total) by nebulization every 4 (four) hours.  Patient taking differently: Take 2.5 mg by nebulization every 4 (four) hours as needed for wheezing or shortness of breath (COPD).   Kara Dorn NOVAK, MD  Active Self, Pharmacy Records  albuterol  (VENTOLIN  HFA) 108 [phone] Base) MCG/ACT inhaler [id number]  INHALE 2 PUFFS into THE lungs EVERY 6 HOURS AS NEEDED FOR wheezing OR SHORTNESS OF SHERIDA Kara Dorn NOVAK, MD  Active Self, Pharmacy Records  amLODipine  (NORVASC ) 10 MG tablet [id number]  Take 1 tablet (10 mg total) by mouth daily. Needs appt SaguierD[place] RIGGERS  Active Self, Pharmacy Records  aspirin  EC 81 MG tablet [id number]  Take 1 tablet (81 mg total) by mouth daily. Swallow whole. Krasowski, Robert J, MD  Active Self, Pharmacy Records  atorvastatin  (LIPITOR) 20 MG tablet [id number]  Take 1 tablet (20 mg total) by mouth daily. Patient needs an appointment for further refills. 2 nd attempt Bernie Lamar PARAS, MD  Active Self, Pharmacy Records  azithromycin  (ZITHROMAX ) 250 MG tablet [id number]  Take 1 tablet (250 mg total) by mouth daily. Kara Dorn NOVAK, MD  Active Self, Pharmacy Records  budesonide -glycopyrrolate -formoterol  (BREZTRI  AEROSPHERE) 160-9-4.8 MCG/ACT AERO inhaler [id number]  Inhale 2 puffs into the lungs  in the morning and at bedtime. Kara Dorn NOVAK, MD  Active   Ensifentrine  (OHTUVAYRE ) 3 MG/2.5ML SUSP  [id number]  Inhale 3 mg into the lungs in the morning and at bedtime. Kara Dorn NOVAK, MD  Active Self, Pharmacy Records           Med Note [facility]   Mon Apr 22, 2024 10:55 AM)    losartan  (COZAAR ) 100 MG tablet [id number]  TAKE 1 TABLET BY MOUTH ONCE DAILY (need office appointment) Saguier, Edward, PA-C  Active Self, Pharmacy Records  nicotine  (NICODERM CQ  - DOSED IN MG/24 HOURS) 21 mg/24hr patch [id number]  Place 21 mg onto the skin daily. [provider]  Active Self, Pharmacy Records  omeprazole  (PRILOSEC) 20 MG capsule [id number]  Take 1 capsule (20 mg total) by mouth daily. Saguier, Edward, PA-C  Active Self, Pharmacy Records  OXYGEN  [id number]  Inhale 2 L/min into the lungs continuous. [provider]  Active Self, Pharmacy Records  predniSONE  (DELTASONE ) 10 MG tablet [id number]  Takes 6 tablets for 1 day, then 5 tablets for 1 day, then 4 tablets for 1 day, then 3 tablets for 1 day, then 2 tablets for 1 day, then 1 tablet for 1 day, and then stop. Odell Celinda Balo, MD  Active   sildenafil  (REVATIO ) 20 MG tablet [id number]  3-5 tab prior to sex.(Max use once in 24 hour period) Saguier, [place], PA-C  Active Self, Pharmacy Records  tamsulosin  (FLOMAX ) 0.4 MG CAPS capsule [id number]  Take 0.4 mg by mouth in the morning. [provider]  Active Self, Pharmacy Records  timolol  (TIMOPTIC ) 0.5 % ophthalmic solution [id number]  Place 1 drop into the right eye daily. [provider]  Active Self, Pharmacy Records            Recommendation:   Continue Current Plan of Care  Follow Up Plan:   Telephone follow-up in 1 week  Medford Balboa, BSN, RN [place]  VBCI - [facility] RN Care Manager [phone]     "

## 2024-09-18 NOTE — Patient Instructions (Signed)
 Visit Information  Thank you for taking time to visit with me today. Please don't hesitate to contact me if I can be of assistance to you before our next scheduled telephone appointment.  Our next appointment is by telephone on Tuesday December 30th at 2:30pm  Following is a copy of your care plan:   Goals Addressed             This Visit's Progress    VBCI Transitions of Care (TOC) Care Plan       Problems: (reviewed 09/18/24) Recent Hospitalization for treatment of COPD Hospital or ED Adm Risk is 82%  Goal: (reviewed 09/18/24) Over the next 30 days, the patient will not experience hospital readmission  Interventions: (reviewed 09/18/24)  COPD Interventions: Advised patient to track and manage COPD triggers Discussed the importance of adequate rest and management of fatigue with COPD Provided education about and advised patient to utilize infection prevention strategies to reduce risk of respiratory infection Screening for signs and symptoms of depression related to chronic disease state  Use of home oxygen  The patient was hospitalized with acute respiratory failure due to influenza. Take Tamiflu  and Prednisone  taper as directed Complete Pulmonary CT scan 09/12/24 Oxygen  at 2l/min continuous Wear Nicotine  patch as directed. The patient continues to smoke about four cigarettes a day HHPT with [facility]  Patient Self Care Activities: (reviewed 09/18/24) Attend all scheduled provider appointments Call pharmacy for medication refills 3-7 days in advance of running out of medications Call provider office for new concerns or questions  Notify RN Care Manager of [facility] call rescheduling needs Participate in Transition of Care Program/Attend [facility] scheduled calls Perform all self care activities independently  Take medications as prescribed    Plan:  Telephone follow up appointment with care management team member scheduled for:   Tuesday December 30th at 2:30pm        Patient  verbalizes understanding of instructions and care plan provided today and agrees to view in MyChart. Active MyChart status and patient understanding of how to access instructions and care plan via MyChart confirmed with patient.     The patient has been provided with contact information for the care management team and has been advised to call with any health related questions or concerns.   Please call the care guide team at [phone] if you need to cancel or reschedule your appointment.   Please call the Suicide and Crisis Lifeline: 988 call the [place]  National Suicide Prevention Lifeline: 918-614-2412 or TTY: 727-434-1779 TTY 6364886687) to talk to a trained counselor if you are experiencing a Mental Health or Behavioral Health Crisis or need someone to talk to.  Medford Balboa, BSN, RN [place]  VBCI - [employer] [facility] RN Care Manager [phone]

## 2024-09-23 ENCOUNTER — Other Ambulatory Visit: Payer: Self-pay

## 2024-09-23 DIAGNOSIS — F17210 Nicotine dependence, cigarettes, uncomplicated: Secondary | ICD-10-CM

## 2024-09-23 DIAGNOSIS — Z87891 Personal history of nicotine dependence: Secondary | ICD-10-CM

## 2024-09-23 DIAGNOSIS — Z122 Encounter for screening for malignant neoplasm of respiratory organs: Secondary | ICD-10-CM

## 2024-09-24 ENCOUNTER — Other Ambulatory Visit: Payer: Self-pay

## 2024-09-24 NOTE — Patient Instructions (Signed)
 Visit Information  Thank you for taking time to visit with me today. Please don't hesitate to contact me if I can be of assistance to you before our next scheduled telephone appointment.  Our next appointment is by telephone on Thursday January 8th at 2:00pm  Following is a copy of your care plan:   Goals Addressed             This Visit's Progress    VBCI Transitions of Care (TOC) Care Plan       Problems: (reviewed 09/24/24) Recent Hospitalization for treatment of COPD Hospital or ED Adm Risk is 82%  Goal: (reviewed 09/24/24) Over the next 30 days, the patient will not experience hospital readmission  Interventions: (reviewed 09/24/24)  COPD Interventions: Advised patient to track and manage COPD triggers Discussed the importance of adequate rest and management of fatigue with COPD Provided education about and advised patient to utilize infection prevention strategies to reduce risk of respiratory infection Screening for signs and symptoms of depression related to chronic disease state  Use of home oxygen  The patient was hospitalized with acute respiratory failure due to influenza. Take Tamiflu  and Prednisone  taper as directed Complete Pulmonary CT scan 09/12/24 Oxygen  at 2l/min continuous Wear Nicotine  patch as directed. The patient continues to smoke about four cigarettes a day HHPT with [facility]  Patient Self Care Activities: (reviewed 09/24/24) Attend all scheduled provider appointments Call pharmacy for medication refills 3-7 days in advance of running out of medications Call provider office for new concerns or questions  Notify RN Care Manager of [facility] call rescheduling needs Participate in Transition of Care Program/Attend [facility] scheduled calls Perform all self care activities independently  Take medications as prescribed    Plan:  Telephone follow up appointment with care management team member scheduled for:   Thursday January 8th at 2:00pm        Patient  verbalizes understanding of instructions and care plan provided today and agrees to view in MyChart. Active MyChart status and patient understanding of how to access instructions and care plan via MyChart confirmed with patient.     The patient has been provided with contact information for the care management team and has been advised to call with any health related questions or concerns.   Please call the care guide team at [phone] if you need to cancel or reschedule your appointment.   Please call the Suicide and Crisis Lifeline: 988 call the [place]  National Suicide Prevention Lifeline: 2281707315 or TTY: 7853510416 TTY (458) 092-0565) to talk to a trained counselor if you are experiencing a Mental Health or Behavioral Health Crisis or need someone to talk to.  Medford Balboa, BSN, RN [place]  VBCI - [employer] [facility] RN Care Manager [phone]

## 2024-09-24 NOTE — Transitions of Care (Post Inpatient/ED Visit) (Signed)
 " Transition of Care week 3  Visit Note  09/24/2024  Name: Martin Santiago MRN: [id number]          DOB: Oct 20, 1950  Situation: Patient enrolled in [facility] 30-day program. Visit completed with Martin Santiago by telephone.   Background:   Initial Transition Care Management Follow-up Telephone Call Discharge Date and Diagnosis: 09/10/24, Influenza   Past Medical History:  Diagnosis Date   AAA (abdominal aortic aneurysm)    9.8 cm 04/10/24 CTA   Closed C1 fracture (HCC) 05/28/2023   Colon polyps    COPD (chronic obstructive pulmonary disease) (HCC)    Crohn's disease (HCC)    Detached retina    Essential hypertension    Tropical sprue     Assessment: Patient Reported Symptoms: Cognitive Cognitive Status: Alert and oriented to person, place, and time, Normal speech and language skills      Neurological Neurological Review of Symptoms: No symptoms reported    HEENT HEENT Symptoms Reported: No symptoms reported      Cardiovascular Cardiovascular Symptoms Reported: No symptoms reported Cardiovascular Management Strategies: Medication therapy, Routine screening, Activity, Coping strategies  Respiratory Respiratory Symptoms Reported: Wheezing, Shortness of breath, Productive cough Other Respiratory Symptoms: SOB on exertion Respiratory Management Strategies: Routine screening, Medication therapy, Oxygen  therapy, Activity, Adequate rest  Endocrine Endocrine Symptoms Reported: No symptoms reported Is patient diabetic?: No    Gastrointestinal Gastrointestinal Symptoms Reported: No symptoms reported      Genitourinary Genitourinary Symptoms Reported: No symptoms reported    Integumentary Integumentary Symptoms Reported: No symptoms reported    Musculoskeletal Musculoskelatal Symptoms Reviewed: Limited mobility, Unsteady gait, Weakness Additional Musculoskeletal Details: Uses an electric scooter to get around Musculoskeletal Management Strategies: Exercise Musculoskeletal Comment:  HHPT      Psychosocial Psychosocial Symptoms Reported: No symptoms reported         There were no vitals filed for this visit.    Medications Reviewed Today     Reviewed by [name], RN (Case Manager) on 09/24/24 at 1428  Med List Status: <None>   Medication Order Taking? Sig Documenting Provider Last Dose Status Informant  albuterol  (PROVENTIL ) (2.5 MG/3ML) 0.083% nebulizer solution [id number] No Take 3 mLs (2.5 mg total) by nebulization every 4 (four) hours.  Patient taking differently: Take 2.5 mg by nebulization every 4 (four) hours as needed for wheezing or shortness of breath (COPD).   Kara Dorn NOVAK, MD 09/06/2024 Active Self, Pharmacy Records  albuterol  (VENTOLIN  HFA) 108 [phone] Base) MCG/ACT inhaler [id number] No INHALE 2 PUFFS into THE lungs EVERY 6 HOURS AS NEEDED FOR wheezing OR SHORTNESS OF SHERIDA Kara Dorn NOVAK, MD 09/06/2024 Active Self, Pharmacy Records  amLODipine  (NORVASC ) 10 MG tablet [id number] No Take 1 tablet (10 mg total) by mouth daily. Needs appt Dorina Dallas RIGGERS 09/06/2024 Active Self, Pharmacy Records  aspirin  EC 81 MG tablet [id number] No Take 1 tablet (81 mg total) by mouth daily. Swallow whole. Krasowski, Robert J, MD 09/06/2024 Active Self, Pharmacy Records  atorvastatin  (LIPITOR) 20 MG tablet [id number] No Take 1 tablet (20 mg total) by mouth daily. Patient needs an appointment for further refills. 2 nd attempt Bernie Lamar PARAS, MD 09/06/2024 Active Self, Pharmacy Records  azithromycin  (ZITHROMAX ) 250 MG tablet [id number] No Take 1 tablet (250 mg total) by mouth daily. Kara Dorn NOVAK, MD 09/06/2024 Active Self, Pharmacy Records  budesonide -glycopyrrolate -formoterol  (BREZTRI  AEROSPHERE) 160-9-4.8 MCG/ACT AERO inhaler [id number]  Inhale 2 puffs into the lungs in the morning and at bedtime. Kara Dorn NOVAK, MD  Active  Ensifentrine  (OHTUVAYRE ) 3 MG/2.5ML SUSP [id number] No Inhale 3 mg into the lungs in the morning and at bedtime. Kara Dorn NOVAK, MD 09/06/2024 Active Self, Pharmacy Records           Med Note [facility], [facility]   Mon Apr 22, 2024 10:55 AM)    losartan  (COZAAR ) 100 MG tablet [id number] No TAKE 1 TABLET BY MOUTH ONCE DAILY (need office appointment) Saguier, Edward, PA-C 09/06/2024 Active Self, Pharmacy Records  nicotine  (NICODERM CQ  - DOSED IN MG/24 HOURS) 21 mg/24hr patch [id number] No Place 21 mg onto the skin daily. [provider] 09/06/2024 Active Self, Pharmacy Records  omeprazole  (PRILOSEC) 20 MG capsule [id number] No Take 1 capsule (20 mg total) by mouth daily. Saguier, Edward, PA-C 09/06/2024 Active Self, Pharmacy Records  OXYGEN  [id number]  Inhale 2 L/min into the lungs continuous. [provider]  Active Self, Pharmacy Records  predniSONE  (DELTASONE ) 10 MG tablet [id number]  Takes 6 tablets for 1 day, then 5 tablets for 1 day, then 4 tablets for 1 day, then 3 tablets for 1 day, then 2 tablets for 1 day, then 1 tablet for 1 day, and then stop.  Patient not taking: Reported on 09/18/2024   Odell Celinda Balo, MD  Active   sildenafil  (REVATIO ) 20 MG tablet [id number] No 3-5 tab prior to sex.(Max use once in 24 hour period) Saguier, [place], PA-C Past Month Active Self, Pharmacy Records  tamsulosin  (FLOMAX ) 0.4 MG CAPS capsule [id number] No Take 0.4 mg by mouth in the morning. [provider] 09/06/2024 Active Self, Pharmacy Records  timolol  (TIMOPTIC ) 0.5 % ophthalmic solution [id number] No Place 1 drop into the right eye daily. [provider] 09/06/2024 Active Self, Pharmacy Records            Recommendation:   Continue Current Plan of Care  Follow Up Plan:   Telephone follow-up in 1 week  Medford Balboa, BSN, RN   VBCI - [facility] RN Care Manager [phone]     "

## 2024-09-30 ENCOUNTER — Telehealth: Payer: Self-pay

## 2024-09-30 ENCOUNTER — Other Ambulatory Visit (HOSPITAL_COMMUNITY): Payer: Self-pay

## 2024-09-30 NOTE — Telephone Encounter (Signed)
*  Pulm  Pharmacy Patient Advocate Encounter   Received notification from Fax that prior authorization for Breztri  is required/requested.   Insurance verification completed.   The patient is insured through [employer].   Per test claim:  Trelegy Ellipta is preferred by the insurance.  If suggested medication is appropriate, Please send in a new RX and discontinue this one. If not, please advise as to why it's not appropriate so that we may request a Prior Authorization. Please note, some preferred medications may still require a PA.  If the suggested medications have not been trialed and there are no contraindications to their use, the PA will not be submitted, as it will not be approved.   Key: Martin Santiago

## 2024-10-01 ENCOUNTER — Encounter (HOSPITAL_BASED_OUTPATIENT_CLINIC_OR_DEPARTMENT_OTHER): Payer: Self-pay | Admitting: Family Medicine

## 2024-10-01 ENCOUNTER — Ambulatory Visit (INDEPENDENT_AMBULATORY_CARE_PROVIDER_SITE_OTHER): Admitting: Family Medicine

## 2024-10-01 VITALS — BP 135/87 | HR 80 | Temp 97.6°F | Resp 22 | Ht 66.0 in | Wt 140.0 lb

## 2024-10-01 DIAGNOSIS — E785 Hyperlipidemia, unspecified: Secondary | ICD-10-CM

## 2024-10-01 DIAGNOSIS — I10 Essential (primary) hypertension: Secondary | ICD-10-CM

## 2024-10-01 DIAGNOSIS — D649 Anemia, unspecified: Secondary | ICD-10-CM | POA: Diagnosis not present

## 2024-10-01 DIAGNOSIS — E538 Deficiency of other specified B group vitamins: Secondary | ICD-10-CM

## 2024-10-01 NOTE — Assessment & Plan Note (Signed)
 Blood pressure borderline in office today.  Can continue with current medication regimen including amlodipine  and losartan , no changes with medication today. Recommend intermittent monitoring blood pressure at home, DASH diet.

## 2024-10-01 NOTE — Assessment & Plan Note (Signed)
 Normocytic on recent labs.  Patient with history of iron  deficiency and B12 deficiency in the past related to his history of Crohn's as well as intestinal resection in the past. We can plan to check labs including B12 and iron  levels to monitor for any current deficiencies.

## 2024-10-01 NOTE — Progress Notes (Signed)
 "  New Patient Office Visit  Subjective   Patient ID: Martin Santiago, male    DOB: 1950-12-24  Age: 74 y.o. MRN: [id number]  CC:  Chief Complaint  Patient presents with   Establish Care    HPI Martin Santiago presents to establish care Last PCP - Dallas Maxwell  Discussed the use of AI scribe software for clinical note transcription with the patient, who gave verbal consent to proceed.  History of Present Illness Martin Santiago is a 74 year old male with Crohn's disease who presents for follow-up on recent hospitalization and medication management.  He has a history of Crohn's disease diagnosed in 1995 following exploratory surgery, during which a section of the intestine at the junction of the small and large intestines was removed. He initially received monthly B12 injections post-surgery, which were beneficial at the time, but has not been consistent with them in recent years and does not notice much difference when receiving them now.  He was recently hospitalized for three and a half days due to difficulty breathing, which was attributed to the flu. No vomiting, tiredness, or fever were present during this episode. He speculates that the high-dose flu shot he received may have resulted in a milder case.  He has a history of a lung nodule that was being monitored. A recent lung scan was performed to follow up on a nodule that had been identified six months earlier; the patient has not yet received feedback from his pulmonologist regarding these results. He has not yet received feedback from his pulmonologist regarding these results.  He is currently using Breztri , which he finds helpful for his condition. He has been receiving it free through a program with [employer], but recently received a letter stating he is no longer eligible. He is awaiting a refill and has been in contact with his pulmonologist's office to facilitate this process.  He has not had recent blood work done due to  logistical issues with his previous primary care provider's office, which required him to return to their location for lab work. He mentions that he was unable to get a satisfactory explanation for this requirement and thus did not complete the blood work. He is due for labs including a cholesterol panel, metabolic panel, B12 levels, and blood cell count.  He resides at [facility].  Outpatient Encounter Medications as of 10/01/2024  Medication Sig   albuterol  (PROVENTIL ) (2.5 MG/3ML) 0.083% nebulizer solution Take 3 mLs (2.5 mg total) by nebulization every 4 (four) hours. (Patient taking differently: Take 2.5 mg by nebulization every 4 (four) hours as needed for wheezing or shortness of breath (COPD).)   albuterol  (VENTOLIN  HFA) 108 (90 Base) MCG/ACT inhaler INHALE 2 PUFFS into THE lungs EVERY 6 HOURS AS NEEDED FOR wheezing OR SHORTNESS OF BREATH   amLODipine  (NORVASC ) 10 MG tablet Take 1 tablet (10 mg total) by mouth daily. Needs appt   aspirin  EC 81 MG tablet Take 1 tablet (81 mg total) by mouth daily. Swallow whole.   atorvastatin  (LIPITOR) 20 MG tablet Take 1 tablet (20 mg total) by mouth daily. Patient needs an appointment for further refills. 2 nd attempt   azithromycin  (ZITHROMAX ) 250 MG tablet Take 1 tablet (250 mg total) by mouth daily.   budesonide -glycopyrrolate -formoterol  (BREZTRI  AEROSPHERE) 160-9-4.8 MCG/ACT AERO inhaler Inhale 2 puffs into the lungs in the morning and at bedtime.   Ensifentrine  (OHTUVAYRE ) 3 MG/2.5ML SUSP Inhale 3 mg into the lungs in the morning and at bedtime.  losartan  (COZAAR ) 100 MG tablet TAKE 1 TABLET BY MOUTH ONCE DAILY (need office appointment)   nicotine  (NICODERM CQ  - DOSED IN MG/24 HOURS) 21 mg/24hr patch Place 21 mg onto the skin daily.   omeprazole  (PRILOSEC) 20 MG capsule Take 1 capsule (20 mg total) by mouth daily.   OXYGEN  Inhale 2 L/min into the lungs continuous.   Pseudoephedrine-Guaifenesin  (MUCINEX  D MAX STRENGTH) [phone] MG TB12 Take 1  tablet by mouth every 12 (twelve) hours as needed (chest congestion).   sildenafil  (REVATIO ) 20 MG tablet 3-5 tab prior to sex.(Max use once in 24 hour period)   tamsulosin  (FLOMAX ) 0.4 MG CAPS capsule Take 0.4 mg by mouth in the morning.   timolol  (TIMOPTIC ) 0.5 % ophthalmic solution Place 1 drop into the right eye daily.   [DISCONTINUED] predniSONE  (DELTASONE ) 10 MG tablet Takes 6 tablets for 1 day, then 5 tablets for 1 day, then 4 tablets for 1 day, then 3 tablets for 1 day, then 2 tablets for 1 day, then 1 tablet for 1 day, and then stop. (Patient not taking: Reported on 10/01/2024)   No facility-administered encounter medications on file as of 10/01/2024.    Past Medical History:  Diagnosis Date   AAA (abdominal aortic aneurysm)    9.8 cm 04/10/24 CTA   Allergy    Closed C1 fracture (HCC) 05/28/2023   Colon polyps    COPD (chronic obstructive pulmonary disease) (HCC)    Crohn's disease (HCC)    Detached retina    Emphysema of lung (HCC)    Essential hypertension    GERD (gastroesophageal reflux disease)    Oxygen  deficiency    Tropical sprue     Past Surgical History:  Procedure Laterality Date   ABDOMINAL AORTIC ENDOVASCULAR STENT GRAFT N/A 04/12/2024   Procedure: INSERTION, ENDOVASCULAR STENT GRAFT, AORTA, ABDOMINAL;  Surgeon: Sheree Penne Bruckner, MD;  Location: [facility] OR;  Service: Vascular;  Laterality: N/A;   APPENDECTOMY     BOWEL RESECTION  05/27/1994   [place]:  ? Crohn's   COLON SURGERY     COLONOSCOPY     Dr Karena, [facility]:  Hx polyps. Cannot remmeber date   COLONOSCOPY N/A 12/03/2012   Procedure: COLONOSCOPY;  Surgeon: Lamar CHRISTELLA Hollingshead, MD;  [facility];  Service: Endoscopy;  Laterality: N/A;  7:30   EYE SURGERY     INGUINAL HERNIA REPAIR Right    REPAIR ILIAC ARTERY Right 04/12/2024   Procedure: REPAIR, ARTERY, FEMORAL;  Surgeon: Sheree Penne Bruckner, MD;  Location: [facility] OR;  Service: Vascular;  Laterality: Right;   RETINAL DETACHMENT SURGERY   07/27/2006   RETINAL DETACHMENT SURGERY  09/26/2006   RETINAL DETACHMENT SURGERY  01/25/2007   SMALL INTESTINE SURGERY     TONSILLECTOMY     ULTRASOUND GUIDANCE FOR VASCULAR ACCESS Bilateral 04/12/2024   Procedure: ULTRASOUND GUIDANCE, FOR VASCULAR ACCESS;  Surgeon: Sheree Penne Bruckner, MD;  Location: [facility] OR;  Service: Vascular;  Laterality: Bilateral;    Family History  Problem Relation Age of Onset   Colon cancer Maternal Aunt    Breast cancer Mother    Hypertension Father     Social History   Socioeconomic History   Marital status: Widowed    Spouse name: Not on file   Number of children: 0   Years of education: Not on file   Highest education level: Bachelor's degree (e.g., BA, AB, BS)  Occupational History   Occupation: retired, counsellor, Engineer, Agricultural: SELF EMPLOYED  Tobacco Use   Smoking status: Every Day    Current packs/day: 2.00    Average packs/day: 2.0 packs/day for 55.0 years (110.0 ttl pk-yrs)    Types: Cigarettes    Passive exposure: Past   Smokeless tobacco: Never   Tobacco comments:    Smoke 1/4 pack a day. Tay 04/19/22  Vaping Use   Vaping status: Never Used  Substance and Sexual Activity   Alcohol use: Not Currently    Comment: Rare, 3-4 per yr   Drug use: No    Comment: Remote marijuana   Sexual activity: Yes    Birth control/protection: None  Other Topics Concern   Not on file  Social History Narrative   Wife, Devere passed away in 12/25/21 from [place].   1 step-daughter who lives in [place].   Social Drivers of Health   Tobacco Use: High Risk (10/01/2024)   Patient History    Smoking Tobacco Use: Every Day    Smokeless Tobacco Use: Never    Passive Exposure: Past  Financial Resource Strain: Low Risk (01/18/2024)   Overall Financial Resource Strain (CARDIA)    Difficulty of Paying Living Expenses: Not hard at all  Food Insecurity: No Food Insecurity (09/11/2024)   Epic    Worried About Programme Researcher, Broadcasting/film/video in  the Last Year: Never true    Ran Out of Food in the Last Year: Never true  Transportation Needs: No Transportation Needs (09/11/2024)   Epic    Lack of Transportation (Medical): No    Lack of Transportation (Non-Medical): No  Physical Activity: Inactive (01/18/2024)   Exercise Vital Sign    Days of Exercise per Week: 0 days    Minutes of Exercise per Session: 0 min  Stress: No Stress Concern Present (01/18/2024)   [employer] of Occupational Health - Occupational Stress Questionnaire    Feeling of Stress : Not at all  Social Connections: Moderately Isolated (09/07/2024)   Social Connection and Isolation Panel    Frequency of Communication with Friends and Family: More than three times a week    Frequency of Social Gatherings with Friends and Family: More than three times a week    Attends Religious Services: 1 to 4 times per year    Active Member of [employer] or Organizations: No    Attends Banker Meetings: Not on file    Marital Status: Widowed  Intimate Partner Violence: Not At Risk (09/11/2024)   Epic    Fear of Current or Ex-Partner: No    Emotionally Abused: No    Physically Abused: No    Sexually Abused: No  Depression (PHQ2-9): Low Risk (06/12/2024)   Depression (PHQ2-9)    PHQ-2 Score: 3  Alcohol Screen: Low Risk (01/18/2024)   Alcohol Screen    Last Alcohol Screening Score (AUDIT): 0  Housing: Unknown (09/11/2024)   Epic    Unable to Pay for Housing in the Last Year: No    Number of Times Moved in the Last Year: Not on file    Homeless in the Last Year: No  Utilities: Not At Risk (09/11/2024)   Epic    Threatened with loss of utilities: No  Health Literacy: Adequate Health Literacy (01/18/2024)   B1300 Health Literacy    Frequency of need for help with medical instructions: Never    Objective   BP 135/87 (BP Location: Right Arm, Patient Position: Sitting, Cuff Size: Normal)   Pulse 80   Temp 97.6 F (36.4 C) (Oral)  Resp (!) 22   Ht 5' 6  (1.676 m)   Wt 140 lb (63.5 kg)   SpO2 96% Comment: 2 L of O2  BMI 22.60 kg/m   Physical Exam  74 year old male in no acute distress, vital signs stable.  He presents today utilizing supplemental oxygen , 2 L. Cardiovascular exam with regular rate and rhythm Lungs with distant breath sounds in office today.  Assessment & Plan:   Primary hypertension Assessment & Plan: Blood pressure borderline in office today.  Can continue with current medication regimen including amlodipine  and losartan , no changes with medication today. Recommend intermittent monitoring blood pressure at home, DASH diet.  Orders: -     CBC with Differential/Platelet -     Comprehensive metabolic panel with GFR -     Lipid panel -     Iron , TIBC and Ferritin Panel  Dyslipidemia Assessment & Plan: Patient continues with atorvastatin , no issues with medication.  Can continue with medication at this time.  Will plan to check cholesterol panel with labs to assess current status  Orders: -     Lipid panel  Anemia, unspecified type Assessment & Plan: Normocytic on recent labs.  Patient with history of iron  deficiency and B12 deficiency in the past related to his history of Crohn's as well as intestinal resection in the past. We can plan to check labs including B12 and iron  levels to monitor for any current deficiencies.  Orders: -     CBC with Differential/Platelet -     Comprehensive metabolic panel with GFR -     Vitamin B12 -     Iron , TIBC and Ferritin Panel  B12 deficiency -     Vitamin B12  Return in about 3 months (around 12/30/2024) for hypertension, med check.   Spent 49 minutes on this patient encounter, including preparation, chart review, face-to-face counseling with patient and coordination of care, and documentation of encounter   ___________________________________________ Arnette Driggs de [place], MD, ABFM, CAQSM Primary Care and Sports Medicine [facility] "

## 2024-10-01 NOTE — Assessment & Plan Note (Signed)
 Patient continues with atorvastatin , no issues with medication.  Can continue with medication at this time.  Will plan to check cholesterol panel with labs to assess current status

## 2024-10-02 ENCOUNTER — Other Ambulatory Visit: Payer: Self-pay | Admitting: Family Medicine

## 2024-10-03 ENCOUNTER — Other Ambulatory Visit: Payer: Self-pay

## 2024-10-03 ENCOUNTER — Telehealth: Payer: Self-pay

## 2024-10-03 NOTE — Patient Instructions (Signed)
 Visit Information  Thank you for taking time to visit with me today. Please don't hesitate to contact me if I can be of assistance to you before our next scheduled telephone appointment.  Our next appointment is by telephone on Thursday January 15th at 2:00pm  Following is a copy of your care plan:   Goals Addressed             This Visit's Progress    VBCI Transitions of Care (TOC) Care Plan       Problems: (reviewed 10/04/23) Recent Hospitalization for treatment of COPD Hospital or ED Adm Risk is 82%  Goal: (reviewed 10/04/23) Over the next 30 days, the patient will not experience hospital readmission  Interventions: (reviewed 10/04/23)  COPD Interventions: Advised patient to track and manage COPD triggers Discussed the importance of adequate rest and management of fatigue with COPD Provided education about and advised patient to utilize infection prevention strategies to reduce risk of respiratory infection Screening for signs and symptoms of depression related to chronic disease state  Use of home oxygen  The patient was hospitalized with acute respiratory failure due to influenza. Take Tamiflu  and Prednisone  taper as directed Complete Pulmonary CT scan 09/12/24 Oxygen  at 2l/min continuous Wear Nicotine  patch as directed. The patient continues to smoke about four cigarettes a day HHPT with [facility] 10/03/24 - Completed PCP appointment 10/01/24. Lab ordered. The patient will complete 10/04/24  Patient Self Care Activities: (reviewed 10/04/23) Attend all scheduled provider appointments Call pharmacy for medication refills 3-7 days in advance of running out of medications Call provider office for new concerns or questions  Notify RN Care Manager of [facility] call rescheduling needs Participate in Transition of Care Program/Attend [facility] scheduled calls Perform all self care activities independently  Take medications as prescribed    Plan:  Telephone follow up appointment with care management  team member scheduled for:   Thursday January 8th at 2:00pm        Patient verbalizes understanding of instructions and care plan provided today and agrees to view in MyChart. Active MyChart status and patient understanding of how to access instructions and care plan via MyChart confirmed with patient.     The patient has been provided with contact information for the care management team and has been advised to call with any health related questions or concerns.   Please call the care guide team at [phone] if you need to cancel or reschedule your appointment.   Please call the Suicide and Crisis Lifeline: 988 call the [place]  National Suicide Prevention Lifeline: 2254644325 or TTY: 314 715 6582 TTY [phone]) to talk to a trained counselor if you are experiencing a Mental Health or Behavioral Health Crisis or need someone to talk to.  Medford Balboa, BSN, RN [place]  VBCI - [employer] [facility] RN Care Manager [phone]

## 2024-10-03 NOTE — Telephone Encounter (Signed)
 Requested OV notes faxed to [facility]

## 2024-10-03 NOTE — Telephone Encounter (Signed)
 The trelegy inhaler medicine lasts 24 hours compared to the breztri  medicine which lasts 12 hours. My recommendation would be to try the trelegy inhaler and if he doesn't like it then he can let us  know and we will get the breztri  approved once we can say he has tried the covered medication on his insurance formulary.  JD

## 2024-10-03 NOTE — Transitions of Care (Post Inpatient/ED Visit) (Signed)
 " Transition of Care week 4  Visit Note  10/03/2024  Name: Martin Santiago MRN: [id number]          DOB: 12/17/1950  Situation: Patient enrolled in [facility] 30-day program. Visit completed with Eryc Rozak by telephone.   Background:   Initial Transition Care Management Follow-up Telephone Call Discharge Date and Diagnosis: 09/10/24, Influenza   Past Medical History:  Diagnosis Date   AAA (abdominal aortic aneurysm)    9.8 cm 04/10/24 CTA   Allergy    Closed C1 fracture (HCC) 05/28/2023   Colon polyps    COPD (chronic obstructive pulmonary disease) (HCC)    Crohn's disease (HCC)    Detached retina    Emphysema of lung (HCC)    Essential hypertension    GERD (gastroesophageal reflux disease)    Oxygen  deficiency    Tropical sprue     Assessment: Patient Reported Symptoms: Cognitive Cognitive Status: Alert and oriented to person, place, and time, Normal speech and language skills      Neurological Neurological Review of Symptoms: No symptoms reported    HEENT HEENT Symptoms Reported: No symptoms reported      Cardiovascular Cardiovascular Symptoms Reported: No symptoms reported Cardiovascular Management Strategies: Medication therapy, Activity, Coping strategies, Routine screening  Respiratory Respiratory Symptoms Reported: Wheezing, Shortness of breath, Productive cough Additional Respiratory Details: Wheezing on inspiration. The patient was using portable oxygen  and needs to get back on his concentrator Respiratory Management Strategies: Routine screening, Medication therapy, Oxygen  therapy, Activity, Adequate rest  Endocrine Endocrine Symptoms Reported: No symptoms reported Is patient diabetic?: No    Gastrointestinal Gastrointestinal Symptoms Reported: No symptoms reported      Genitourinary Genitourinary Symptoms Reported: No symptoms reported    Integumentary Integumentary Symptoms Reported: No symptoms reported    Musculoskeletal Musculoskelatal Symptoms Reviewed:  Limited mobility, Unsteady gait, Weakness Additional Musculoskeletal Details: Electric scooter Musculoskeletal Management Strategies: Exercise Musculoskeletal Comment: HHPT      Psychosocial Psychosocial Symptoms Reported: No symptoms reported         There were no vitals filed for this visit.    Medications Reviewed Today     Reviewed by [name], RN (Case Manager) on 10/03/24 at 1510  Med List Status: <None>   Medication Order Taking? Sig Documenting Provider Last Dose Status Informant  albuterol  (PROVENTIL ) (2.5 MG/3ML) 0.083% nebulizer solution [id number] Yes Take 3 mLs (2.5 mg total) by nebulization every 4 (four) hours.  Patient taking differently: Take 2.5 mg by nebulization every 4 (four) hours as needed for wheezing or shortness of breath (COPD).   Kara Dorn NOVAK, MD  Active Self, Pharmacy Records  albuterol  (VENTOLIN  HFA) 108 [phone] Base) MCG/ACT inhaler [id number] Yes INHALE 2 PUFFS into THE lungs EVERY 6 HOURS AS NEEDED FOR wheezing OR SHORTNESS OF SHERIDA Kara Dorn NOVAK, MD  Active Self, Pharmacy Records  amLODipine  (NORVASC ) 10 MG tablet [id number]  TAKE 1 TABLET BY MOUTH ONCE DAILY (NEED TO MAKE AN APPOINTMENT WITH [facility]) *NO SUB UNICHEM* de [place], Raymond J, MD  Active   aspirin  EC 81 MG tablet [id number] Yes Take 1 tablet (81 mg total) by mouth daily. Swallow whole. Krasowski, Robert J, MD  Active Self, Pharmacy Records  atorvastatin  (LIPITOR) 20 MG tablet [id number] Yes Take 1 tablet (20 mg total) by mouth daily. Patient needs an appointment for further refills. 2 nd attempt Bernie Lamar PARAS, MD  Active Self, Pharmacy Records  azithromycin  (ZITHROMAX ) 250 MG tablet [id number] Yes Take 1 tablet (250 mg total) by mouth daily.  Kara Dorn NOVAK, MD  Active Self, Pharmacy Records  budesonide -glycopyrrolate -formoterol  (BREZTRI  AEROSPHERE) 160-9-4.8 MCG/ACT AERO inhaler [id number] Yes Inhale 2 puffs into the lungs in the morning and at bedtime. Kara Dorn NOVAK,  MD  Active   Ensifentrine  (OHTUVAYRE ) 3 MG/2.5ML SUSP [id number] Yes Inhale 3 mg into the lungs in the morning and at bedtime. Kara Dorn NOVAK, MD  Active Self, Pharmacy Records           Med Note [facility], [facility]   Mon Apr 22, 2024 10:55 AM)    losartan  (COZAAR ) 100 MG tablet [id number]  TAKE 1 TABLET BY MOUTH ONCE DAILY (please schedule office visit WITH Saguier) de [place], Quintin PARAS, MD  Active   nicotine  (NICODERM CQ  - DOSED IN MG/24 HOURS) 21 mg/24hr patch [id number] Yes Place 21 mg onto the skin daily. [provider]  Active Self, Pharmacy Records  omeprazole  (PRILOSEC) 20 MG capsule [id number] Yes Take 1 capsule (20 mg total) by mouth daily. Saguier, Edward, PA-C  Active Self, Pharmacy Records  OXYGEN  [id number] Yes Inhale 2 L/min into the lungs continuous. [provider]  Active Self, Pharmacy Records  Pseudoephedrine-Guaifenesin  (MUCINEX  D MAX STRENGTH) [phone] MG TB12 [id number]  Take 1 tablet by mouth every 12 (twelve) hours as needed (chest congestion). [provider]  Active   sildenafil  (REVATIO ) 20 MG tablet [id number] Yes 3-5 tab prior to sex.(Max use once in 24 hour period) Saguier, Dallas, PA-C  Active Self, Pharmacy Records  tamsulosin  (FLOMAX ) 0.4 MG CAPS capsule [id number] Yes Take 0.4 mg by mouth in the morning. [provider]  Active Self, Pharmacy Records  timolol  (TIMOPTIC ) 0.5 % ophthalmic solution [id number] Yes Place 1 drop into the right eye daily. [provider]  Active Self, Pharmacy Records            Recommendation:   Continue Current Plan of Care  Follow Up Plan:   Telephone follow-up in 1 week  Medford Balboa, BSN, RN [place]  VBCI - [facility] Health RN Care Manager [phone]     "

## 2024-10-04 ENCOUNTER — Other Ambulatory Visit: Payer: Self-pay

## 2024-10-04 MED ORDER — TRELEGY ELLIPTA 200-62.5-25 MCG/ACT IN AEPB: 1.0000 | INHALATION_SPRAY | Freq: Every day | RESPIRATORY_TRACT | 3 refills | Status: AC

## 2024-10-04 NOTE — Telephone Encounter (Signed)
 Spoke with patient Martin Santiago, will try trelegy . But still would like that extract lung boost with the extra puff

## 2024-10-07 ENCOUNTER — Other Ambulatory Visit: Payer: Self-pay

## 2024-10-07 DIAGNOSIS — J449 Chronic obstructive pulmonary disease, unspecified: Secondary | ICD-10-CM

## 2024-10-07 MED ORDER — OHTUVAYRE 3 MG/2.5ML IN SUSP: 3.0000 mg | Freq: Two times a day (BID) | RESPIRATORY_TRACT | 1 refills | Status: DC

## 2024-10-09 ENCOUNTER — Ambulatory Visit (HOSPITAL_BASED_OUTPATIENT_CLINIC_OR_DEPARTMENT_OTHER): Payer: Self-pay | Admitting: Family Medicine

## 2024-10-09 DIAGNOSIS — D649 Anemia, unspecified: Secondary | ICD-10-CM

## 2024-10-09 DIAGNOSIS — E611 Iron deficiency: Secondary | ICD-10-CM

## 2024-10-09 LAB — CBC WITH DIFFERENTIAL/PLATELET
Basophils Absolute: 0 x10E3/uL (ref 0.0–0.2)
Basos: 1 %
EOS (ABSOLUTE): 0.2 x10E3/uL (ref 0.0–0.4)
Eos: 5 %
Hematocrit: 39.3 % (ref 37.5–51.0)
Hemoglobin: 11.8 g/dL — ABNORMAL LOW (ref 13.0–17.7)
Immature Grans (Abs): 0 x10E3/uL (ref 0.0–0.1)
Immature Granulocytes: 0 %
Lymphocytes Absolute: 1 x10E3/uL (ref 0.7–3.1)
Lymphs: 24 %
MCH: 25.5 pg — ABNORMAL LOW (ref 26.6–33.0)
MCHC: 30 g/dL — ABNORMAL LOW (ref 31.5–35.7)
MCV: 85 fL (ref 79–97)
Monocytes Absolute: 0.7 x10E3/uL (ref 0.1–0.9)
Monocytes: 15 %
Neutrophils Absolute: 2.4 x10E3/uL (ref 1.4–7.0)
Neutrophils: 54 %
Platelets: 264 x10E3/uL (ref 150–450)
RBC: 4.63 x10E6/uL (ref 4.14–5.80)
RDW: 15.9 % — ABNORMAL HIGH (ref 11.6–15.4)
WBC: 4.4 x10E3/uL (ref 3.4–10.8)

## 2024-10-09 LAB — LIPID PANEL
Chol/HDL Ratio: 2.7 ratio (ref 0.0–5.0)
Cholesterol, Total: 154 mg/dL (ref 100–199)
HDL: 57 mg/dL
LDL Chol Calc (NIH): 87 mg/dL (ref 0–99)
Triglycerides: 46 mg/dL (ref 0–149)
VLDL Cholesterol Cal: 10 mg/dL (ref 5–40)

## 2024-10-09 LAB — IRON,TIBC AND FERRITIN PANEL
Ferritin: 25 ng/mL — ABNORMAL LOW (ref 30–400)
Iron Saturation: 8 % — CL (ref 15–55)
Iron: 27 ug/dL — ABNORMAL LOW (ref 38–169)
Total Iron Binding Capacity: 341 ug/dL (ref 250–450)
UIBC: 314 ug/dL (ref 111–343)

## 2024-10-09 LAB — COMPREHENSIVE METABOLIC PANEL WITH GFR
ALT: 7 IU/L (ref 0–44)
AST: 13 IU/L (ref 0–40)
Albumin: 3.9 g/dL (ref 3.8–4.8)
Alkaline Phosphatase: 88 IU/L (ref 47–123)
BUN/Creatinine Ratio: 16 (ref 10–24)
BUN: 15 mg/dL (ref 8–27)
Bilirubin Total: 0.3 mg/dL (ref 0.0–1.2)
CO2: 27 mmol/L (ref 20–29)
Calcium: 10.4 mg/dL — ABNORMAL HIGH (ref 8.6–10.2)
Chloride: 104 mmol/L (ref 96–106)
Creatinine, Ser: 0.96 mg/dL (ref 0.76–1.27)
Globulin, Total: 2.2 g/dL (ref 1.5–4.5)
Glucose: 89 mg/dL (ref 70–99)
Potassium: 4.9 mmol/L (ref 3.5–5.2)
Sodium: 144 mmol/L (ref 134–144)
Total Protein: 6.1 g/dL (ref 6.0–8.5)
eGFR: 82 mL/min/1.73

## 2024-10-09 LAB — VITAMIN B12: Vitamin B-12: 463 pg/mL (ref 232–1245)

## 2024-10-09 MED ORDER — IRON (FERROUS SULFATE) 325 (65 FE) MG PO TABS: 325.0000 mg | ORAL_TABLET | ORAL | 1 refills | Status: AC

## 2024-10-10 ENCOUNTER — Other Ambulatory Visit: Payer: Self-pay

## 2024-10-10 NOTE — Patient Instructions (Signed)
 Visit Information  Thank you for taking time to visit with me today. Please don't hesitate to contact me if I can be of assistance to you before our next scheduled telephone appointment.   Following is a copy of your care plan:   Goals Addressed             This Visit's Progress    VBCI Transitions of Care (TOC) Care Plan       Problems: (reviewed 10/11/23) Recent Hospitalization for treatment of COPD Hospital or ED Adm Risk is 82%  Goal: (reviewed 10/11/23) Over the next 30 days, the patient will not experience hospital readmission  Interventions: (reviewed 10/11/23)  COPD Interventions: Advised patient to track and manage COPD triggers Discussed the importance of adequate rest and management of fatigue with COPD Provided education about and advised patient to utilize infection prevention strategies to reduce risk of respiratory infection Screening for signs and symptoms of depression related to chronic disease state  Use of home oxygen  The patient was hospitalized with acute respiratory failure due to influenza. Take Tamiflu  and Prednisone  taper as directed Complete Pulmonary CT scan 09/12/24 Oxygen  at 2l/min continuous Wear Nicotine  patch as directed. The patient continues to smoke about four cigarettes a day HHPT with [facility] 10/03/24 - Completed PCP appointment 10/01/24. Lab ordered. The patient will complete 10/04/24 - completed   Patient Self Care Activities: (reviewed 10/11/23) Attend all scheduled provider appointments Call pharmacy for medication refills 3-7 days in advance of running out of medications Call provider office for new concerns or questions  Notify RN Care Manager of [facility] call rescheduling needs Participate in Transition of Care Program/Attend TOC scheduled calls Perform all self care activities independently  Take medications as prescribed    Plan:  Telephone follow up appointment with care management team member scheduled for:  The patient has met his  goals. He will be discharged from the [facility] program        Patient verbalizes understanding of instructions and care plan provided today and agrees to view in MyChart. Active MyChart status and patient understanding of how to access instructions and care plan via MyChart confirmed with patient.     The patient has been provided with contact information for the care management team and has been advised to call with any health related questions or concerns.   Please call the care guide team at [phone] if you need to cancel or reschedule your appointment.   Please call the Suicide and Crisis Lifeline: 988 call the [place]  National Suicide Prevention Lifeline: (928)053-0109 or TTY: (903)111-1805 TTY [phone]) to talk to a trained counselor if you are experiencing a Mental Health or Behavioral Health Crisis or need someone to talk to.  Medford Balboa, BSN, RN [place]  VBCI - [employer] [facility] RN Care Manager [phone]

## 2024-10-10 NOTE — Transitions of Care (Post Inpatient/ED Visit) (Signed)
 " Transition of Care week 5  Visit Note  10/10/2024  Name: Martin Santiago MRN: [id number]          DOB: 07/10/1951  Situation: Patient enrolled in [facility] 30-day program. Visit completed with [name] by telephone.   Background:   Initial Transition Care Management Follow-up Telephone Call Discharge Date and Diagnosis: 09/10/24, Influenza   Past Medical History:  Diagnosis Date   AAA (abdominal aortic aneurysm)    9.8 cm 04/10/24 CTA   Allergy    Closed C1 fracture (HCC) 05/28/2023   Colon polyps    COPD (chronic obstructive pulmonary disease) (HCC)    Crohn's disease (HCC)    Detached retina    Emphysema of lung (HCC)    Essential hypertension    GERD (gastroesophageal reflux disease)    Oxygen  deficiency    Tropical sprue     Assessment: Patient Reported Symptoms: Cognitive Cognitive Status: Alert and oriented to person, place, and time, Normal speech and language skills      Neurological Neurological Review of Symptoms: No symptoms reported    HEENT HEENT Symptoms Reported: No symptoms reported      Cardiovascular Cardiovascular Symptoms Reported: No symptoms reported Cardiovascular Management Strategies: Medication therapy, Routine screening, Coping strategies Weight: 140 lb (63.5 kg)  Respiratory Respiratory Symptoms Reported: Wheezing, Shortness of breath, Productive cough Additional Respiratory Details: Using his oxygen  continuous. He uses his scooter for mobility Respiratory Management Strategies: Routine screening, Medication therapy, Oxygen  therapy, Activity, Adequate rest  Endocrine Endocrine Symptoms Reported: No symptoms reported    Gastrointestinal Gastrointestinal Symptoms Reported: No symptoms reported      Genitourinary Genitourinary Symptoms Reported: No symptoms reported    Integumentary Integumentary Symptoms Reported: No symptoms reported    Musculoskeletal Musculoskelatal Symptoms Reviewed: Unsteady gait, Limited mobility Additional  Musculoskeletal Details: He uses an art gallery manager Musculoskeletal Management Strategies: Exercise Musculoskeletal Comment: Finishin up HHPT      Psychosocial Psychosocial Symptoms Reported: No symptoms reported         Today's Vitals   10/10/24 1413  Weight: 140 lb (63.5 kg)      Medications Reviewed Today     Reviewed by [name], RN (Case Manager) on 10/10/24 at 1409  Med List Status: <None>   Medication Order Taking? Sig Documenting Provider Last Dose Status Informant  albuterol  (PROVENTIL ) (2.5 MG/3ML) 0.083% nebulizer solution [id number]  Take 3 mLs (2.5 mg total) by nebulization every 4 (four) hours.  Patient taking differently: Take 2.5 mg by nebulization every 4 (four) hours as needed for wheezing or shortness of breath (COPD).   Kara Dorn NOVAK, MD  Active Self, Pharmacy Records  albuterol  (VENTOLIN  HFA) 108 [phone] Base) MCG/ACT inhaler [id number]  INHALE 2 PUFFS into THE lungs EVERY 6 HOURS AS NEEDED FOR wheezing OR SHORTNESS OF SHERIDA Kara Dorn NOVAK, MD  Active Self, Pharmacy Records  amLODipine  (NORVASC ) 10 MG tablet [id number]  TAKE 1 TABLET BY MOUTH ONCE DAILY (NEED TO MAKE AN APPOINTMENT WITH SAGUIER) *NO SUB UNICHEM* de [place], Raymond J, MD  Active   aspirin  EC 81 MG tablet [id number]  Take 1 tablet (81 mg total) by mouth daily. Swallow whole. Krasowski, Robert J, MD  Active Self, Pharmacy Records  atorvastatin  (LIPITOR) 20 MG tablet [id number]  Take 1 tablet (20 mg total) by mouth daily. Patient needs an appointment for further refills. 2 nd attempt Bernie Lamar PARAS, MD  Active Self, Pharmacy Records  azithromycin  (ZITHROMAX ) 250 MG tablet [id number]  Take 1 tablet (250 mg total)  by mouth daily. Kara Dorn NOVAK, MD  Active Self, Pharmacy Records  budesonide -glycopyrrolate -formoterol  (BREZTRI  AEROSPHERE) 160-9-4.8 MCG/ACT AERO inhaler [id number]  Inhale 2 puffs into the lungs in the morning and at bedtime.  Patient not taking: Reported on 10/10/2024    Kara Dorn NOVAK, MD  Active   Ensifentrine  (OHTUVAYRE ) 3 MG/2.5ML SUSP [id number]  Inhale 3 mg into the lungs in the morning and at bedtime. Kara Dorn NOVAK, MD  Active   Fluticasone-Umeclidin-Vilant (TRELEGY ELLIPTA ) 200-62.5-25 MCG/ACT AEPB [id number] Yes Inhale 1 puff into the lungs daily. Kara Dorn NOVAK, MD  Active   Iron , Ferrous Sulfate , 325 (65 Fe) MG TABS [id number]  Take 325 mg by mouth every Monday, Wednesday, and Friday. de [place], Raymond J, MD  Active   losartan  (COZAAR ) 100 MG tablet [id number]  TAKE 1 TABLET BY MOUTH ONCE DAILY (please schedule office visit WITH Saguier) de [place], Quintin PARAS, MD  Active   nicotine  (NICODERM CQ  - DOSED IN MG/24 HOURS) 21 mg/24hr patch [id number]  Place 21 mg onto the skin daily.  Patient not taking: Reported on 10/10/2024   [provider]  Active Self, Pharmacy Records  omeprazole  (PRILOSEC) 20 MG capsule [id number]  Take 1 capsule (20 mg total) by mouth daily. Saguier, Edward, PA-C  Active Self, Pharmacy Records  OXYGEN  [id number]  Inhale 2 L/min into the lungs continuous. [provider]  Active Self, Pharmacy Records  Pseudoephedrine-Guaifenesin  (MUCINEX  D MAX STRENGTH) [phone] MG TB12 [id number]  Take 1 tablet by mouth every 12 (twelve) hours as needed (chest congestion). [provider]  Active   sildenafil  (REVATIO ) 20 MG tablet [id number]  3-5 tab prior to sex.(Max use once in 24 hour period) Saguier, Dallas, PA-C  Active Self, Pharmacy Records  tamsulosin  (FLOMAX ) 0.4 MG CAPS capsule [id number]  Take 0.4 mg by mouth in the morning. [provider]  Active Self, Pharmacy Records  timolol  (TIMOPTIC ) 0.5 % ophthalmic solution [id number]  Place 1 drop into the right eye daily. [provider]  Active Self, Pharmacy Records            Recommendation:   Goals met. The patient is discharged from the [facility] program  Follow Up Plan:   Closing From:  Transitions of Care Program  [facility], BSN, RN [facility]  VBCI - [facility] RN Care Manager [phone]     "

## 2024-10-22 ENCOUNTER — Telehealth: Payer: Self-pay

## 2024-10-22 NOTE — Telephone Encounter (Signed)
 Has patient gone through the [place] pathway to get his Ohtuvayre  covered?  Pharmacy team, can we file a prior auth for breztri  since he prefers this over trelegy. Unless there is a combination of ICS/LABA + LAMA inhalers we need to prescribe that is more cost effect.  Thanks, JD

## 2024-10-22 NOTE — Telephone Encounter (Unsigned)
 Copied from CRM #[id number]. Topic: Clinical - Prescription Issue >> Oct 21, 2024 11:37 AM Benton KIDD wrote: Reason for CRM: patient is calling because he says he tried to order the Ensifentrine  (OHTUVAYRE ) 3 MG/2.5ML SUSP [[id number]] but the insurance does not cover it and he tried to order his breztri  but insurance does not cover that either . Patient says the breztri  medication has been the most helpful with his copd . Patient does not like the trellegy its just ok not entirely happy with it . Patient says he does not think it should be major changes to his medication at the moment when he was just in the hospital for 3 days a month ago dealing with breathing issues . Patient is asking what can be done from here . Please reach out to patient concerning his issue with the medication and getting help to see what can be done  [phone]

## 2024-10-23 ENCOUNTER — Telehealth: Payer: Self-pay

## 2024-10-23 NOTE — Telephone Encounter (Signed)
 Sent to Appeals Rph to take a look.

## 2024-10-23 NOTE — Telephone Encounter (Signed)
 Copied from CRM #[id number]. Topic: Clinical - Prescription Issue >> Oct 21, 2024 11:37 AM Martin Santiago wrote: Reason for CRM: patient is calling because he says he tried to order the Ensifentrine  (OHTUVAYRE ) 3 MG/2.5ML SUSP [[id number]] but the insurance does not cover it and he tried to order his breztri  but insurance does not cover that either . Patient says the breztri  medication has been the most helpful with his copd . Patient does not like the trellegy its just ok not entirely happy with it . Patient says he does not think it should be major changes to his medication at the moment when he was just in the hospital for 3 days a month ago dealing with breathing issues . Patient is asking what can be done from here . Please reach out to patient concerning his issue with the medication and getting help to see what can be done  [phone] >> Oct 23, 2024  3:51 PM Martin Santiago wrote: Patient would like to be updated on current process of PA, as he is very concerned about Breztri  and having to change prescriptions when he is on the mend from recent hospital visits. Patient informed of last status, where appeal was submitted and note form 10/23/24 around 3:00 PM from [place]. When an update is available, please reach out to patient to keep in the loop, as patient is concerned about next steps of care.   Patient Additional Comment: Patient feels was forced to try it due to insurance issues and had to pay [phone] for it. Patient feels he has gone above and beyond with 'trying' alternatives, especially financially.   Patient requesting that IF Breztri  is able to be refilled, requesting it go to [facility] on file.

## 2024-10-23 NOTE — Telephone Encounter (Unsigned)
 Copied from CRM #[id number]. Topic: Clinical - Prescription Issue >> Oct 21, 2024 11:37 AM Benton KIDD wrote: Reason for CRM: patient is calling because he says he tried to order the Ensifentrine  (OHTUVAYRE ) 3 MG/2.5ML SUSP [[id number]] but the insurance does not cover it and he tried to order his breztri  but insurance does not cover that either . Patient says the breztri  medication has been the most helpful with his copd . Patient does not like the trellegy its just ok not entirely happy with it . Patient says he does not think it should be major changes to his medication at the moment when he was just in the hospital for 3 days a month ago dealing with breathing issues . Patient is asking what can be done from here . Please reach out to patient concerning his issue with the medication and getting help to see what can be done  [phone] >> Oct 23, 2024  3:51 PM Joesph PARAS wrote: Patient would like to be updated on current process of PA, as he is very concerned about Breztri  and having to change prescriptions when he is on the mend from recent hospital visits. Patient informed of last status, where appeal was submitted and note form 10/23/24 around 3:00 PM from [place]. When an update is available, please reach out to patient to keep in the loop, as patient is concerned about next steps of care.   Patient Additional Comment: Patient feels was forced to try it due to insurance issues and had to pay [phone] for it. Patient feels he has gone above and beyond with 'trying' alternatives, especially financially.   Patient requesting that IF Breztri  is able to be refilled, requesting it go to [facility] on file.

## 2024-10-23 NOTE — Telephone Encounter (Signed)
"   Insurance has denied Breztri  because patient has not tried and failed the preferred products, including Breo Ellipta, Dulera, Advair Diskus, and Advair HFA. To proceed with an appeal, clinical rationale is required explaining why the patient is unable to try these preferred therapies, as patient preference alone is typically not considered a sufficient clinical reason for approval. While these agents are not triple therapy, a LAMA inhaler (such as Spiriva  or Incruse) could be prescribed in combination with a preferred product. Please advise on how you would like to proceed. "

## 2024-10-23 NOTE — Telephone Encounter (Signed)
*  Pulm  Pharmacy Patient Advocate Encounter   Received notification from Pt Calls Messages that prior authorization for Breztri  is required/requested.   Insurance verification completed.   The patient is insured through [facility].   Per test claim: PA required; PA submitted to above mentioned insurance via Latent Key/confirmation #/EOC [id number] Status is pending

## 2024-10-23 NOTE — Telephone Encounter (Signed)
 Denied. Patient must try and fail Breo Ellipta, Dulera, Advair Diskus, Advair HFA

## 2024-10-23 NOTE — Telephone Encounter (Signed)
 Ok to stop if he has not noted benefit.  Thanks, JD

## 2024-10-23 NOTE — Telephone Encounter (Signed)
 Copied from CRM #[id number]. Topic: Clinical - Prescription Issue >> Oct 21, 2024 11:37 AM Benton KIDD wrote: Reason for CRM: patient is calling because he says he tried to order the Ensifentrine  (OHTUVAYRE ) 3 MG/2.5ML SUSP [[id number]] but the insurance does not cover it and he tried to order his breztri  but insurance does not cover that either . Patient says the breztri  medication has been the most helpful with his copd . Patient does not like the trellegy its just ok not entirely happy with it . Patient says he does not think it should be major changes to his medication at the moment when he was just in the hospital for 3 days a month ago dealing with breathing issues . Patient is asking what can be done from here . Please reach out to patient concerning his issue with the medication and getting help to see what can be done  [phone] >> Oct 23, 2024  3:51 PM Joesph PARAS wrote: Patient would like to be updated on current process of PA, as he is very concerned about Breztri  and having to change prescriptions when he is on the mend from recent hospital visits. Patient informed of last status, where appeal was submitted and note form 10/23/24 around 3:00 PM from [place]. When an update is available, please reach out to patient to keep in the loop, as patient is concerned about next steps of care.   Patient Additional Comment: Patient feels was forced to try it due to insurance issues and had to pay [phone] for it. Patient feels he has gone above and beyond with 'trying' alternatives, especially financially.   Patient requesting that IF Breztri  is able to be refilled, requesting it go to [facility] on file.    Spoke with patient sent infi to pharmacy was told that someone was working on it and sent message to MD on patient concern about  Ohtuvayre 

## 2024-10-24 ENCOUNTER — Telehealth: Payer: Self-pay | Admitting: Pharmacist

## 2024-10-24 NOTE — Telephone Encounter (Signed)
 In order to complete an appeal for Breztri , please provide clinical rationale as to why the patient is unable to try the preferred medications:  Patient must try and fail Breo Ellipta, Dulera, Advair Diskus, Advair HFA    Thank you, Devere Pandy, PharmD Clinical Pharmacist  [facility]  Direct Dial: [phone]

## 2024-10-24 NOTE — Telephone Encounter (Signed)
 Patient was on bevespi  inhaler prior to transition to Breztri  which occurred in the fall or 2022. He has been on breztri  inhaler since. He requires triple inhaler therapy with ICS/LABA/LAMA therapy. He has tried trelegy in the past and feels breztri  works best for him.  Thanks, Dorn Chill, MD [facility] Office: [phone]   See Amion for personal pager PCCM on call pager [phone] until 7pm. Please call Elink 7p-7a. [phone]

## 2024-10-24 NOTE — Telephone Encounter (Signed)
 Good afternoon, Please see pharmacy note regarding appeal for Breztri .  Thank you.

## 2024-10-25 ENCOUNTER — Other Ambulatory Visit (HOSPITAL_COMMUNITY): Payer: Self-pay

## 2024-10-25 NOTE — Telephone Encounter (Signed)
 E-Appeal has been submitted. Will advise when response is received, please be advised that most companies may take 30 days to make a decision. Appeal letter and supporting clinical documentation have been uploaded and submitted via CMM on 10/25/2024 @11 :50 am.   Thank you, Devere Pandy, PharmD Clinical Pharmacist  [place]  Direct Dial: [phone]

## 2024-10-25 NOTE — Telephone Encounter (Signed)
 thanks

## 2024-10-28 ENCOUNTER — Other Ambulatory Visit (HOSPITAL_COMMUNITY): Payer: Self-pay

## 2024-10-28 NOTE — Telephone Encounter (Signed)
 Additional information has been requested from the patient's insurance in order to proceed with the appeal request. Requested information has been sent, or form has been filled out and faxed back to [phone]

## 2024-10-29 ENCOUNTER — Other Ambulatory Visit: Payer: Self-pay

## 2024-10-29 DIAGNOSIS — J432 Centrilobular emphysema: Secondary | ICD-10-CM

## 2024-10-29 MED ORDER — BREZTRI AEROSPHERE 160-9-4.8 MCG/ACT IN AERO: 2.0000 | INHALATION_SPRAY | Freq: Two times a day (BID) | RESPIRATORY_TRACT | 5 refills | Status: AC

## 2024-10-29 NOTE — Telephone Encounter (Signed)
 See 1/29 encounter for more info. NFN

## 2024-10-30 ENCOUNTER — Ambulatory Visit (HOSPITAL_COMMUNITY)
Admission: RE | Admit: 2024-10-30 | Discharge: 2024-10-30 | Disposition: A | Source: Ambulatory Visit | Attending: Surgery | Admitting: Surgery

## 2024-10-30 ENCOUNTER — Ambulatory Visit: Admitting: Physician Assistant

## 2024-10-30 VITALS — BP 141/92 | HR 89 | Ht 66.0 in | Wt 140.5 lb

## 2024-10-30 DIAGNOSIS — I7143 Infrarenal abdominal aortic aneurysm, without rupture: Secondary | ICD-10-CM | POA: Diagnosis not present

## 2024-10-30 DIAGNOSIS — Z8679 Personal history of other diseases of the circulatory system: Secondary | ICD-10-CM

## 2024-10-30 DIAGNOSIS — Z9889 Other specified postprocedural states: Secondary | ICD-10-CM

## 2024-10-30 NOTE — Progress Notes (Signed)
 " Office Note     CC:  follow up Requesting Provider:  Dorina Loving, PA-C  HPI: CORDE ANTONINI is a 74 y.o. (06/18/1951) male who presents for surveillance of endovascular repair of 10 cm abdominal aortic aneurysm involving placement of Endo anchors with a proximal cuff and cut down for repair of the common femoral artery by Dr. Sheree on 04/12/2024.  He was discharged home on postoperative day #2.  He is on oxygen  24/7.  He denies any new or changing abdominal or back pain.  He smokes about 1/4 pack a day.  He is on aspirin  and statin daily.   Past Medical History:  Diagnosis Date   AAA (abdominal aortic aneurysm)    9.8 cm 04/10/24 CTA   Allergy    Closed C1 fracture (HCC) 05/28/2023   Colon polyps    COPD (chronic obstructive pulmonary disease) (HCC)    Crohn's disease (HCC)    Detached retina    Emphysema of lung (HCC)    Essential hypertension    GERD (gastroesophageal reflux disease)    Oxygen  deficiency    Tropical sprue     Past Surgical History:  Procedure Laterality Date   ABDOMINAL AORTIC ENDOVASCULAR STENT GRAFT N/A 04/12/2024   Procedure: INSERTION, ENDOVASCULAR STENT GRAFT, AORTA, ABDOMINAL;  Surgeon: Sheree Penne Bruckner, MD;  Location: [facility] OR;  Service: Vascular;  Laterality: N/A;   APPENDECTOMY     BOWEL RESECTION  05/27/1994   [place]:  ? Crohn's   COLON SURGERY     COLONOSCOPY     Dr Karena, [facility]:  Hx polyps. Cannot remmeber date   COLONOSCOPY N/A 12/03/2012   Procedure: COLONOSCOPY;  Surgeon: Lamar CHRISTELLA Hollingshead, MD;  Location: [facility];  Service: Endoscopy;  Laterality: N/A;  7:30   EYE SURGERY     INGUINAL HERNIA REPAIR Right    REPAIR ILIAC ARTERY Right 04/12/2024   Procedure: REPAIR, ARTERY, FEMORAL;  Surgeon: Sheree Penne Bruckner, MD;  Location: [facility] OR;  Service: Vascular;  Laterality: Right;   RETINAL DETACHMENT SURGERY  07/27/2006   RETINAL DETACHMENT SURGERY  09/26/2006   RETINAL DETACHMENT SURGERY  01/25/2007   SMALL INTESTINE  SURGERY     TONSILLECTOMY     ULTRASOUND GUIDANCE FOR VASCULAR ACCESS Bilateral 04/12/2024   Procedure: ULTRASOUND GUIDANCE, FOR VASCULAR ACCESS;  Surgeon: Sheree Penne Bruckner, MD;  Location: [facility];  Service: Vascular;  Laterality: Bilateral;    Social History   Socioeconomic History   Marital status: Widowed    Spouse name: Not on file   Number of children: 0   Years of education: Not on file   Highest education level: Bachelor's degree (e.g., BA, AB, BS)  Occupational History   Occupation: retired, counsellor, Engineer, Agricultural: SELF EMPLOYED  Tobacco Use   Smoking status: Every Day    Current packs/day: 2.00    Average packs/day: 2.0 packs/day for 55.0 years (110.0 ttl pk-yrs)    Types: Cigarettes    Passive exposure: Past   Smokeless tobacco: Never   Tobacco comments:    Smoke 1/4 pack a day. Tay 04/19/22    Smoking 3-4 cigarettes/day TD 10/30/2024  Vaping Use   Vaping status: Never Used  Substance and Sexual Activity   Alcohol use: Not Currently    Comment: Rare, 3-4 per yr   Drug use: No    Comment: Remote marijuana   Sexual activity: Yes    Birth control/protection: None  Other Topics Concern   Not  on file  Social History Narrative   Wife, Devere passed away in 12/14/2021 from [place].   1 step-daughter who lives in [place].   Social Drivers of Health   Tobacco Use: High Risk (10/30/2024)   Patient History    Smoking Tobacco Use: Every Day    Smokeless Tobacco Use: Never    Passive Exposure: Past  Financial Resource Strain: Low Risk (01/18/2024)   Overall Financial Resource Strain (CARDIA)    Difficulty of Paying Living Expenses: Not hard at all  Food Insecurity: No Food Insecurity (09/11/2024)   Epic    Worried About Programme Researcher, Broadcasting/film/video in the Last Year: Never true    Ran Out of Food in the Last Year: Never true  Transportation Needs: No Transportation Needs (09/11/2024)   Epic    Lack of Transportation (Medical): No    Lack of  Transportation (Non-Medical): No  Physical Activity: Inactive (01/18/2024)   Exercise Vital Sign    Days of Exercise per Week: 0 days    Minutes of Exercise per Session: 0 min  Stress: No Stress Concern Present (01/18/2024)   [employer] of Occupational Health - Occupational Stress Questionnaire    Feeling of Stress : Not at all  Social Connections: Moderately Isolated (09/07/2024)   Social Connection and Isolation Panel    Frequency of Communication with Friends and Family: More than three times a week    Frequency of Social Gatherings with Friends and Family: More than three times a week    Attends Religious Services: 1 to 4 times per year    Active Member of [employer] or Organizations: No    Attends Banker Meetings: Not on file    Marital Status: Widowed  Intimate Partner Violence: Not At Risk (09/11/2024)   Epic    Fear of Current or Ex-Partner: No    Emotionally Abused: No    Physically Abused: No    Sexually Abused: No  Depression (PHQ2-9): Low Risk (06/12/2024)   Depression (PHQ2-9)    PHQ-2 Score: 3  Alcohol Screen: Low Risk (01/18/2024)   Alcohol Screen    Last Alcohol Screening Score (AUDIT): 0  Housing: Unknown (09/11/2024)   Epic    Unable to Pay for Housing in the Last Year: No    Number of Times Moved in the Last Year: Not on file    Homeless in the Last Year: No  Utilities: Not At Risk (09/11/2024)   Epic    Threatened with loss of utilities: No  Health Literacy: Adequate Health Literacy (01/18/2024)   B1300 Health Literacy    Frequency of need for help with medical instructions: Never    Family History  Problem Relation Age of Onset   Colon cancer Maternal Aunt    Breast cancer Mother    Hypertension Father     Current Outpatient Medications  Medication Sig Dispense Refill   albuterol  (PROVENTIL ) (2.5 MG/3ML) 0.083% nebulizer solution Take 3 mLs (2.5 mg total) by nebulization every 4 (four) hours. (Patient taking differently: Take 2.5 mg by  nebulization every 4 (four) hours as needed for wheezing or shortness of breath (COPD).) 540 mL 11   albuterol  (VENTOLIN  HFA) 108 (90 Base) MCG/ACT inhaler INHALE 2 PUFFS into THE lungs EVERY 6 HOURS AS NEEDED FOR wheezing OR SHORTNESS OF BREATH 18 g 8   amLODipine  (NORVASC ) 10 MG tablet TAKE 1 TABLET BY MOUTH ONCE DAILY (NEED TO MAKE AN APPOINTMENT WITH [facility]) *NO SUB UNICHEM* 90 tablet 3  aspirin  EC 81 MG tablet Take 1 tablet (81 mg total) by mouth daily. Swallow whole.     atorvastatin  (LIPITOR) 20 MG tablet Take 1 tablet (20 mg total) by mouth daily. Patient needs an appointment for further refills. 2 nd attempt 15 tablet 0   azithromycin  (ZITHROMAX ) 250 MG tablet Take 1 tablet (250 mg total) by mouth daily. 30 tablet 5   budesonide -glycopyrrolate -formoterol  (BREZTRI  AEROSPHERE) 160-9-4.8 MCG/ACT AERO inhaler Inhale 2 puffs into the lungs in the morning and at bedtime. 10.7 g 5   Fluticasone-Umeclidin-Vilant (TRELEGY ELLIPTA ) 200-62.5-25 MCG/ACT AEPB Inhale 1 puff into the lungs daily. 28 each 3   Iron , Ferrous Sulfate , 325 (65 Fe) MG TABS Take 325 mg by mouth every Monday, Wednesday, and Friday. 30 tablet 1   losartan  (COZAAR ) 100 MG tablet TAKE 1 TABLET BY MOUTH ONCE DAILY (please schedule office visit WITH [facility]) 90 tablet 3   nicotine  (NICODERM CQ  - DOSED IN MG/24 HOURS) 21 mg/24hr patch Place 21 mg onto the skin daily.     omeprazole  (PRILOSEC) 20 MG capsule Take 1 capsule (20 mg total) by mouth daily. 90 capsule 0   OXYGEN  Inhale 2 L/min into the lungs continuous.     Pseudoephedrine-Guaifenesin  (MUCINEX  D MAX STRENGTH) 239 [id number] 4180 MG TB12 Take 1 tablet by mouth every 12 (twelve) hours as needed (chest congestion).     sildenafil  (REVATIO ) 20 MG tablet 3-5 tab prior to sex.(Max use once in 24 hour period) 50 tablet 0   tamsulosin  (FLOMAX ) 0.4 MG CAPS capsule Take 0.4 mg by mouth in the morning.     timolol  (TIMOPTIC ) 0.5 % ophthalmic solution Place 1 drop into the right eye daily.      No current facility-administered medications for this visit.    Allergies[1]   REVIEW OF SYSTEMS:  Negative unless noted in HPI [X]  denotes positive finding, [ ]  denotes negative finding Cardiac  Comments:  Chest pain or chest pressure:    Shortness of breath upon exertion:    Short of breath when lying flat:    Irregular heart rhythm:        Vascular    Pain in calf, thigh, or hip brought on by ambulation:    Pain in feet at night that wakes you up from your sleep:     Blood clot in your veins:    Leg swelling:         Pulmonary    Oxygen  at home:    Productive cough:     Wheezing:         Neurologic    Sudden weakness in arms or legs:     Sudden numbness in arms or legs:     Sudden onset of difficulty speaking or slurred speech:    Temporary loss of vision in one eye:     Problems with dizziness:         Gastrointestinal    Blood in stool:     Vomited blood:         Genitourinary    Burning when urinating:     Blood in urine:        Psychiatric    Major depression:         Hematologic    Bleeding problems:    Problems with blood clotting too easily:        Skin    Rashes or ulcers:        Constitutional    Fever or chills:      PHYSICAL  EXAMINATION:  Vitals:   10/30/24 0908  BP: (!) 141/92  Pulse: 89  Weight: 140 lb 8 oz (63.7 kg)  Height: 5' 6 (1.676 m)    General:  WDWN in NAD; vital signs documented above Gait: Not observed HENT: WNL, normocephalic Pulmonary: normal non-labored breathing Cardiac: regular HR Abdomen: soft, NT, no masses Skin: without rashes Vascular Exam/Pulses: brisk DP signal by doppler BLE Extremities: without ischemic changes, without Gangrene , without cellulitis; without open wounds;  Musculoskeletal: no muscle wasting or atrophy  Neurologic: A&O X 3 Psychiatric:  The pt has Normal affect.   Non-Invasive Vascular Imaging:   4 cm AAA sac without evidence of endoleak    ASSESSMENT/PLAN:: 74 y.o. male  here for follow up for surveillance of endovascular repair of abdominal aortic aneurysm  Mr. Willhoite is a 74 year old male who underwent endovascular repair of a 10 cm abdominal aortic aneurysm which involved a proximal cuff with Endo anchors and cutdown for repair of the right common femoral artery.  Duplex demonstrates a shrinking AAA sac now measuring 4 cm.  No evidence of endoleak on duplex.  Continue aspirin  and statin daily.  We will repeat EVAR duplex in 1 year.   Donnice Sender, PA-C [facility] [phone]  Clinic MD:   Sheree     [1]  Allergies Allergen Reactions   Codeine Nausea And Vomiting and Other (See Comments)    Other reaction(s): Flushing (ALLERGY/intolerance) fainting   "

## 2024-11-18 ENCOUNTER — Ambulatory Visit: Admitting: Pulmonary Disease

## 2024-12-30 ENCOUNTER — Ambulatory Visit (HOSPITAL_BASED_OUTPATIENT_CLINIC_OR_DEPARTMENT_OTHER): Admitting: Family Medicine

## 2025-01-23 ENCOUNTER — Ambulatory Visit
